# Patient Record
Sex: Female | Born: 1975
Health system: Southern US, Community
[De-identification: ages and names within clinical notes are randomized; demographics above are authoritative.]

## PROBLEM LIST (undated history)

## (undated) DIAGNOSIS — R569 Unspecified convulsions: Secondary | ICD-10-CM

## (undated) DIAGNOSIS — G40909 Epilepsy, unspecified, not intractable, without status epilepticus: Secondary | ICD-10-CM

## (undated) DIAGNOSIS — T7840XA Allergy, unspecified, initial encounter: Secondary | ICD-10-CM

## (undated) DIAGNOSIS — F32A Depression, unspecified: Secondary | ICD-10-CM

## (undated) DIAGNOSIS — F329 Major depressive disorder, single episode, unspecified: Secondary | ICD-10-CM

## (undated) DIAGNOSIS — K219 Gastro-esophageal reflux disease without esophagitis: Secondary | ICD-10-CM

## (undated) DIAGNOSIS — N809 Endometriosis, unspecified: Secondary | ICD-10-CM

## (undated) DIAGNOSIS — F419 Anxiety disorder, unspecified: Secondary | ICD-10-CM

## (undated) DIAGNOSIS — N390 Urinary tract infection, site not specified: Secondary | ICD-10-CM

## (undated) DIAGNOSIS — Z8669 Personal history of other diseases of the nervous system and sense organs: Secondary | ICD-10-CM

## (undated) HISTORY — PX: UMBILICAL HERNIA REPAIR: SHX2598

## (undated) HISTORY — DX: Endometriosis, unspecified: N80.9

## (undated) HISTORY — DX: Anxiety disorder, unspecified: F41.9

## (undated) HISTORY — PX: KNEE SURGERY: SHX244

## (undated) HISTORY — DX: Urinary tract infection, site not specified: N39.0

## (undated) HISTORY — PX: ABDOMINAL HYSTERECTOMY: SHX81

## (undated) HISTORY — DX: Allergy, unspecified, initial encounter: T78.40XA

## (undated) HISTORY — PX: WISDOM TOOTH EXTRACTION: SHX21

## (undated) HISTORY — PX: HERNIA REPAIR: SHX51

---

## 2002-09-05 ENCOUNTER — Emergency Department (HOSPITAL_COMMUNITY): Admission: EM | Admit: 2002-09-05 | Discharge: 2002-09-05 | Payer: Self-pay | Admitting: Emergency Medicine

## 2002-09-05 ENCOUNTER — Encounter: Payer: Self-pay | Admitting: Emergency Medicine

## 2003-03-07 ENCOUNTER — Emergency Department (HOSPITAL_COMMUNITY): Admission: EM | Admit: 2003-03-07 | Discharge: 2003-03-07 | Payer: Self-pay | Admitting: Emergency Medicine

## 2003-12-16 ENCOUNTER — Other Ambulatory Visit: Admission: RE | Admit: 2003-12-16 | Discharge: 2003-12-16 | Payer: Self-pay | Admitting: *Deleted

## 2004-08-08 ENCOUNTER — Emergency Department (HOSPITAL_COMMUNITY): Admission: EM | Admit: 2004-08-08 | Discharge: 2004-08-08 | Payer: Self-pay | Admitting: *Deleted

## 2004-08-18 ENCOUNTER — Encounter: Admission: RE | Admit: 2004-08-18 | Discharge: 2004-11-16 | Payer: Self-pay | Admitting: Family Medicine

## 2005-01-12 ENCOUNTER — Other Ambulatory Visit: Admission: RE | Admit: 2005-01-12 | Discharge: 2005-01-12 | Payer: Self-pay | Admitting: *Deleted

## 2005-03-22 ENCOUNTER — Encounter: Admission: RE | Admit: 2005-03-22 | Discharge: 2005-05-02 | Payer: Self-pay | Admitting: *Deleted

## 2006-01-15 ENCOUNTER — Other Ambulatory Visit: Admission: RE | Admit: 2006-01-15 | Discharge: 2006-01-15 | Payer: Self-pay | Admitting: *Deleted

## 2006-06-24 ENCOUNTER — Ambulatory Visit (HOSPITAL_BASED_OUTPATIENT_CLINIC_OR_DEPARTMENT_OTHER): Admission: RE | Admit: 2006-06-24 | Discharge: 2006-06-24 | Payer: Self-pay | Admitting: Gynecology

## 2006-06-24 HISTORY — PX: YAG LASER APPLICATION: SHX6189

## 2007-03-06 ENCOUNTER — Other Ambulatory Visit: Admission: RE | Admit: 2007-03-06 | Discharge: 2007-03-06 | Payer: Self-pay | Admitting: Family Medicine

## 2008-03-08 ENCOUNTER — Other Ambulatory Visit: Admission: RE | Admit: 2008-03-08 | Discharge: 2008-03-08 | Payer: Self-pay | Admitting: Family Medicine

## 2008-05-25 ENCOUNTER — Encounter: Admission: RE | Admit: 2008-05-25 | Discharge: 2008-05-25 | Payer: Self-pay | Admitting: Gastroenterology

## 2008-11-16 ENCOUNTER — Encounter: Admission: RE | Admit: 2008-11-16 | Discharge: 2008-12-15 | Payer: Self-pay | Admitting: Family Medicine

## 2009-04-14 ENCOUNTER — Other Ambulatory Visit: Admission: RE | Admit: 2009-04-14 | Discharge: 2009-04-14 | Payer: Self-pay | Admitting: Family Medicine

## 2009-11-08 ENCOUNTER — Encounter: Admission: RE | Admit: 2009-11-08 | Discharge: 2009-11-08 | Payer: Self-pay | Admitting: Family Medicine

## 2009-11-17 ENCOUNTER — Ambulatory Visit: Payer: Self-pay | Admitting: Gynecology

## 2009-12-15 ENCOUNTER — Ambulatory Visit (HOSPITAL_COMMUNITY): Admission: RE | Admit: 2009-12-15 | Discharge: 2009-12-15 | Payer: Self-pay | Admitting: Obstetrics and Gynecology

## 2009-12-15 HISTORY — PX: DIAGNOSTIC LAPAROSCOPY: SUR761

## 2010-01-11 ENCOUNTER — Emergency Department (HOSPITAL_COMMUNITY): Admission: EM | Admit: 2010-01-11 | Discharge: 2010-01-12 | Payer: Self-pay | Admitting: Emergency Medicine

## 2010-04-27 ENCOUNTER — Other Ambulatory Visit: Admission: RE | Admit: 2010-04-27 | Discharge: 2010-04-27 | Payer: Self-pay | Admitting: Family Medicine

## 2010-06-26 IMAGING — US US PELVIS COMPLETE MODIFY
1 series · 14 of 25 positions shown · non-contrast
Comparison: CT 11/08/2009

CLINICAL DATA: Abdominal and back pain, evaluate for torsion

TRANSABDOMINAL AND TRANSVAGINAL ULTRASOUND OF PELVIS
DOPPLER ULTRASOUND OF OVARIES
TECHNIQUE: Both transabdominal and transvaginal ultrasound
examinations of the pelvis were performed including evaluation of
the uterus, ovaries, adnexal regions, and pelvic cul-de-sac. Color
and duplex Doppler ultrasound was utilized to evaluate blood flow
to the ovaries.

[Series 1: us pelvis complete modify · 0.28mm/px · 14 of 39 slices shown]
[im 1/39]
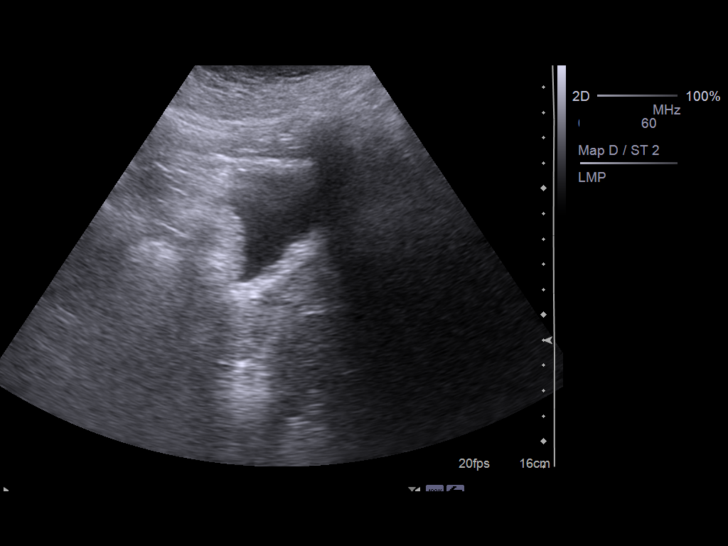
[im 4/39]
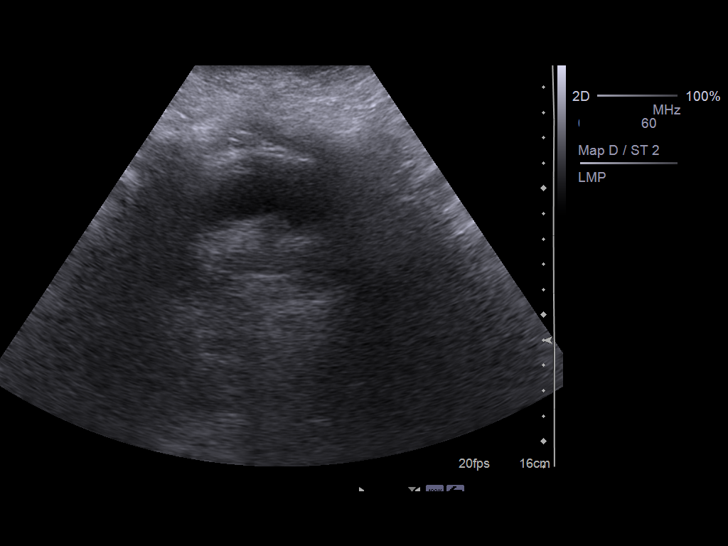
[im 7/39]
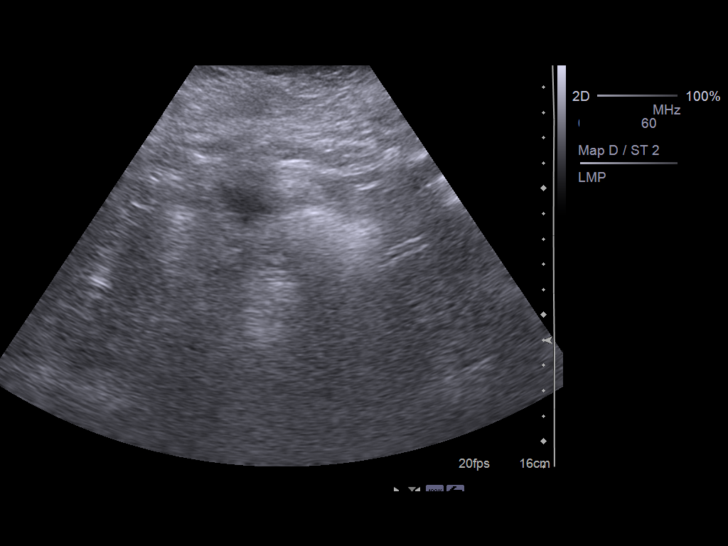
[im 10/39]
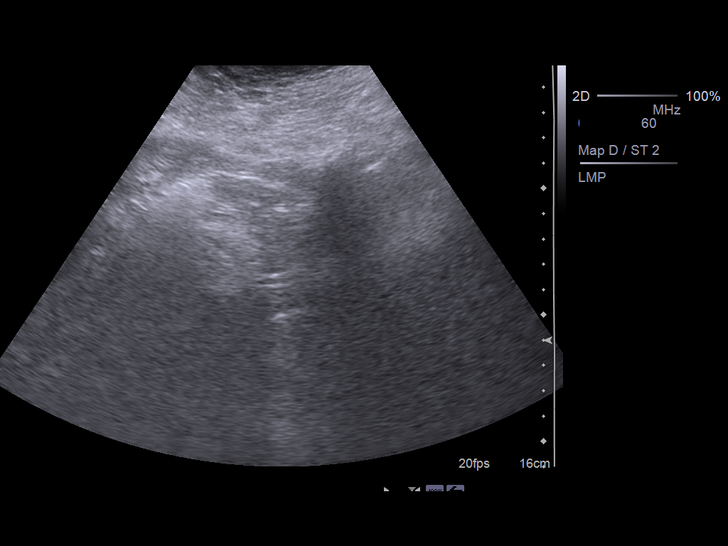
[im 13/39]
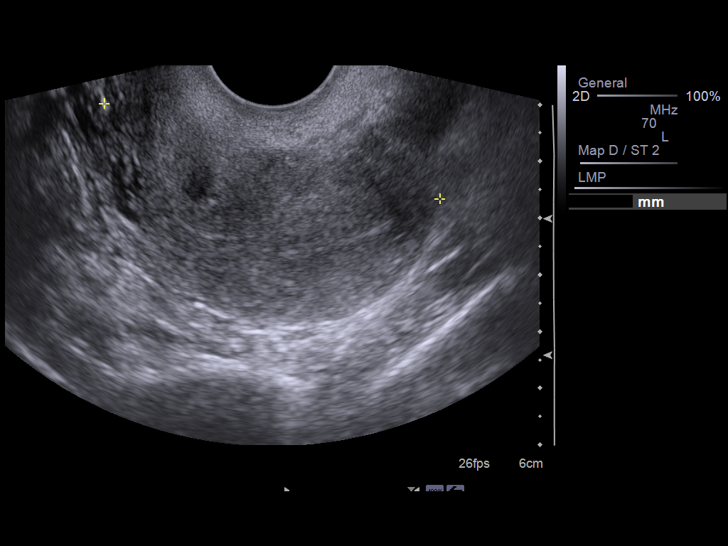
[im 15/39]
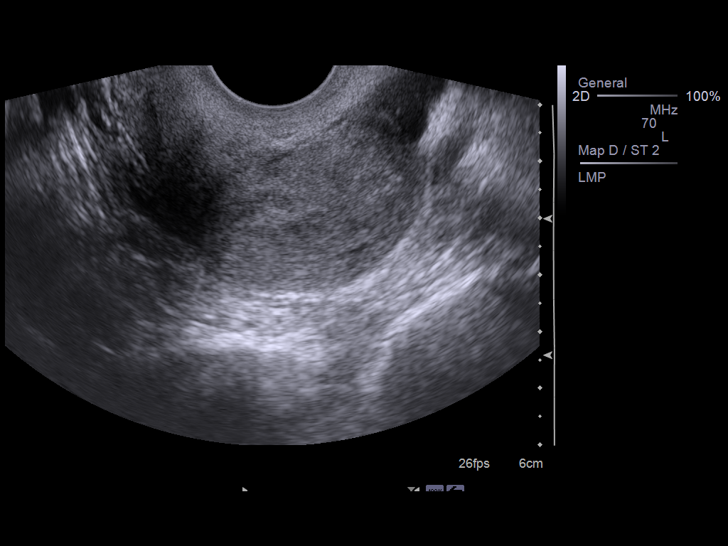
[im 18/39]
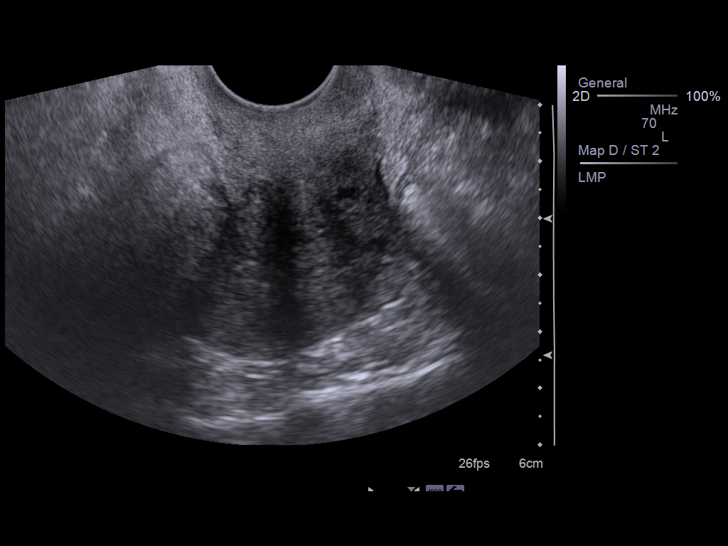
[im 21/39]
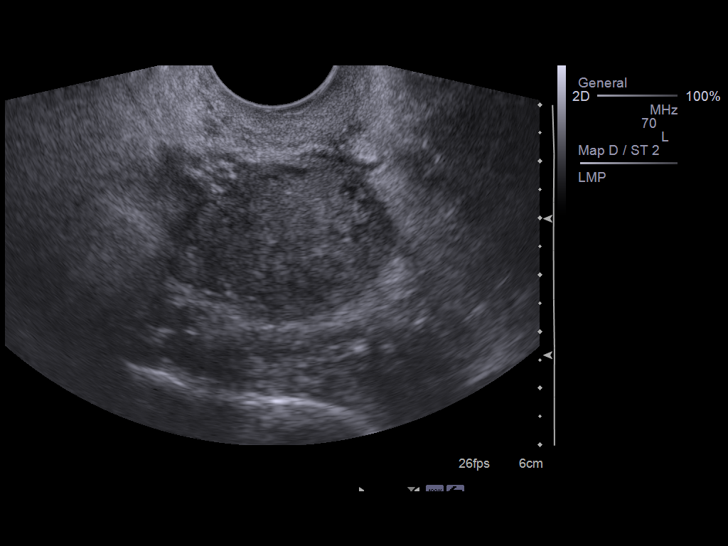
[im 24/39]
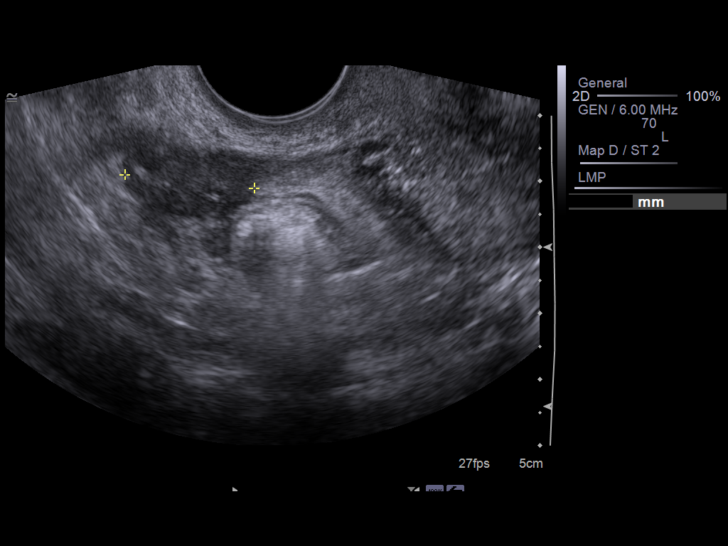
[im 26/39]
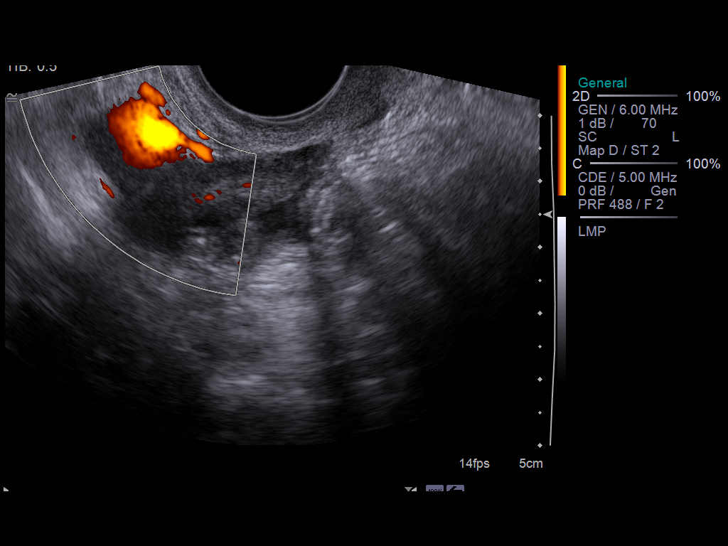
[im 29/39]
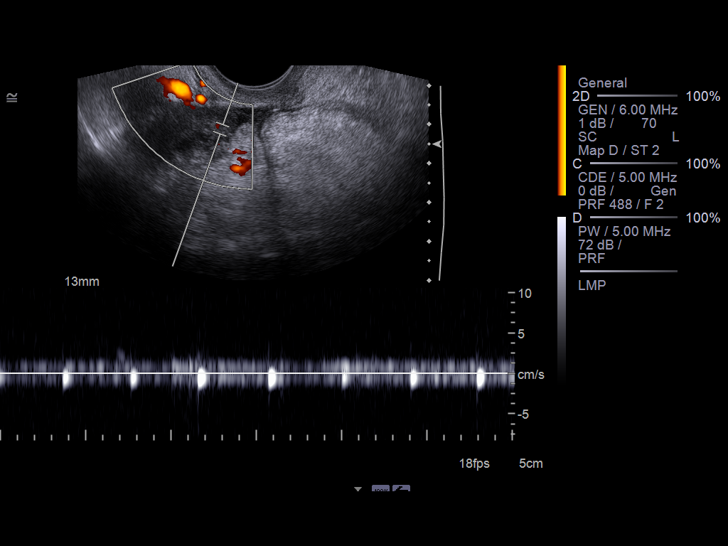
[im 32/39]
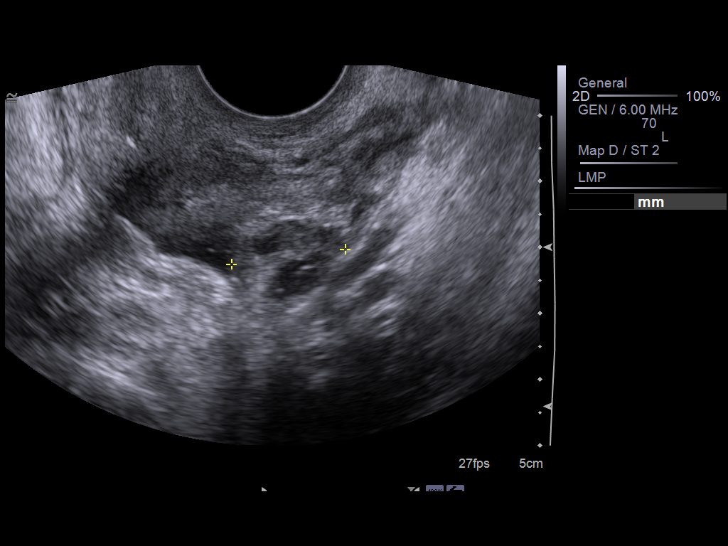
[im 35/39]
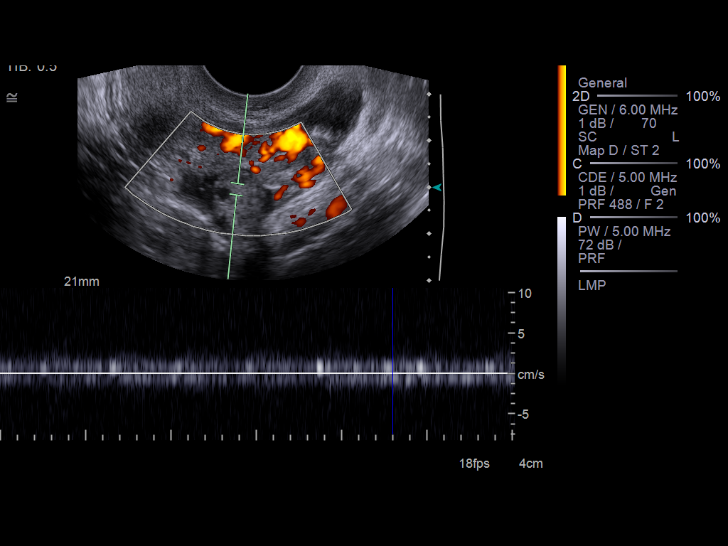
[im 39/39]
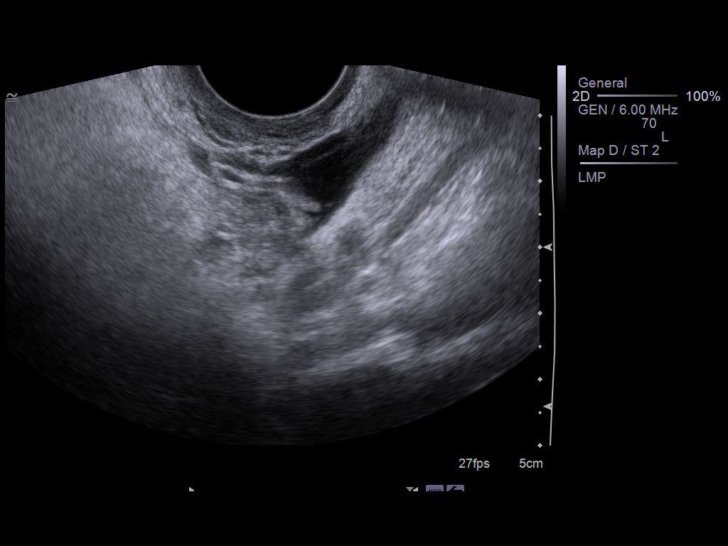

[14 of 25 positions shown; findings below may reference images not displayed]

FINDINGS: Uterus is normal and size and contour measuring 6.8 x 3.3 x 4.3 cm.

Endometrium is uniform and thickness measuring 2 mm

Right Ovary is normal in size measuring 2.7 x 1.5 x 2.0 cm.  Normal
color Doppler flow and arterial and venous wave forms.

Left Ovary is normal in size measuring 2.8 x 1.5 x 1.7 cm.  Normal
color Doppler flow and normal arteriovenous waveforms.

Other Findings:  Mild  amount of pelvic free fluid.
IMPRESSION: 1. No evidence of ovarian torsion.  Normal ovaries.

2.  Mild  amount of free fluid in the pelvis.

## 2010-10-29 HISTORY — PX: ELBOW SURGERY: SHX618

## 2010-11-20 ENCOUNTER — Encounter: Payer: Self-pay | Admitting: Gastroenterology

## 2011-01-18 LAB — HCG, SERUM, QUALITATIVE: Preg, Serum: NEGATIVE

## 2011-01-18 LAB — CBC
HCT: 38.9 % (ref 36.0–46.0)
MCHC: 33.7 g/dL (ref 30.0–36.0)
MCV: 90 fL (ref 78.0–100.0)
Platelets: 308 10*3/uL (ref 150–400)
RBC: 4.32 MIL/uL (ref 3.87–5.11)
RDW: 13.8 % (ref 11.5–15.5)

## 2011-01-18 LAB — ABO/RH: ABO/RH(D): A POS

## 2011-01-21 LAB — DIFFERENTIAL
Eosinophils Absolute: 0.5 10*3/uL (ref 0.0–0.7)
Lymphs Abs: 1.3 10*3/uL (ref 0.7–4.0)
Monocytes Relative: 5 % (ref 3–12)
Neutro Abs: 3.1 10*3/uL (ref 1.7–7.7)
Neutrophils Relative %: 60 % (ref 43–77)

## 2011-01-21 LAB — URINE MICROSCOPIC-ADD ON

## 2011-01-21 LAB — POCT I-STAT, CHEM 8
BUN: 9 mg/dL (ref 6–23)
Chloride: 111 mEq/L (ref 96–112)
Hemoglobin: 12.6 g/dL (ref 12.0–15.0)
Potassium: 3.3 mEq/L — ABNORMAL LOW (ref 3.5–5.1)
Sodium: 138 mEq/L (ref 135–145)
TCO2: 20 mmol/L (ref 0–100)

## 2011-01-21 LAB — HEPATIC FUNCTION PANEL
ALT: 15 U/L (ref 0–35)
AST: 19 U/L (ref 0–37)
Albumin: 3.3 g/dL — ABNORMAL LOW (ref 3.5–5.2)
Alkaline Phosphatase: 71 U/L (ref 39–117)
Indirect Bilirubin: 0.2 mg/dL — ABNORMAL LOW (ref 0.3–0.9)
Total Bilirubin: 0.4 mg/dL (ref 0.3–1.2)
Total Protein: 6.6 g/dL (ref 6.0–8.3)

## 2011-01-21 LAB — CBC
HCT: 38.8 % (ref 36.0–46.0)
MCV: 90.3 fL (ref 78.0–100.0)

## 2011-01-21 LAB — LIPASE, BLOOD: Lipase: 126 U/L — ABNORMAL HIGH (ref 11–59)

## 2011-01-21 LAB — URINALYSIS, ROUTINE W REFLEX MICROSCOPIC
Bilirubin Urine: NEGATIVE
Hgb urine dipstick: NEGATIVE
Protein, ur: NEGATIVE mg/dL
Urobilinogen, UA: 0.2 mg/dL (ref 0.0–1.0)
pH: 5.5 (ref 5.0–8.0)

## 2011-01-21 LAB — URINE CULTURE

## 2011-03-16 NOTE — Op Note (Signed)
NAME:  April Campbell, April Campbell                ACCOUNT NO.:  [id number]   MEDICAL RECORD NO.:  [id number]          PATIENT TYPE:  AMB   LOCATION:  [facility]                         FACILITY:  [facility]   PHYSICIAN:  Ivor Costa. Farrel Gobble, M.D. DATE OF BIRTH:  Sep 29, 1976   DATE OF PROCEDURE:  06/24/2006  DATE OF DISCHARGE:                                 OPERATIVE REPORT   PREOPERATIVE DIAGNOSIS:  Pelvic pain.   POSTOPERATIVE DIAGNOSIS:  Endometriosis.   PROCEDURE:  Laser vaporization of endometriosis with YAG.   SURGEON:  Ivor Costa. Farrel Gobble, M.D.   ANESTHESIA:  General.   ESTIMATED BLOOD LOSS:  Minimal.   COMPLICATIONS:  None.   FINDINGS:  Normal tubes, ovaries, gallbladder, liver and appendix were  appreciated.  The serosa over the uterus anterior cul-de-sac was also  unremarkable.  There was a single site of endometriosis high in the perineum  on the left hand side.  There was also a 6th cluster of endometriosis  posteriorly at the junction of the uterosacral ligament.  There was a marked  deviation in size between the left and right uterosacral ligament with the  right being markedly larger.  There was no further endometriosis, however,  seen.  Both ovaries were mobile with no endometriosis underneath them  either.   PROCEDURE:  Patient was taken to the operating room, general anesthesia was  induced, placed in the dorsal lithotomy position, prepped and draped in the  usual sterile fashion.  A bivalve speculum was placed in the vagina.  The  cervix was visualized, stabilized with a uterine manipulator.  Gloves were  then changed and an infraumbilical incision was made with the scalpel.  The  pelvis was entered with the Opti-Vu under direct visualization, placement in  the pelvis was achieved.  High flow CO2 was placed.  The patient was then  placed in the Trendelenburg position, uterus was elevated and the pelvis was  inspected.  The findings were as above.  Both ureters were visualized and  peristalsis was seen.  The scope was then changed out for the operative  scope through which the 4 mm ball-tip was advanced through the operative  port, the YAG was turned on to 15 watts and then using the ball and holding  the uterus elevated we were able to fulgurate the endometriosis at the  uterosacral ligaments.  The single site on the left peritoneal wall was  touched as well.  There was a very small red bleb on the sidewall on the  right that was similarly treated.  The blunt probe was advanced back through  the operative scope.  We reinspected the pelvis in its entirety, no other  disease was appreciated.  The uterus was deflected anteriorly again to  confirm that there was no other endometriosis seen which was negative.  The  scope was then removed.  The infraumbilical fascia was elevated with  two Allis clamps and closed with #0 Vicryl, the skin was closed with #3-0  plain and then injected with a total of 10 mL of 0.25% Marcaine.  The  patient tolerated the procedure well.  Sponge, lap and needle counts correct  x2 and she was transferred to the PACU in stable condition.      Ivor Costa. Farrel Gobble, M.D.  Electronically Signed     THL/MEDQ  D:  06/24/2006  T:  06/24/2006  Job:  [id number]

## 2011-03-16 NOTE — H&P (Signed)
NAME:  April Campbell, April Campbell                ACCOUNT NO.:  [id number]   MEDICAL RECORD NO.:  [id number]          PATIENT TYPE:   LOCATION:                                 FACILITY:   PHYSICIAN:  Ivor Costa. Farrel Gobble, M.D.      DATE OF BIRTH:   DATE OF ADMISSION:  DATE OF DISCHARGE:                                HISTORY & PHYSICAL   CHIEF COMPLAINT:  Pelvic pain.   HISTORY OF PRESENT ILLNESS:  The patient is a 35 year old P0 who is in  contraception with a vasectomy for approximately 2 years.  The patient  states that she has been with her husband for close to 10 years, but has  developed severe dyspareunia over the last couple of years.  The patient  states that the pain is deep.  It is somewhat associated with dryness, they  do use lubrication, however, does not get any benefit.  She has no  postcoital bleeding.  No throbbing or pain after intercourse. She is  currently using birth control pills for regulation of her cycle.  The  patient states, however, that when her husband first got his vasectomy she  discontinued the birth control pills for about a year and during this time  she began having increasing dyspareunia as well as dysmenorrhea. Despite  restarting the birth control pill, however, her pain has not significantly  improved.  The patient reports a family history of endometriosis in her  mother.  Otherwise the family history is negative and noncontributory.   PAST OB/GYN HISTORY:  Essentially negative.  Normal Pap smears.  There is no  history of sexually transmitted diseases.  Nulliparous by choice.   PAST MEDICAL HISTORY:  Negative.   SURGICAL HISTORY:  The patient had a herniorrhaphy in 1980 as a baby.   MEDICATIONS:  Nuvaring.   ALLERGIES:  Prednisone and Wellbutrin.   SOCIAL HISTORY:  She is married.  Social alcohol.  No tobacco, regular  caffeine, informal exercise.   PHYSICAL EXAMINATION:  GENERAL:  She is well appearing.  HEART:  Regular rate.  LUNGS:  Clear to  auscultation.  ABDOMEN:  Obese, soft, and nontender.  She has normal external female  genitalia.  Her BUS is negative.  Her vagina is pink and moist.  The cervix  is without lesions.  The uterus is mobile and nontender.  There is  attenuation, however, of her uterosacral ligaments right much greater than  her left as well as some tenderness on the right side wall.  There was also  some nodularity noted on the uterosacral ligaments bilaterally.   ASSESSMENT:  Pelvic pain, suspect endometriosis. The patient elected for  laparoscopic evaluation of her pelvis.  The risks and benefits of te  procedure were reviewed with her and accepted.  If the laparoscopy turns out  to be nondiagnostic we may end up referring her to physical therapy which  was agreeable to her.  The risks and benefits were reviewed.  She was given  a prescription for Tylox 1-2 q.6 h. p.r.n. pain for postoperative pain  management.  Ivor Costa. Farrel Gobble, M.D.  Electronically Signed     THL/MEDQ  D:  06/23/2006  T:  06/23/2006  Job:  [id number]

## 2011-11-19 ENCOUNTER — Emergency Department (HOSPITAL_COMMUNITY): Payer: BC Managed Care – PPO

## 2011-11-19 ENCOUNTER — Emergency Department (HOSPITAL_COMMUNITY)
Admission: EM | Admit: 2011-11-19 | Discharge: 2011-11-19 | Disposition: A | Discharge status: Home / Self Care | Payer: BC Managed Care – PPO | Attending: Emergency Medicine | Admitting: Emergency Medicine

## 2011-11-19 ENCOUNTER — Encounter (HOSPITAL_COMMUNITY): Payer: Self-pay | Admitting: Emergency Medicine

## 2011-11-19 DIAGNOSIS — Z79899 Other long term (current) drug therapy: Secondary | ICD-10-CM | POA: Insufficient documentation

## 2011-11-19 DIAGNOSIS — R569 Unspecified convulsions: Secondary | ICD-10-CM

## 2011-11-19 DIAGNOSIS — S01512A Laceration without foreign body of oral cavity, initial encounter: Secondary | ICD-10-CM

## 2011-11-19 DIAGNOSIS — S01502A Unspecified open wound of oral cavity, initial encounter: Secondary | ICD-10-CM | POA: Insufficient documentation

## 2011-11-19 DIAGNOSIS — X58XXXA Exposure to other specified factors, initial encounter: Secondary | ICD-10-CM | POA: Insufficient documentation

## 2011-11-19 DIAGNOSIS — G40909 Epilepsy, unspecified, not intractable, without status epilepticus: Secondary | ICD-10-CM | POA: Insufficient documentation

## 2011-11-19 DIAGNOSIS — S62609A Fracture of unspecified phalanx of unspecified finger, initial encounter for closed fracture: Secondary | ICD-10-CM | POA: Insufficient documentation

## 2011-11-19 HISTORY — DX: Major depressive disorder, single episode, unspecified: F32.9

## 2011-11-19 HISTORY — DX: Depression, unspecified: F32.A

## 2011-11-19 MED ORDER — LEVETIRACETAM 750 MG PO TABS
1500.0000 mg | ORAL_TABLET | Freq: Once | ORAL | Status: AC
  Administered 2011-11-19: 1500 mg via ORAL
  Filled 2011-11-19: qty 2

## 2011-11-19 MED ORDER — LEVETIRACETAM 1000 MG PO TABS: 1000.0000 mg | ORAL_TABLET | Freq: Two times a day (BID) | ORAL | Status: DC

## 2011-11-19 NOTE — ED Provider Notes (Signed)
History     CSN: [id number]  Arrival date & time 11/19/11  4098   First MD Initiated Contact with Patient 11/19/11 2015      Chief Complaint  Patient presents with  . Seizures    ran out of keppra on sat and today grand mal seizure for a few mins , boyfriend was there and she did not hit her head ,alert x4 at this time     (Consider location/radiation/quality/duration/timing/severity/associated sxs/prior treatment) HPI  Patient with history of seizure disorder and on keppra for seizures.  Patient sees neurologist at [facility].  Patient had one grand mal seizure today lasting about one minute at 6 pm.  BF with patient .  Patient bit tongue and jammed finger.  No other complaints except headache which is usual for her after seizures.  Patient out of keppra for 2 days.  RX is normally 1000 mg bid.  Past Medical History  Diagnosis Date  . Seizure   . Depression     History reviewed. No pertinent past surgical history.  No family history on file.  History  Substance Use Topics  . Smoking status: Not on file  . Smokeless tobacco: Not on file  . Alcohol Use:     OB History    Grav Para Term Preterm Abortions TAB SAB Ect Mult Living                  Review of Systems  All other systems reviewed and are negative.    Allergies  Prednisone and Wellbutrin  Home Medications   Current Outpatient Rx  Name Route Sig Dispense Refill  . AMITRIPTYLINE HCL 25 MG PO TABS Oral Take 25 mg by mouth at bedtime.    . ARIPIPRAZOLE 2 MG PO TABS Oral Take 2 mg by mouth daily.    . DULOXETINE HCL 60 MG PO CPEP Oral Take 60 mg by mouth daily.    Marland Kitchen GABAPENTIN 100 MG PO CAPS Oral Take 200 mg by mouth 2 (two) times daily.    Marland Kitchen LEVETIRACETAM 1000 MG PO TABS Oral Take 1,000 mg by mouth 2 (two) times daily.    Marland Kitchen PHENTERMINE HCL 37.5 MG PO TABS Oral Take 37.5 mg by mouth daily before breakfast.    . POTASSIUM GLUCONATE PO Oral Take 1 tablet by mouth daily.    Marland Kitchen VITAMIN C 500 MG PO TABS Oral  Take 500 mg by mouth daily.      BP 133/90  Pulse 81  Temp(Src) 97.9 F (36.6 C) (Oral)  Resp 16  SpO2 100%  Physical Exam  Nursing note and vitals reviewed. Constitutional: She is oriented to [place], place, and time. She appears well-developed and well-nourished.  HENT:  Head: Normocephalic and atraumatic.  Right Ear: External ear normal.  Left Ear: External ear normal.       Abrasion right side of tongue  Eyes: Conjunctivae and EOM are normal. Pupils are equal, round, and reactive to light.  Neck: Normal range of motion. Neck supple.  Cardiovascular: Normal rate, regular rhythm and normal heart sounds.        Rate 72   Pulmonary/Chest: Effort normal and breath sounds normal.  Abdominal: Soft. Bowel sounds are normal.  Musculoskeletal: Normal range of motion.       Tender pip joint 3rd digt right hand  Neurological: She is alert and oriented to [place], place, and time. She has normal reflexes.  Skin: Skin is warm and dry.  Psychiatric: She has a normal mood  and affect.    ED Course  Procedures (including critical care time)  Labs Reviewed - No data to display No results found.   No diagnosis found.    MDM  Dg Finger Middle Right  11/19/2011  *RADIOLOGY REPORT*  Clinical Data: Third digit pain after seizure  RIGHT MIDDLE FINGER 2+V  Comparison: None.  Findings:  There is a small avulsion fracture of the base of the middle phalanx of the middle (third) digit) worrisome for a volar plate injury.  There is likely extension of the fracture into the adjacent PIP joint space.  No significant adjacent soft tissue swelling.  No additional fractures are identified.  Prominent nutrient foramens are seen within the radial aspect of the proximal phalanx of the third and fifth digits. A geographic area of increased sclerosis within the distal aspect of the radius favored to represent a bone island.  Visualized joint spaces are grossly preserved.  IMPRESSION: Small avulsion fracture of  the volar plate of the base of the middle phalanx of the third (middle) digit with likely extension to the PIP joint.  Original Report Authenticated By: Waynard Reeds, M.D.         Hilario Quarry, MD 11/21/11 [phone]

## 2011-11-19 NOTE — ED Notes (Signed)
Pt has been out of seizure meds (Levetiracetam) since Saturday.  Pt had a seizure at 1800.  Pt is post ictal, no acute distress.  Doesn't remember if she hit her head, st's she has a headache.  Airway clear, respiratory rate/pattern WDL.

## 2013-01-27 ENCOUNTER — Encounter (HOSPITAL_COMMUNITY): Payer: Self-pay | Admitting: *Deleted

## 2013-01-27 ENCOUNTER — Other Ambulatory Visit: Payer: Self-pay | Admitting: Gastroenterology

## 2013-01-28 ENCOUNTER — Encounter (HOSPITAL_COMMUNITY): Payer: Self-pay | Admitting: *Deleted

## 2013-02-02 ENCOUNTER — Encounter (HOSPITAL_COMMUNITY): Payer: Self-pay | Admitting: Pharmacy Technician

## 2013-02-10 ENCOUNTER — Encounter (HOSPITAL_COMMUNITY): Payer: Self-pay | Admitting: *Deleted

## 2013-02-10 ENCOUNTER — Ambulatory Visit (HOSPITAL_COMMUNITY): Payer: BC Managed Care – PPO | Admitting: Anesthesiology

## 2013-02-10 ENCOUNTER — Ambulatory Visit (HOSPITAL_COMMUNITY)
Admission: RE | Admit: 2013-02-10 | Discharge: 2013-02-10 | Disposition: A | Discharge status: Home / Self Care | Payer: BC Managed Care – PPO | Source: Ambulatory Visit | Attending: Gastroenterology | Admitting: Gastroenterology

## 2013-02-10 ENCOUNTER — Encounter (HOSPITAL_COMMUNITY): Payer: Self-pay | Admitting: Anesthesiology

## 2013-02-10 ENCOUNTER — Encounter (HOSPITAL_COMMUNITY)
Admission: RE | Disposition: A | Discharge status: Home / Self Care | Payer: Self-pay | Source: Ambulatory Visit | Attending: Gastroenterology

## 2013-02-10 DIAGNOSIS — G40909 Epilepsy, unspecified, not intractable, without status epilepticus: Secondary | ICD-10-CM | POA: Insufficient documentation

## 2013-02-10 DIAGNOSIS — J309 Allergic rhinitis, unspecified: Secondary | ICD-10-CM | POA: Insufficient documentation

## 2013-02-10 DIAGNOSIS — K625 Hemorrhage of anus and rectum: Secondary | ICD-10-CM | POA: Insufficient documentation

## 2013-02-10 DIAGNOSIS — IMO0002 Reserved for concepts with insufficient information to code with codable children: Secondary | ICD-10-CM | POA: Insufficient documentation

## 2013-02-10 DIAGNOSIS — E669 Obesity, unspecified: Secondary | ICD-10-CM | POA: Insufficient documentation

## 2013-02-10 DIAGNOSIS — K589 Irritable bowel syndrome without diarrhea: Secondary | ICD-10-CM | POA: Insufficient documentation

## 2013-02-10 DIAGNOSIS — F341 Dysthymic disorder: Secondary | ICD-10-CM | POA: Insufficient documentation

## 2013-02-10 DIAGNOSIS — G43909 Migraine, unspecified, not intractable, without status migrainosus: Secondary | ICD-10-CM | POA: Insufficient documentation

## 2013-02-10 DIAGNOSIS — Z6834 Body mass index (BMI) 34.0-34.9, adult: Secondary | ICD-10-CM | POA: Insufficient documentation

## 2013-02-10 DIAGNOSIS — Z79899 Other long term (current) drug therapy: Secondary | ICD-10-CM | POA: Insufficient documentation

## 2013-02-10 DIAGNOSIS — K219 Gastro-esophageal reflux disease without esophagitis: Secondary | ICD-10-CM | POA: Insufficient documentation

## 2013-02-10 DIAGNOSIS — Z888 Allergy status to other drugs, medicaments and biological substances status: Secondary | ICD-10-CM | POA: Insufficient documentation

## 2013-02-10 HISTORY — PX: COLONOSCOPY WITH PROPOFOL: SHX5780

## 2013-02-10 HISTORY — DX: Gastro-esophageal reflux disease without esophagitis: K21.9

## 2013-02-10 SURGERY — COLONOSCOPY WITH PROPOFOL
Anesthesia: Monitor Anesthesia Care

## 2013-02-10 MED ORDER — LIDOCAINE HCL (CARDIAC) 20 MG/ML IV SOLN
INTRAVENOUS | Status: DC | PRN
  Administered 2013-02-10: 50 mg via INTRAVENOUS

## 2013-02-10 MED ORDER — MIDAZOLAM HCL 5 MG/5ML IJ SOLN
INTRAMUSCULAR | Status: DC | PRN
  Administered 2013-02-10: 2 mg via INTRAVENOUS

## 2013-02-10 MED ORDER — PROPOFOL 10 MG/ML IV EMUL
INTRAVENOUS | Status: DC | PRN
  Administered 2013-02-10: 75 ug/kg/min via INTRAVENOUS

## 2013-02-10 MED ORDER — SODIUM CHLORIDE 0.9 % IV SOLN: INTRAVENOUS | Status: DC

## 2013-02-10 MED ORDER — LACTATED RINGERS IV SOLN
INTRAVENOUS | Status: DC
  Administered 2013-02-10: 1000 mL via INTRAVENOUS

## 2013-02-10 SURGICAL SUPPLY — 22 items

## 2013-02-10 NOTE — Transfer of Care (Signed)
Immediate Anesthesia Transfer of Care Note  Patient: April Campbell  Procedure(s) Performed: Procedure(s): COLONOSCOPY WITH PROPOFOL (N/A)  Patient Location: PACU  Anesthesia Type:MAC  Level of Consciousness: awake, patient cooperative and responds to stimulation  Airway & Oxygen Therapy: Patient Spontanous Breathing and Patient connected to face mask oxygen  Post-op Assessment: Report given to PACU RN, Post -op Vital signs reviewed and stable and Patient moving all extremities  Post vital signs: Reviewed and stable  Complications: No apparent anesthesia complications

## 2013-02-10 NOTE — Anesthesia Postprocedure Evaluation (Signed)
Anesthesia Post Note  Patient: April Campbell  Procedure(s) Performed: Procedure(s) (LRB): COLONOSCOPY WITH PROPOFOL (N/A)  Anesthesia type: MAC  Patient location: PACU  Post pain: Pain level controlled  Post assessment: Post-op Vital signs reviewed  Last Vitals: BP 127/90  Temp(Src) 36.7 C (Oral)  Resp 29  Ht 5\' 7"  (1.702 m)  Wt 220 lb (99.791 kg)  BMI 34.45 kg/m2  SpO2 98%  LMP 02/08/2013  Post vital signs: Reviewed  Level of consciousness: awake  Complications: No apparent anesthesia complications

## 2013-02-10 NOTE — Op Note (Signed)
Procedure: Diagnostic colonoscopy to evaluate painless hematochezia  Endoscopist: Danise Edge  Premedication: Propofol administered by anesthesia  Procedure: The patient was placed in the left lateral decubitus position. Anal inspection and digital rectal exam were normal. The Pentax pediatric colonoscope was introduced into the rectum and advanced to the cecum. A normal-appearing ileocecal valve was intubated and the distal ileum inspected. Colonic preparation for the exam today was good.  Rectum. Normal. Retroflexed view of the distal rectum normal.  Sigmoid colon and descending colon. Normal.  Splenic flexure. Normal.  Transverse colon. Normal.  Hepatic flexure. Normal.  Ascending colon. Normal.  Cecum and ileocecal valve. Normal.  Terminal ileum. Normal.  Assessment: Normal diagnostic proctocolonoscopy to the cecum with inspection of the terminal ileum.  Recommendations: Schedule screening colonoscopy at age 5.

## 2013-02-10 NOTE — Anesthesia Preprocedure Evaluation (Addendum)
Anesthesia Evaluation  Patient identified by MRN, date of birth, ID band Patient awake    Reviewed: Allergy & Precautions, H&P , NPO status , Patient's Chart, lab work & pertinent test results  Airway Mallampati: I TM Distance: >3 FB Neck ROM: Full    Dental  (+) Dental Advisory Given and Teeth Intact   Pulmonary neg pulmonary ROS,  breath sounds clear to auscultation  Pulmonary exam normal       Cardiovascular negative cardio ROS  Rhythm:Regular Rate:Normal     Neuro/Psych Seizures -, Well Controlled,  PSYCHIATRIC DISORDERS Depression    GI/Hepatic Neg liver ROS, GERD-  Controlled and Medicated,  Endo/Other  negative endocrine ROS  Renal/GU negative Renal ROS     Musculoskeletal negative musculoskeletal ROS (+)   Abdominal (+) + obese,   Peds  Hematology negative hematology ROS (+)   Anesthesia Other Findings   Reproductive/Obstetrics negative OB ROS                          Anesthesia Physical Anesthesia Plan  ASA: II  Anesthesia Plan: MAC   Post-op Pain Management:    Induction: Intravenous  Airway Management Planned:   Additional Equipment:   Intra-op Plan:   Post-operative Plan:   Informed Consent: I have reviewed the patients History and Physical, chart, labs and discussed the procedure including the risks, benefits and alternatives for the proposed anesthesia with the patient or authorized representative who has indicated his/her understanding and acceptance.   Dental advisory given  Plan Discussed with: CRNA  Anesthesia Plan Comments:         Anesthesia Quick Evaluation

## 2013-02-10 NOTE — H&P (Signed)
  Problem: Hematochezia with normal CBC and sedimentation rate  History: The patient is a 37 year old female born August 10, 1976. For approximately 5 days the patient has experienced small-volume painless hematochezia unassociated with severe constipation, anal pain, or abdominal pain. The fresh blood has been mixed in the stool. The patient denies diarrhea or the passage of excessive mucus with her stool.  The patient is scheduled to undergo a diagnostic colonoscopy.  Medication allergies: Prednisone causes flushing, Wellbutrin causes seizure, Lamictal causes rash, Lexapro causes decreased libido.  Chronic medications: Prep. Cymbalta. Multivitamin. Amitriptyline. Senokot. Zyrtec. Relpax. Abilify. Neurontin. MiraLAX.  Past medical and surgical history: Endometriosis. Irritable bowel syndrome. Allergic rhinitis. Depression with anxiety. Epilepsy. Dyspareunia. Migraine headache syndrome. Laparoscopic therapy for endometriosis. Umbilical hernia repair at age 69. Ulnar nerve debridement surgery.  Habits: The patient has never smoked cigarettes. She consumes alcohol in moderation.  Exam: The patient is alert and lying comfortably on the endoscopy stretcher. Lungs are clear to auscultation. Cardiac exam reveals a regular rhythm. Abdomen is soft, flat nontender to palpation in all quadrants.  Plan: Proceed with diagnostic colonoscopy to evaluate painless hematochezia.

## 2013-02-11 ENCOUNTER — Encounter (HOSPITAL_COMMUNITY): Payer: Self-pay | Admitting: Gastroenterology

## 2013-06-25 ENCOUNTER — Ambulatory Visit: Payer: Self-pay | Admitting: Certified Nurse Midwife

## 2013-06-30 ENCOUNTER — Encounter: Payer: Self-pay | Admitting: Certified Nurse Midwife

## 2013-07-01 ENCOUNTER — Encounter: Payer: Self-pay | Admitting: Certified Nurse Midwife

## 2013-07-01 ENCOUNTER — Ambulatory Visit (INDEPENDENT_AMBULATORY_CARE_PROVIDER_SITE_OTHER): Payer: BC Managed Care – PPO | Admitting: Certified Nurse Midwife

## 2013-07-01 VITALS — BP 106/70 | HR 68 | Resp 16 | Ht 65.5 in | Wt 202.0 lb

## 2013-07-01 DIAGNOSIS — N946 Dysmenorrhea, unspecified: Secondary | ICD-10-CM

## 2013-07-01 DIAGNOSIS — Z01419 Encounter for gynecological examination (general) (routine) without abnormal findings: Secondary | ICD-10-CM

## 2013-07-01 DIAGNOSIS — Z0000 Encounter for general adult medical examination without abnormal findings: Secondary | ICD-10-CM

## 2013-07-01 DIAGNOSIS — N920 Excessive and frequent menstruation with regular cycle: Secondary | ICD-10-CM

## 2013-07-01 LAB — HEMOGLOBIN, FINGERSTICK: Hemoglobin, fingerstick: 13.6 g/dL (ref 12.0–16.0)

## 2013-07-01 LAB — POCT URINALYSIS DIPSTICK
Bilirubin, UA: NEGATIVE
Blood, UA: NEGATIVE
Glucose, UA: NEGATIVE
Leukocytes, UA: NEGATIVE
Nitrite, UA: NEGATIVE

## 2013-07-01 MED ORDER — LEVONORGEST-ETH ESTRAD 91-DAY 0.15-0.03 MG PO TABS: 1.0000 | ORAL_TABLET | Freq: Every day | ORAL | Status: DC

## 2013-07-01 NOTE — Progress Notes (Signed)
37 y.o. G0P0000 Married Caucasian Fe here for annual exam.  Periods heavier again. Desires cycle control with Seasonale, worked well in past. No STD screening needed.Working on weight loss. No health issues today.  Patient's last menstrual period was 06/13/2013.          Sexually active: yes  The current method of family planning is vasectomy.    Exercising: no  exercise Smoker:  no  Health Maintenance: Pap:  05-14-11 neg MMG:  none Colonoscopy:  2014 BMD:   none TDaP:  Unsure per patient Labs: Poct urine-neg, Hgb-13.6 Self breast exam: done monthly   reports that she has never smoked. She has never used smokeless tobacco. She reports that she drinks about 1.0 ounces of alcohol per week. She reports that she does not use illicit drugs.  Past Medical History  Diagnosis Date  . GERD (gastroesophageal reflux disease)      over counter medication  . Depression   . Seizure     last 11/19/2011  . Constipation     with rectal bleeding  . Migraines     no aura  . Anxiety   . Endometriosis     Past Surgical History  Procedure Laterality Date  . Elbow surgery  2012  . Hernia repair      baby  . Laparoscopic endometriosis fulguration  2007, 2011  . Colonoscopy with propofol N/A 02/10/2013    Procedure: COLONOSCOPY WITH PROPOFOL;  Surgeon: Charolett Bumpers, MD;  Location: [facility];  Service: Endoscopy;  Laterality: N/A;    Current Outpatient Prescriptions  Medication Sig Dispense Refill  . amitriptyline (ELAVIL) 50 MG tablet Take 100 mg by mouth at bedtime.      . ARIPiprazole (ABILIFY) 2 MG tablet Take 2 mg by mouth daily.      . DULoxetine (CYMBALTA) 60 MG capsule Take 60 mg by mouth daily.      Marland Kitchen gabapentin (NEURONTIN) 600 MG tablet Take 600 mg by mouth daily.      Marland Kitchen ibuprofen (ADVIL,MOTRIN) 200 MG tablet Take 200 mg by mouth every 6 (six) hours as needed for pain.      Marland Kitchen lamoTRIgine (LAMICTAL) 100 MG tablet Take 100 mg by mouth daily.      Marland Kitchen levETIRAcetam (KEPPRA) 1000 MG  tablet Take 1,000 mg by mouth 2 (two) times daily.      Marland Kitchen loratadine (CLARITIN) 10 MG tablet Take 10 mg by mouth daily.      . Multiple Vitamins-Minerals (MULTIVITAMIN PO) Take by mouth daily.      Marland Kitchen POTASSIUM GLUCONATE PO Take 1 tablet by mouth daily.      . ranitidine (ZANTAC) 150 MG tablet Take 150 mg by mouth 2 (two) times daily.      . vitamin C (ASCORBIC ACID) 500 MG tablet Take 500 mg by mouth daily.       No current facility-administered medications for this visit.    Family History  Problem Relation Age of Onset  . Hypertension Mother   . Diabetes Mother   . Hypertension Maternal Grandmother   . Diabetes Maternal Grandmother     ROS:  Pertinent items are noted in HPI.  Otherwise, a comprehensive ROS was negative.  Exam:   Ht 5' 5.5" (1.664 m)  Wt 202 lb (91.627 kg)  BMI 33.09 kg/m2  LMP 06/13/2013 Height: 5' 5.5" (166.4 cm)  Ht Readings from Last 3 Encounters:  07/01/13 5' 5.5" (1.664 m)  02/10/13 5\' 7"  (1.702 m)  02/10/13 5\' 7"  (  1.702 m)    General appearance: alert, cooperative and appears stated age Head: Normocephalic, without obvious abnormality, atraumatic Neck: no adenopathy, supple, symmetrical, trachea midline and thyroid normal to inspection and palpation Lungs: clear to auscultation bilaterally Breasts: normal appearance, no masses or tenderness, No nipple retraction or dimpling, No nipple discharge or bleeding, No axillary or supraclavicular adenopathy Heart: regular rate and rhythm Abdomen: soft, non-tender; no masses,  no organomegaly Extremities: extremities normal, atraumatic, no cyanosis or edema Skin: Skin color, texture, turgor normal. No rashes or lesions Lymph nodes: Cervical, supraclavicular, and axillary nodes normal. No abnormal inguinal nodes palpated Neurologic: Grossly normal   Pelvic: External genitalia:  no lesions              Urethra:  normal appearing urethra with no masses, tenderness or lesions              Bartholin's and  Skene's: normal                 Vagina: normal appearing vagina with normal color and discharge, no lesions              Cervix: normal non tender              Pap taken: yes Bimanual Exam:  Uterus:  normal size, contour, position, consistency, mobility, non-tender and anteflexed              Adnexa: normal adnexa and no mass, fullness, tenderness               Rectovaginal: Confirms               Anus:  normal sphincter tone, no lesions  A:  Well Woman with normal exam  History of dysmenorrhea and menorrhagia, desires cycle control with OCP    P:   Reviewed health and wellness pertinent to exam  Rx Seasonale see order  Pap smear as per guidelines   pap smear taken today with HPVHR  counseled on breast self exam, STD prevention, use and side effects of OCP's, adequate intake of calcium and vitamin D, diet and exercise  return annually or prn  An After Visit Summary was printed and given to the patient.

## 2013-07-01 NOTE — Patient Instructions (Addendum)
General topics  Next pap or exam is  due in 1 year Take a Women's multivitamin Take 1200 mg. of calcium daily - prefer dietary If any concerns in interim to call back  Breast Self-Awareness Practicing breast self-awareness may pick up problems early, prevent significant medical complications, and possibly save your life. By practicing breast self-awareness, you can become familiar with how your breasts look and feel and if your breasts are changing. This allows you to notice changes early. It can also offer you some reassurance that your breast health is good. One way to learn what is normal for your breasts and whether your breasts are changing is to do a breast self-exam. If you find a lump or something that was not present in the past, it is best to contact your caregiver right away. Other findings that should be evaluated by your caregiver include nipple discharge, especially if it is bloody; skin changes or reddening; areas where the skin seems to be pulled in (retracted); or new lumps and bumps. Breast pain is seldom associated with cancer (malignancy), but should also be evaluated by a caregiver. BREAST SELF-EXAM The best time to examine your breasts is 5 7 days after your menstrual period is over.  ExitCare Patient Information 2013 [employer].   Exercise to Stay Healthy Exercise helps you become and stay healthy. EXERCISE IDEAS AND TIPS Choose exercises that:  You enjoy.  Fit into your day. You do not need to exercise really hard to be healthy. You can do exercises at a slow or medium level and stay healthy. You can:  Stretch before and after working out.  Try yoga, Pilates, or tai chi.  Lift weights.  Walk fast, swim, jog, run, climb stairs, bicycle, dance, or rollerskate.  Take aerobic classes. Exercises that burn about 150 calories:  Running 1  miles in 15 minutes.  Playing volleyball for 45 to 60 minutes.  Washing and waxing a car for 45 to 60  minutes.  Playing touch football for 45 minutes.  Walking 1  miles in 35 minutes.  Pushing a stroller 1  miles in 30 minutes.  Playing basketball for 30 minutes.  Raking leaves for 30 minutes.  Bicycling 5 miles in 30 minutes.  Walking 2 miles in 30 minutes.  Dancing for 30 minutes.  Shoveling snow for 15 minutes.  Swimming laps for 20 minutes.  Walking up stairs for 15 minutes.  Bicycling 4 miles in 15 minutes.  Gardening for 30 to 45 minutes.  Jumping rope for 15 minutes.  Washing windows or floors for 45 to 60 minutes. Document Released: 11/17/2010 Document Revised: 01/07/2012 Document Reviewed: 11/17/2010 ExitCare Patient Information 2013 [employer].   Other topics ( that may be useful information):    Sexually Transmitted Disease Sexually transmitted disease (STD) refers to any infection that is passed from [place] to [place] during sexual activity. This may happen by way of saliva, semen, blood, vaginal mucus, or urine. Common STDs include:  Gonorrhea.  Chlamydia.  Syphilis.  HIV/AIDS.  Genital herpes.  Hepatitis B and C.  Trichomonas.  Human papillomavirus (HPV).  Pubic lice. CAUSES  An STD may be spread by bacteria, virus, or parasite. A Farace can get an STD by:  Sexual intercourse with an infected [place].  Sharing sex toys with an infected [place].  Sharing needles with an infected [place].  Having intimate contact with the genitals, mouth, or rectal areas of an infected [place]. SYMPTOMS  Some people may not have any symptoms, but   they can still pass the infection to others. Different STDs have different symptoms. Symptoms include:  Painful or bloody urination.  Pain in the pelvis, abdomen, vagina, anus, throat, or eyes.  Skin rash, itching, irritation, growths, or sores (lesions). These usually occur in the genital or anal area.  Abnormal vaginal discharge.  Penile discharge in men.  Soft, flesh-colored skin growths in the  genital or anal area.  Fever.  Pain or bleeding during sexual intercourse.  Swollen glands in the groin area.  Yellow skin and eyes (jaundice). This is seen with hepatitis. DIAGNOSIS  To make a diagnosis, your caregiver may:  Take a medical history.  Perform a physical exam.  Take a specimen (culture) to be examined.  Examine a sample of discharge under a microscope.  Perform blood test TREATMENT   Chlamydia, gonorrhea, trichomonas, and syphilis can be cured with antibiotic medicine.  Genital herpes, hepatitis, and HIV can be treated, but not cured, with prescribed medicines. The medicines will lessen the symptoms.  Genital warts from HPV can be treated with medicine or by freezing, burning (electrocautery), or surgery. Warts may come back.  HPV is a virus and cannot be cured with medicine or surgery.However, abnormal areas may be followed very closely by your caregiver and may be removed from the cervix, vagina, or vulva through office procedures or surgery. If your diagnosis is confirmed, your recent sexual partners need treatment. This is true even if they are symptom-free or have a negative culture or evaluation. They should not have sex until their caregiver says it is okay. HOME CARE INSTRUCTIONS  All sexual partners should be informed, tested, and treated for all STDs.  Take your antibiotics as directed. Finish them even if you start to feel better.  Only take over-the-counter or prescription medicines for pain, discomfort, or fever as directed by your caregiver.  Rest.  Eat a balanced diet and drink enough fluids to keep your urine clear or pale yellow.  Do not have sex until treatment is completed and you have followed up with your caregiver. STDs should be checked after treatment.  Keep all follow-up appointments, Pap tests, and blood tests as directed by your caregiver.  Only use latex condoms and water-soluble lubricants during sexual activity. Do not use  petroleum jelly or oils.  Avoid alcohol and illegal drugs.  Get vaccinated for HPV and hepatitis. If you have not received these vaccines in the past, talk to your caregiver about whether one or both might be right for you.  Avoid risky sex practices that can break the skin. The only way to avoid getting an STD is to avoid all sexual activity.Latex condoms and dental dams (for oral sex) will help lessen the risk of getting an STD, but will not completely eliminate the risk. SEEK MEDICAL CARE IF:   You have a fever.  You have any new or worsening symptoms. Document Released: 01/05/2003 Document Revised: 01/07/2012 Document Reviewed: 01/12/2011 ExitCare Patient Information 2013 [employer].    Domestic Abuse You are being battered or abused if someone close to you hits, pushes, or physically hurts you in any way. You also are being abused if you are forced into activities. You are being sexually abused if you are forced to have sexual contact of any kind. You are being emotionally abused if you are made to feel worthless or if you are constantly threatened. It is important to remember that help is available. No one has the right to abuse you. PREVENTION OF FURTHER   ABUSE  Learn the warning signs of danger. This varies with situations but may include: the use of alcohol, threats, isolation from friends and family, or forced sexual contact. Leave if you feel that violence is going to occur.  If you are attacked or beaten, report it to the police so the abuse is documented. You do not have to press charges. The police can protect you while you or the attackers are leaving. Get the officer's name and badge number and a copy of the report.  Find someone you can trust and tell them what is happening to you: your caregiver, a nurse, clergy member, close friend or family member. Feeling ashamed is natural, but remember that you have done nothing wrong. No one deserves abuse. Document Released:  10/12/2000 Document Revised: 01/07/2012 Document Reviewed: 12/21/2010 ExitCare Patient Information 2013 [employer].    How Much is Too Much Alcohol? Drinking too much alcohol can cause injury, accidents, and health problems. These types of problems can include:   Car crashes.  Falls.  Family fighting (domestic violence).  Drowning.  Fights.  Injuries.  Burns.  Damage to certain organs.  Having a baby with birth defects. ONE DRINK CAN BE TOO MUCH WHEN YOU ARE:  Working.  Pregnant or breastfeeding.  Taking medicines. Ask your doctor.  Driving or planning to drive. If you or someone you know has a drinking problem, get help from a doctor.  Document Released: 08/11/2009 Document Revised: 01/07/2012 Document Reviewed: 08/11/2009 ExitCare Patient Information 2013 [employer].   Smoking Hazards Smoking cigarettes is extremely bad for your health. Tobacco smoke has over 200 known poisons in it. There are over 60 chemicals in tobacco smoke that cause cancer. Some of the chemicals found in cigarette smoke include:   Cyanide.  Benzene.  Formaldehyde.  Methanol (wood alcohol).  Acetylene (fuel used in welding torches).  Ammonia. Cigarette smoke also contains the poisonous gases nitrogen oxide and carbon monoxide.  Cigarette smokers have an increased risk of many serious medical problems and Smoking causes approximately:  90% of all lung cancer deaths in men.  80% of all lung cancer deaths in women.  90% of deaths from chronic obstructive lung disease. Compared with nonsmokers, smoking increases the risk of:  Coronary heart disease by 2 to 4 times.  Stroke by 2 to 4 times.  Men developing lung cancer by 23 times.  Women developing lung cancer by 13 times.  Dying from chronic obstructive lung diseases by 12 times.  . Smoking is the most preventable cause of death and disease in our society.  WHY IS SMOKING ADDICTIVE?  Nicotine is the chemical  agent in tobacco that is capable of causing addiction or dependence.  When you smoke and inhale, nicotine is absorbed rapidly into the bloodstream through your lungs. Nicotine absorbed through the lungs is capable of creating a powerful addiction. Both inhaled and non-inhaled nicotine may be addictive.  Addiction studies of cigarettes and spit tobacco show that addiction to nicotine occurs mainly during the teen years, when young people begin using tobacco products. WHAT ARE THE BENEFITS OF QUITTING?  There are many health benefits to quitting smoking.   Likelihood of developing cancer and heart disease decreases. Health improvements are seen almost immediately.  Blood pressure, pulse rate, and breathing patterns start returning to normal soon after quitting. QUITTING SMOKING   American Lung Association - 1-800-LUNGUSA  American Cancer Society - 1-800-ACS-2345 Document Released: 11/22/2004 Document Revised: 01/07/2012 Document Reviewed: 07/27/2009 ExitCare Patient Information 2013 ExitCare[employer]   LLC.   Stress Management Stress is a state of physical or mental tension that often results from changes in your life or normal routine. Some common causes of stress are:  Death of a loved one.  Injuries or severe illnesses.  Getting fired or changing jobs.  Moving into a new home. Other causes may be:  Sexual problems.  Business or financial losses.  Taking on a large debt.  Regular conflict with someone at home or at work.  Constant tiredness from lack of sleep. It is not just bad things that are stressful. It may be stressful to:  Win the lottery.  Get married.  Buy a new car. The amount of stress that can be easily tolerated varies from [place] to [place]. Changes generally cause stress, regardless of the types of change. Too much stress can affect your health. It may lead to physical or emotional problems. Too little stress (boredom) may also become stressful. SUGGESTIONS TO  REDUCE STRESS:  Talk things over with your family and friends. It often is helpful to share your concerns and worries. If you feel your problem is serious, you may want to get help from a professional counselor.  Consider your problems one at a time instead of lumping them all together. Trying to take care of everything at once may seem impossible. List all the things you need to do and then start with the most important one. Set a goal to accomplish 2 or 3 things each day. If you expect to do too many in a single day you will naturally fail, causing you to feel even more stressed.  Do not use alcohol or drugs to relieve stress. Although you may feel better for a short time, they do not remove the problems that caused the stress. They can also be habit forming.  Exercise regularly - at least 3 times per week. Physical exercise can help to relieve that "uptight" feeling and will relax you.  The shortest distance between despair and hope is often a good night's sleep.  Go to bed and get up on time allowing yourself time for appointments without being rushed.  Take a short "time-out" period from any stressful situation that occurs during the day. Close your eyes and take some deep breaths. Starting with the muscles in your face, tense them, hold it for a few seconds, then relax. Repeat this with the muscles in your neck, shoulders, hand, stomach, back and legs.  Take good care of yourself. Eat a balanced diet and get plenty of rest.  Schedule time for having fun. Take a break from your daily routine to relax. HOME CARE INSTRUCTIONS   Call if you feel overwhelmed by your problems and feel you can no longer manage them on your own.  Return immediately if you feel like hurting yourself or someone else. Document Released: 04/10/2001 Document Revised: 01/07/2012 Document Reviewed: 12/01/2007 ExitCare Patient Information 2013 [employer].   

## 2013-07-06 LAB — IPS PAP TEST WITH HPV

## 2013-07-08 NOTE — Progress Notes (Signed)
Note reviewed, agree with plan.  Estefany Goebel, MD  

## 2013-09-03 ENCOUNTER — Other Ambulatory Visit: Payer: Self-pay

## 2014-05-04 ENCOUNTER — Encounter: Payer: BC Managed Care – PPO | Attending: Family Medicine | Admitting: Dietician

## 2014-05-04 ENCOUNTER — Encounter: Payer: Self-pay | Admitting: Dietician

## 2014-05-04 VITALS — Ht 66.5 in | Wt 225.0 lb

## 2014-05-04 DIAGNOSIS — Z713 Dietary counseling and surveillance: Secondary | ICD-10-CM | POA: Diagnosis not present

## 2014-05-04 DIAGNOSIS — E669 Obesity, unspecified: Secondary | ICD-10-CM | POA: Diagnosis present

## 2014-05-04 NOTE — Patient Instructions (Addendum)
-  Motivation: getting stronger and more fit, having more energy, being healthy  -Work on eating slowly -Eat at the table with no distractions  -Work on portion control -Try eating off of smaller plates and bowls -Measure out your food ahead of time -At restaurants, pack up half of meal -Split a side  -Enjoy treats that are worth it to you  -Try having less half and half or 2% milk in coffee -Have non-fat milk in lattes   -Fill up on nonstarchy vegetables  -Increase physical activity  -Walking   -Use Nike Fuel band  -Include a protein food with every meal  -Good carbs = high fiber, low sugar -Limit fried foods on the weekends -Try whole wheat noodles and brown rice  -Refer to techniques to recognize hunger and fullness cues

## 2014-05-04 NOTE — Progress Notes (Signed)
  Medical Nutrition Therapy:  Appt start time: 0845 end time:  0945.   Assessment:  Primary concerns today: April Campbell is here today with her husband. She reports that she has struggled with nutrition since college and never really had a balanced diet. Has tried several diets and suspects she has "screwed up her metabolism." Started working out with a Systems analystpersonal trainer 2x a week in April. April Campbell also recently became a vegetarian. She describes herself as a stress eater and admits to eating quickly. Stands up while eating breakfast and they eat dinner in the living room with the TV on. They do the grocery shopping together and her husband cooks. They eat out about 3x a week: usually sandwiches with fries or potato chips. April Campbell works from 8:30-5pm Monday through Friday. Has seen a therapist regarding her tendency to use food for comfort.  Preferred Learning Style:   No preference indicated   Learning Readiness:   Ready   MEDICATIONS: see list   DIETARY INTAKE:  Usual eating pattern includes 3 meals and 0 snacks per day.  Avoided foods include meat, nuts, peaches, kale, seafood, corn.    24-hr recall:  B ( AM): 2 eggs and yogurt; 1/2 banana bread bagel and yogurt; cereal and banana  Snk ( AM):   L ( PM): salad or sandwich; spinach salad with dried fruit, vegetables, goat cheese, pomegranate dressing, crackers and 2 oreos OR apple and cheese Snk ( PM):  D ( PM): pasta with butter or marinara; beans and rice burrito; homemade pizza Snk (PM):   Beverages: sparkling water, coffee with Splenda and half and half, [facility] Francis[facility] Dew on Saturdays, diet coke  Usual physical activity: walking occasionally; personal trainer 2x a week  Estimated energy needs: 1800 calories   Progress Towards Goal(s):  In progress.   Nutritional Diagnosis:  [place]-3.3 Overweight/obesity As related to exessive energy intake.  As evidenced by BMI 36.    Intervention:  Nutrition counseling provided. -Motivation: getting  stronger and more fit, having more energy, being healthy  -Work on eating slowly -Eat at the table with no distractions  -Work on portion control -Try eating off of smaller plates and bowls -Measure out your food ahead of time -At restaurants, pack up half of meal -Split a side  -Enjoy treats that are worth it to you  -Try having less half and half or 2% milk in coffee -Have non-fat milk in lattes   -Fill up on nonstarchy vegetables  -Increase physical activity  -Walking   -Use Nike Fuel band  -Include a protein food with every meal  -Good carbs = high fiber, low sugar -Limit fried foods on the weekends -Try whole wheat noodles and brown rice  -Refer to techniques to recognize hunger and fullness cues  Teaching Method Utilized:  Visual Auditory Hands on  Handouts given during visit include:  MyPlate  Vegetarian proteins  15g CHO + protein snacks  Barriers to learning/adherence to lifestyle change: none  Demonstrated degree of understanding via:  Teach Back   Monitoring/Evaluation:  Dietary intake, exercise, and body weight in 2 month(s).

## 2014-07-02 ENCOUNTER — Encounter: Payer: Self-pay | Admitting: Certified Nurse Midwife

## 2014-07-02 ENCOUNTER — Ambulatory Visit (INDEPENDENT_AMBULATORY_CARE_PROVIDER_SITE_OTHER): Payer: BC Managed Care – PPO | Admitting: Certified Nurse Midwife

## 2014-07-02 VITALS — BP 104/62 | HR 68 | Resp 16 | Ht 65.25 in | Wt 224.0 lb

## 2014-07-02 DIAGNOSIS — Z01419 Encounter for gynecological examination (general) (routine) without abnormal findings: Secondary | ICD-10-CM

## 2014-07-02 DIAGNOSIS — Z0000 Encounter for general adult medical examination without abnormal findings: Secondary | ICD-10-CM

## 2014-07-02 DIAGNOSIS — Z124 Encounter for screening for malignant neoplasm of cervix: Secondary | ICD-10-CM

## 2014-07-02 LAB — POCT URINALYSIS DIPSTICK
Bilirubin, UA: NEGATIVE
Blood, UA: NEGATIVE
Glucose, UA: NEGATIVE
KETONES UA: NEGATIVE
Leukocytes, UA: NEGATIVE
Nitrite, UA: NEGATIVE
PH UA: 5
PROTEIN UA: NEGATIVE
UROBILINOGEN UA: NEGATIVE

## 2014-07-02 NOTE — Patient Instructions (Signed)

## 2014-07-02 NOTE — Progress Notes (Signed)
38 y.o. G0P0000 Married Caucasian Fe here for annual exam.  Periods normal, no issues. Patient stopped Seasonale use about 6 months, because she felt that she did not need for cycle control now. Sees PCP for labs and medication management.. Migraines have decreased in intensity. Patient working on [employer] now with exercise and diet. No health issues today.  Patient's last menstrual period was 06/22/2014.          Sexually active: Yes.    The current method of family planning is vasectomy.    Exercising: Yes.    strength training/cardio Smoker:  no  Health Maintenance: Pap:  07-01-13 neg HPV HR neg MMG:  none Colonoscopy:  4-15- per pt normal BMD:   none TDaP:  03-18-14 Labs: Poct urine-neg Self breast exam: done occ   reports that she has never smoked. She has never used smokeless tobacco. She reports that she drinks about one ounce of alcohol per week. She reports that she does not use illicit drugs.  Past Medical History  Diagnosis Date  . GERD (gastroesophageal reflux disease)      over counter medication  . Depression   . Seizure     last 11/19/2011  . Constipation     with rectal bleeding  . Migraines     no aura  . Anxiety   . Endometriosis     Past Surgical History  Procedure Laterality Date  . Elbow surgery  2012  . Hernia repair      baby  . Laparoscopic endometriosis fulguration  2007, 2011  . Colonoscopy with propofol N/A 02/10/2013    Procedure: COLONOSCOPY WITH PROPOFOL;  Surgeon: Charolett Bumpers, MD;  Location: [facility];  Service: Endoscopy;  Laterality: N/A;    Current Outpatient Prescriptions  Medication Sig Dispense Refill  . amitriptyline (ELAVIL) 50 MG tablet Take 150 mg by mouth at bedtime.       . Diclofenac Potassium (CAMBIA PO) Take by mouth as needed.      Marland Kitchen ibuprofen (ADVIL,MOTRIN) 200 MG tablet Take 200 mg by mouth every 6 (six) hours as needed for pain.      Marland Kitchen lamoTRIgine (LAMICTAL) 100 MG tablet Take 100 mg by mouth daily.      Marland Kitchen  levETIRAcetam (KEPPRA) 1000 MG tablet Take 1,000 mg by mouth 2 (two) times daily.      Marland Kitchen loratadine (CLARITIN) 10 MG tablet Take 10 mg by mouth daily.      . Multiple Vitamins-Minerals (MULTIVITAMIN PO) Take by mouth daily.      . ranitidine (ZANTAC) 150 MG tablet Take 150 mg by mouth as needed.        No current facility-administered medications for this visit.    Family History  Problem Relation Age of Onset  . Hypertension Mother   . Diabetes Mother   . Hypertension Maternal Grandmother   . Diabetes Maternal Grandmother     ROS:  Pertinent items are noted in HPI.  Otherwise, a comprehensive ROS was negative.  Exam:   BP 104/62  Pulse 68  Resp 16  Ht 5' 5.25" (1.657 m)  Wt 224 lb (101.606 kg)  BMI 37.01 kg/m2  LMP 06/22/2014 Height: 5' 5.25" (165.7 cm)  Ht Readings from Last 3 Encounters:  07/02/14 5' 5.25" (1.657 m)  05/04/14 5' 6.5" (1.689 m)  07/01/13 5' 5.5" (1.664 m)    General appearance: alert, cooperative and appears stated age Head: Normocephalic, without obvious abnormality, atraumatic Neck: no adenopathy, supple, symmetrical, trachea midline and  thyroid normal to inspection and palpation Lungs: clear to auscultation bilaterally Breasts: normal appearance, no masses or tenderness, No nipple retraction or dimpling, No nipple discharge or bleeding, No axillary or supraclavicular adenopathy Heart: regular rate and rhythm Abdomen: soft, non-tender; no masses,  no organomegaly Extremities: extremities normal, atraumatic, no cyanosis or edema Skin: Skin color, texture, turgor normal. No rashes or lesions Lymph nodes: Cervical, supraclavicular, and axillary nodes normal. No abnormal inguinal nodes palpated Neurologic: Grossly normal   Pelvic: External genitalia:  no lesions              Urethra:  normal appearing urethra with no masses, tenderness or lesions              Bartholin's and Skene's: normal                 Vagina: normal appearing vagina with normal  color and discharge, no lesions              Cervix: normal, non tender, no lesions              Pap taken: Yes.   patient request Bimanual Exam:  Uterus:  normal size, contour, position, consistency, mobility, non-tender and anteverted              Adnexa: normal adnexa and no mass, fullness, tenderness               Rectovaginal: Confirms               Anus:  normal sphincter tone, no lesions  A:  Well Woman with normal exam  Contraception spouse vasectomy  Anxiety stable medication with PCP management    P:   Reviewed health and wellness pertinent to exam  Continue follow up as  indicated  Pap smear taken today with reflex   counseled on breast self exam, adequate intake of calcium and vitamin D, diet and exercise  return annually or prn  An After Visit Summary was printed and given to the patient.

## 2014-07-03 NOTE — Progress Notes (Signed)
Reviewed personally.  M. Suzanne Kinslei Labine, MD.  

## 2014-07-07 LAB — IPS PAP TEST WITH REFLEX TO HPV

## 2014-07-08 ENCOUNTER — Ambulatory Visit: Payer: BC Managed Care – PPO | Admitting: Dietician

## 2015-01-03 ENCOUNTER — Ambulatory Visit (INDEPENDENT_AMBULATORY_CARE_PROVIDER_SITE_OTHER): Payer: BLUE CROSS/BLUE SHIELD | Admitting: Certified Nurse Midwife

## 2015-01-03 ENCOUNTER — Telehealth: Payer: Self-pay | Admitting: Certified Nurse Midwife

## 2015-01-03 ENCOUNTER — Encounter: Payer: Self-pay | Admitting: Certified Nurse Midwife

## 2015-01-03 VITALS — BP 120/80 | HR 70 | Resp 16 | Ht 65.25 in | Wt 210.0 lb

## 2015-01-03 DIAGNOSIS — S3140XA Unspecified open wound of vagina and vulva, initial encounter: Secondary | ICD-10-CM

## 2015-01-03 DIAGNOSIS — S3141XA Laceration without foreign body of vagina and vulva, initial encounter: Secondary | ICD-10-CM

## 2015-01-03 NOTE — Telephone Encounter (Signed)
Spoke with patient. Patient states that she had intercourse on Friday 3/4. "It hurt during and now I am still hurting. I feel irritated and it stings when I use the bathroom. I have a constant sharp pain like something tore." Patient having increased urinary frequency. Denies fevers, chills, or lower back pain. Advised will need to be seen for evaluation with Verner Choleborah S. Leonard CNM. Patient is agreeable. Appointment scheduled for today at 2:30pm. Agreeable to date and time.  Routing to provider for final review. Patient agreeable to disposition. Will close encounter

## 2015-01-03 NOTE — Progress Notes (Signed)
39 y.o. married white female g0p0 here with complaint of vaginal symptoms of pain after intercourse which occurred two days ago. Patient describes it as tearing feeling and notes in more on entrance to vagina. Denies bleeding or new personal products or any devices. Patient did have prolonged oral stimulation with some sucking on tissue and finger stimulation. She did not note pain until sexual activity ended. No lubricant used. No STD concerns or testing. Denies vaginal itching or increase discharge. No other health issues.  O:Healthy female WDWN Affect: normal, orientation x 3  Exam: Abdomen: non tender Lymph node: no enlargement or tenderness Pelvic exam: External genital:  BUS: negative Vagina: small superficial laceration noted on right inner vaginal wall at introitus, on left inner vaginal wall at introitus, appears to be traumatized area with slight sloughing of tissue noted. Both areas tender, no bleeding or pus from area. Inspected vaginal canal no other concerns noted. Healthy vaginal tissue with scant discharge noted  Cervix: normal, non tender Uterus: normal, non tender Adnexa:normal, non tender, no masses or fullness noted   A: Vaginal laceration and traumatized tissue noted from sexual activity. Normal pelvic exam   P:Discussed findings with patient and etiology. Discussed Aveeno  sitz bath for comfort. Apply coconut oil to area for healing and protection of rubbing together. Discussed expectations of healing and to call if pain increases rather than decreases. Discussed coconut oil for vigorous stimulation. Discussed avoiding any stimulation to area or sexual activity until completely healed. Make sure spouse nails are short to prevent other injury or avoid all together. Questions addressed.   Rv prn

## 2015-01-03 NOTE — Telephone Encounter (Signed)
Patient is having vaginal pain. Last seen 94/15.

## 2015-01-04 NOTE — Progress Notes (Signed)
Reviewed personally.  M. Suzanne Anielle Headrick, MD.  

## 2015-06-09 ENCOUNTER — Other Ambulatory Visit: Payer: Self-pay | Admitting: Orthopedic Surgery

## 2015-06-10 ENCOUNTER — Encounter (HOSPITAL_BASED_OUTPATIENT_CLINIC_OR_DEPARTMENT_OTHER): Payer: Self-pay | Admitting: *Deleted

## 2015-06-14 ENCOUNTER — Ambulatory Visit (HOSPITAL_BASED_OUTPATIENT_CLINIC_OR_DEPARTMENT_OTHER): Payer: BLUE CROSS/BLUE SHIELD | Admitting: Anesthesiology

## 2015-06-14 ENCOUNTER — Encounter (HOSPITAL_BASED_OUTPATIENT_CLINIC_OR_DEPARTMENT_OTHER): Payer: Self-pay | Admitting: *Deleted

## 2015-06-14 ENCOUNTER — Ambulatory Visit (HOSPITAL_BASED_OUTPATIENT_CLINIC_OR_DEPARTMENT_OTHER)
Admission: RE | Admit: 2015-06-14 | Discharge: 2015-06-14 | Disposition: A | Discharge status: Home / Self Care | Payer: BLUE CROSS/BLUE SHIELD | Source: Ambulatory Visit | Attending: Orthopedic Surgery | Admitting: Orthopedic Surgery

## 2015-06-14 ENCOUNTER — Encounter (HOSPITAL_BASED_OUTPATIENT_CLINIC_OR_DEPARTMENT_OTHER)
Admission: RE | Disposition: A | Discharge status: Home / Self Care | Payer: Self-pay | Source: Ambulatory Visit | Attending: Orthopedic Surgery

## 2015-06-14 DIAGNOSIS — K219 Gastro-esophageal reflux disease without esophagitis: Secondary | ICD-10-CM | POA: Insufficient documentation

## 2015-06-14 DIAGNOSIS — G5601 Carpal tunnel syndrome, right upper limb: Secondary | ICD-10-CM | POA: Insufficient documentation

## 2015-06-14 HISTORY — PX: CARPAL TUNNEL RELEASE: SHX101

## 2015-06-14 HISTORY — DX: Personal history of other diseases of the nervous system and sense organs: Z86.69

## 2015-06-14 HISTORY — DX: Epilepsy, unspecified, not intractable, without status epilepticus: G40.909

## 2015-06-14 LAB — POCT HEMOGLOBIN-HEMACUE: Hemoglobin: 13 g/dL (ref 12.0–15.0)

## 2015-06-14 SURGERY — RELEASE, CARPAL TUNNEL, ENDOSCOPIC
Anesthesia: Monitor Anesthesia Care | Site: Wrist | Laterality: Right

## 2015-06-14 MED ORDER — LACTATED RINGERS IV SOLN
INTRAVENOUS | Status: DC
  Administered 2015-06-14: 15:00:00 via INTRAVENOUS

## 2015-06-14 MED ORDER — FENTANYL CITRATE (PF) 100 MCG/2ML IJ SOLN
50.0000 ug | INTRAMUSCULAR | Status: DC | PRN
  Administered 2015-06-14: 50 ug via INTRAVENOUS

## 2015-06-14 MED ORDER — ONDANSETRON HCL 4 MG/2ML IJ SOLN
INTRAMUSCULAR | Status: DC | PRN
  Administered 2015-06-14: 4 mg via INTRAVENOUS

## 2015-06-14 MED ORDER — MIDAZOLAM HCL 2 MG/2ML IJ SOLN
1.0000 mg | INTRAMUSCULAR | Status: DC | PRN
  Administered 2015-06-14: 2 mg via INTRAVENOUS

## 2015-06-14 MED ORDER — SCOPOLAMINE 1 MG/3DAYS TD PT72: 1.0000 | MEDICATED_PATCH | Freq: Once | TRANSDERMAL | Status: DC | PRN

## 2015-06-14 MED ORDER — PROPOFOL INFUSION 10 MG/ML OPTIME
INTRAVENOUS | Status: DC | PRN
  Administered 2015-06-14: 100 ug/kg/min via INTRAVENOUS

## 2015-06-14 MED ORDER — GLYCOPYRROLATE 0.2 MG/ML IJ SOLN: 0.2000 mg | Freq: Once | INTRAMUSCULAR | Status: DC | PRN

## 2015-06-14 MED ORDER — LACTATED RINGERS IV SOLN: INTRAVENOUS | Status: DC

## 2015-06-14 MED ORDER — CEFAZOLIN SODIUM-DEXTROSE 2-3 GM-% IV SOLR
2.0000 g | INTRAVENOUS | Status: AC
  Administered 2015-06-14: 2 g via INTRAVENOUS

## 2015-06-14 MED ORDER — BUPIVACAINE-EPINEPHRINE (PF) 0.5% -1:200000 IJ SOLN
INTRAMUSCULAR | Status: DC | PRN
  Administered 2015-06-14: 5 mL via PERINEURAL

## 2015-06-14 MED ORDER — LIDOCAINE HCL 2 % IJ SOLN
INTRAMUSCULAR | Status: AC
  Filled 2015-06-14: qty 20

## 2015-06-14 MED ORDER — LIDOCAINE HCL (CARDIAC) 20 MG/ML IV SOLN
INTRAVENOUS | Status: DC | PRN
  Administered 2015-06-14: 30 mg via INTRAVENOUS

## 2015-06-14 MED ORDER — FENTANYL CITRATE (PF) 100 MCG/2ML IJ SOLN
INTRAMUSCULAR | Status: AC
  Filled 2015-06-14: qty 2

## 2015-06-14 MED ORDER — LIDOCAINE HCL 2 % IJ SOLN
INTRAMUSCULAR | Status: DC | PRN
  Administered 2015-06-14: 5 mL

## 2015-06-14 MED ORDER — MIDAZOLAM HCL 2 MG/2ML IJ SOLN
INTRAMUSCULAR | Status: AC
  Filled 2015-06-14: qty 2

## 2015-06-14 MED ORDER — BUPIVACAINE-EPINEPHRINE (PF) 0.5% -1:200000 IJ SOLN
INTRAMUSCULAR | Status: AC
  Filled 2015-06-14: qty 30

## 2015-06-14 SURGICAL SUPPLY — 42 items
APPLICATOR COTTON TIP 6IN STRL (MISCELLANEOUS) ×2 IMPLANT
BLADE HOOK ENDO STRL (BLADE) ×2 IMPLANT
BLADE SURG 15 STRL LF DISP TIS (BLADE) ×1 IMPLANT
BLADE SURG 15 STRL SS (BLADE) ×2
BLADE TRIANGLE EPF/EGR ENDO (BLADE) ×2 IMPLANT
BNDG CMPR 9X4 STRL LF SNTH (GAUZE/BANDAGES/DRESSINGS) ×1
BNDG COHESIVE 4X5 TAN STRL (GAUZE/BANDAGES/DRESSINGS) ×2 IMPLANT
BNDG ESMARK 4X9 LF (GAUZE/BANDAGES/DRESSINGS) ×2 IMPLANT
BNDG GAUZE ELAST 4 BULKY (GAUZE/BANDAGES/DRESSINGS) ×2 IMPLANT
CHLORAPREP W/TINT 26ML (MISCELLANEOUS) ×2 IMPLANT
CORDS BIPOLAR (ELECTRODE) IMPLANT
COVER BACK TABLE 60X90IN (DRAPES) ×2 IMPLANT
COVER MAYO STAND STRL (DRAPES) ×2 IMPLANT
CUFF TOURNIQUET SINGLE 18IN (TOURNIQUET CUFF) ×1 IMPLANT
DRAIN TLS ROUND 10FR (DRAIN) ×2 IMPLANT
DRAPE EXTREMITY T 121X128X90 (DRAPE) ×2 IMPLANT
DRAPE SURG 17X23 STRL (DRAPES) ×2 IMPLANT
DRSG EMULSION OIL 3X3 NADH (GAUZE/BANDAGES/DRESSINGS) ×2 IMPLANT
GLOVE BIO SURGEON STRL SZ7.5 (GLOVE) ×2 IMPLANT
GLOVE BIOGEL PI IND STRL 7.0 (GLOVE) ×1 IMPLANT
GLOVE BIOGEL PI IND STRL 8 (GLOVE) ×1 IMPLANT
GLOVE BIOGEL PI INDICATOR 7.0 (GLOVE) ×2
GLOVE BIOGEL PI INDICATOR 8 (GLOVE) ×1
GLOVE ECLIPSE 6.5 STRL STRAW (GLOVE) ×3 IMPLANT
GOWN STRL REUS W/ TWL LRG LVL3 (GOWN DISPOSABLE) ×2 IMPLANT
GOWN STRL REUS W/TWL LRG LVL3 (GOWN DISPOSABLE) ×4
GOWN STRL REUS W/TWL XL LVL3 (GOWN DISPOSABLE) ×2 IMPLANT
NDL HYPO 25X1 1.5 SAFETY (NEEDLE) IMPLANT
NEEDLE HYPO 25X1 1.5 SAFETY (NEEDLE) IMPLANT
NS IRRIG 1000ML POUR BTL (IV SOLUTION) ×2 IMPLANT
PACK BASIN DAY SURGERY FS (CUSTOM PROCEDURE TRAY) ×2 IMPLANT
PADDING CAST ABS 4INX4YD NS (CAST SUPPLIES)
PADDING CAST ABS COTTON 4X4 ST (CAST SUPPLIES) IMPLANT
SPONGE GAUZE 4X4 12PLY STER LF (GAUZE/BANDAGES/DRESSINGS) ×2 IMPLANT
STOCKINETTE 6  STRL (DRAPES) ×1
STOCKINETTE 6 STRL (DRAPES) ×1 IMPLANT
SUT VICRYL RAPIDE 4-0 (SUTURE) ×2 IMPLANT
SYR BULB 3OZ (MISCELLANEOUS) ×2 IMPLANT
SYRINGE 10CC LL (SYRINGE) IMPLANT
TOWEL OR 17X24 6PK STRL BLUE (TOWEL DISPOSABLE) ×4 IMPLANT
TOWEL OR NON WOVEN STRL DISP B (DISPOSABLE) ×2 IMPLANT
UNDERPAD 30X30 (UNDERPADS AND DIAPERS) ×2 IMPLANT

## 2015-06-14 NOTE — Transfer of Care (Signed)
Immediate Anesthesia Transfer of Care Note  Patient: April Campbell  Procedure(s) Performed: Procedure(s): RIGHT CARPAL TUNNEL RELEASE ENDOSCOPIC (Right)  Patient Location: PACU  Anesthesia Type:MAC  Level of Consciousness: awake, alert , oriented and patient cooperative  Airway & Oxygen Therapy: Patient Spontanous Breathing and Patient connected to face mask oxygen  Post-op Assessment: Report given to RN and Post -op Vital signs reviewed and stable  Post vital signs: Reviewed and stable  Last Vitals:  Filed Vitals:   06/14/15 1405  BP: 128/84  Pulse: 61  Temp: 36.4 C  Resp: 16    Complications: No apparent anesthesia complications

## 2015-06-14 NOTE — Anesthesia Preprocedure Evaluation (Signed)
Anesthesia Evaluation  Patient identified by MRN, date of birth, ID band Patient awake    Reviewed: Allergy & Precautions, H&P , NPO status , Patient's Chart, lab work & pertinent test results  Airway Mallampati: I  TM Distance: >3 FB Neck ROM: Full    Dental  (+) Dental Advisory Given, Teeth Intact   Pulmonary neg pulmonary ROS,  breath sounds clear to auscultation  Pulmonary exam normal       Cardiovascular negative cardio ROS Normal cardiovascular examRhythm:Regular Rate:Normal     Neuro/Psych Seizures -, Well Controlled,  PSYCHIATRIC DISORDERS Depression    GI/Hepatic Neg liver ROS, GERD-  Controlled and Medicated,  Endo/Other  negative endocrine ROS  Renal/GU negative Renal ROS     Musculoskeletal negative musculoskeletal ROS (+)   Abdominal (+) + obese,   Peds  Hematology negative hematology ROS (+)   Anesthesia Other Findings   Reproductive/Obstetrics negative OB ROS                             Anesthesia Physical  Anesthesia Plan  ASA: II  Anesthesia Plan: MAC   Post-op Pain Management:    Induction: Intravenous  Airway Management Planned:   Additional Equipment:   Intra-op Plan:   Post-operative Plan:   Informed Consent: I have reviewed the patients History and Physical, chart, labs and discussed the procedure including the risks, benefits and alternatives for the proposed anesthesia with the patient or authorized representative who has indicated his/her understanding and acceptance.   Dental advisory given  Plan Discussed with: CRNA  Anesthesia Plan Comments:         Anesthesia Quick Evaluation

## 2015-06-14 NOTE — Anesthesia Procedure Notes (Signed)
Procedure Name: MAC Date/Time: 06/14/2015 3:30 PM Performed by: Coda Filler D Pre-anesthesia Checklist: Patient identified, Emergency Drugs available, Suction available, Patient being monitored and Timeout performed Patient Re-evaluated:Patient Re-evaluated prior to inductionOxygen Delivery Method: Simple face mask

## 2015-06-14 NOTE — Interval H&P Note (Signed)
History and Physical Interval Note:  06/14/2015 2:35 PM  April Campbell  has presented today for surgery, with the diagnosis of right carpal tunnel syndrome  The various methods of treatment have been discussed with the patient and family. After consideration of risks, benefits and other options for treatment, the patient has consented to  Procedure(s): RIGHT CARPAL TUNNEL RELEASE ENDOSCOPIC (Right) as a surgical intervention .  The patient's history has been reviewed, patient examined, no change in status, stable for surgery.  I have reviewed the patient's chart and labs.  Questions were answered to the patient's satisfaction.     [name]

## 2015-06-14 NOTE — Discharge Instructions (Signed)
Discharge Instructions ° ° °You have a light dressing on your hand.  °You may begin gentle motion of your fingers and hand immediately, but you should not do any heavy lifting or gripping.  Elevate your hand to reduce pain & swelling of the digits.  Ice over the operative site may be helpful to reduce pain & swelling.  DO NOT USE HEAT. °Pain medicine has been prescribed for you.  °Use your medicine as needed over the first 48 hours, and then you can begin to taper your use. You may use Tylenol in place of your prescribed pain medication, but not IN ADDITION to it. °Leave the dressing in place until the third day after your surgery and then remove it, leaving it open to air.  °After the bandage has been removed you may shower, but do not soak the incision.  °You may drive a car when you are off of prescription pain medications and can safely control your vehicle with both hands. °We will address whether therapy will be required or not when you return to the office. °You may have already made your follow-up appointment when we completed your preop visit.  If not, please call our office today or the next business day to make your return appointment for 10-15 days after surgery. ° ° °Please call [phone] during normal business hours or [phone] after hours for any problems. Including the following: ° °- excessive redness of the incisions °- drainage for more than 4 days °- fever of more than 101.5 F ° °*Please note that pain medications will not be refilled after hours or on weekends. ° ° °TLS Drain Instructions °You have a drain tube in place to help limit the amount of blood that collects under your skin in the early post-operative period.  The amount it drains will vary from [place] and is dependent upon many factors.  You will have 1-2 extra drain tubes sent with you from the facility.  You should change the tube according to these instructions below when the tube is about half full. ° °BE SURE TO  SLIDE THE CLAMP TO THE CLOSED POSITION BEFORE CHANGING THE TUBE.   ° °RE-OPEN THE CLAMP ONCE THE GLASS EVACUATION TUBE HAS BEEN CHANGED. ° °24 hours after surgery the amount of drainage will likely be negligible and it will be time to remove the tube and discard it.  Simply remove the tape that secures the flexible plastic drainage tube to the bandage and briskly pull the tube straight out.  You will likely feel a little discomfort during this process, but the tube should slide out as it is not sewn or otherwise secured directly to your body.  It is merely held in place by the bandage itself.  °                           ° °If you are not clear about when your first post-op appointment is scheduled, please call the office on the next business day to inquire about it.  Please also call the office if you have any other questions ([phone]).   ° ° ° °Post Anesthesia Home Care Instructions ° °Activity: °Get plenty of rest for the remainder of the day. A responsible adult should stay with you for 24 hours following the procedure.  °For the next 24 hours, DO NOT: °-Drive a car °-Operate machinery °-Drink alcoholic beverages °-Take any medication unless instructed by your physician °-Make any legal   decisions or sign important papers. ° °Meals: °Start with liquid foods such as gelatin or soup. Progress to regular foods as tolerated. Avoid greasy, spicy, heavy foods. If nausea and/or vomiting occur, drink only clear liquids until the nausea and/or vomiting subsides. Call your physician if vomiting continues. ° °Special Instructions/Symptoms: °Your throat may feel dry or sore from the anesthesia or the breathing tube placed in your throat during surgery. If this causes discomfort, gargle with warm salt water. The discomfort should disappear within 24 hours. ° °If you had a scopolamine patch placed behind your ear for the management of post- operative nausea and/or vomiting: ° °1. The medication in the patch is effective  for 72 hours, after which it should be removed.  Wrap patch in a tissue and discard in the trash. Wash hands thoroughly with soap and water. °2. You may remove the patch earlier than 72 hours if you experience unpleasant side effects which may include dry mouth, dizziness or visual disturbances. °3. Avoid touching the patch. Wash your hands with soap and water after contact with the patch. ° ° °Call your surgeon if you experience:  ° °1.  Fever over 101.0. °2.  Inability to urinate. °3.  Nausea and/or vomiting. °4.  Extreme swelling or bruising at the surgical site. °5.  Continued bleeding from the incision. °6.  Increased pain, redness or drainage from the incision. °7.  Problems related to your pain medication. °8. Any change in color, movement and/or sensation °9. Any problems and/or concerns  ° ° °TLS Drain Instructions °You have a drain tube in place to help limit the amount of blood that collects under your skin in the early post-operative period.  The amount it drains will vary from [place] and is dependent upon many factors.  You will have 1-2 extra drain tubes sent with you from the facility.  You should change the tube according to these instructions below when the tube is about half full. ° °BE SURE TO SLIDE THE CLAMP TO THE CLOSED POSITION BEFORE CHANGING THE TUBE.   ° °RE-OPEN THE CLAMP ONCE THE GLASS EVACUATION TUBE HAS BEEN CHANGED. ° °24 hours after surgery the amount of drainage will likely be negligible and it will be time to remove the tube and discard it.  Simply remove the tape that secures the flexible plastic drainage tube to the bandage and briskly pull the tube straight out.  You will likely feel a little discomfort during this process, but the tube should slide out as it is not sewn or otherwise secured directly to your body.  It is merely held in place by the bandage itself.  °                           ° °If you are not clear about when your first post-op appointment is  scheduled, please call the office on the next business day to inquire about it.  Please also call the office if you have any other questions ([phone]).   °  °

## 2015-06-14 NOTE — Anesthesia Postprocedure Evaluation (Signed)
Anesthesia Post Note  Patient: April Campbell  Procedure(s) Performed: Procedure(s) (LRB): RIGHT CARPAL TUNNEL RELEASE ENDOSCOPIC (Right)  Anesthesia type: MAC  Patient location: PACU  Post pain: Pain level controlled  Post assessment: Post-op Vital signs reviewed  Last Vitals: BP 121/67 mmHg  Pulse 73  Temp(Src) 36.9 C (Oral)  Resp 16  Ht  (1.651 m)  Wt 190 lb (86.183 kg)  BMI 31.62 kg/m2  SpO2 100%  LMP 06/11/2015  Post vital signs: Reviewed  Level of consciousness: awake  Complications: No apparent anesthesia complications

## 2015-06-14 NOTE — Op Note (Signed)
06/14/2015  2:36 PM  PATIENT:  April Campbell  39 y.o. female  PRE-OPERATIVE DIAGNOSIS:  right carpal tunnel syndrome  POST-OPERATIVE DIAGNOSIS:  Same  PROCEDURE:  Procedure(s): RIGHT CARPAL TUNNEL RELEASE ENDOSCOPIC  SURGEON:  Surgeon(s): Mack Hook, MD  PHYSICIAN ASSISTANT: Danielle Rankin, OPA-C  ANESTHESIA:  Local / MAC  SPECIMENS:  None  DRAINS:   TLS x 1  EBL:  less than 50 mL  PREOPERATIVE INDICATIONS:  April Campbell is a  40 y.o. female with a diagnosis of right carpal tunnel syndrome who failed conservative measures and elected for surgical management.    The risks benefits and alternatives were discussed with the patient preoperatively including but not limited to the risks of infection, bleeding, nerve injury, cardiopulmonary complications, the need for revision surgery, among others, and the patient verbalized understanding and consented to proceed.  OPERATIVE IMPLANTS: None  OPERATIVE FINDINGS: Tight carpal tunnel, acceptably decompressed following release  OPERATIVE PROCEDURE:  After receiving prophylactic antibiotics, the patient was escorted to the operative theatre and placed in a supine position.   A surgical "time-out" was performed during which the planned procedure, proposed operative site, and the correct patient identity were compared to the operative consent and agreement confirmed by the circulating nurse according to current facility policy.  Following application of a tourniquet to the operative extremity, the planned incisions were marked and anesthetized with a mixture of lidocaine & bupivicaine containing epinephrine.  The exposed skin was prepped with Chloraprep and draped in the usual sterile fashion.  The limb was exsanguinated with an Esmarch bandage and the tourniquet inflated to approximately higher than systolic BP.   The incisions were made sharply. Subcutaneous tissues were dissected with blunt and spreading dissection. At the  proximal incision, the deep forearm fascia was split in line with the skin incision the distal edge grasped with a hemostat. At the mid palmar incision, the palmar fascia was split in line with the skin incision, revealing the underlying superficial palmar arch which was visualized with loupe assisted magnification. The synovial reflector was then introduced into the proximal incision and passed through the carpal canal, uses to reflect synovium from the deep surface of the transverse carpal ligament. It was removed and replaced with the slotted cannula and blunt obturator, passing from proximal to distal and exiting the distal wound superficial to the superficial palmar arch. The obturator was removed and the camera inserted. Visualization of the ligament was acceptable. The triangle shape blade was then inserted distally, advanced to the midportion of the ligament, and there used to create a perforation in the ligament. This instrument was removed and the hooked nstrument was inserted. It was placed into the perforation in the ligament and withdrawn distally, completing transection of the distal half of the ligament. The camera was then removed and placed into the distal end of the cannula. The hooked instrument was placed into the proximal end and advanced facility to be placed into the apex of the V. which had been formed to the distal hemi-transection of the ligament. It was withdrawn proximally, completing transection of the ligament. The adequacy of the release was judged with the scope and the instruments used as a probe. All of the endoscopic instruments are removed and the adequacy of release was again judged from the proximal incision perspective with direct loupe assisted visualization. In addition, the proximal forearm fascia was split for 2 inches proximal to the proximal incision under direct visualization using a sliding scissor technique. The  tourniquet was released, the wound copiously irrigated. A  TLS drain was placed to lie alongside the nerve exiting its own skin hole distally made with some sharp trocar. The skin was then closed with 4-0 Vicryl Rapide interrupted sutures. A light dressing was applied and the balance of 10 mL of the initial anesthetic mixture was placed down the drain, and the drain was clamped to be unclamped in the recovery room.  DISPOSITION:  The patient will be discharged home today, returning in 10-15 days for re-assessment.

## 2015-06-14 NOTE — H&P (Signed)
Kaytlynn Kochan Schroeck is an 39 y.o. female.   CC / Reason for Visit: Right hand problem HPI:.  This patient is a 39 year old RHD female who presents for evaluation of numbness and pain in the right hand.  It has been presents now for greater than 6 months.  She underwent NCS/EMG with Dr. Regino Schultze on 05-18-15, revealing severe right carpal tunnel syndrome.  She reports symptoms with certain activities, and certainly with prolonged use of the telephone or at nighttime.  Past Medical History  Diagnosis Date  . Epilepsy     last seizure - years ago  . History of migraine headaches   . GERD (gastroesophageal reflux disease)   . Carpal tunnel syndrome of right wrist 05/2015    Past Surgical History  Procedure Laterality Date  . Elbow surgery Left 2012  . Umbilical hernia repair      as an infant  . Colonoscopy with propofol N/A 02/10/2013    Procedure: COLONOSCOPY WITH PROPOFOL;  Surgeon: Charolett Bumpers, MD;  Location: [facility];  Service: Endoscopy;  Laterality: N/A;  . Diagnostic laparoscopy  12/15/2009    uterosacral nerve ablation  . Yag laser application  06/24/2006    vaporization of endometriosis    Family History  Problem Relation Age of Onset  . Hypertension Mother   . Diabetes Mother   . Hypertension Maternal Grandmother   . Diabetes Maternal Grandmother    Social History:  reports that she has never smoked. She has never used smokeless tobacco. She reports that she drinks about 1.0 oz of alcohol per week. She reports that she does not use illicit drugs.  Allergies:  Allergies  Allergen Reactions  . Wellbutrin [Bupropion Hcl] Other (See Comments)    SEIZURE    No prescriptions prior to admission    No results found for this or any previous visit (from the past 48 hour(s)). No results found.  Review of Systems  All other systems reviewed and are negative.   Height  (1.651 m), weight 83.915 kg (185 lb), last menstrual period 05/15/2015. Physical Exam  Constitutional:   WD, WN, NAD HEENT:  NCAT, EOMI Neuro/Psych:  Alert & oriented to [place], place, and time; appropriate mood & affect Lymphatic: No generalized UE edema or lymphadenopathy Extremities / MSK:  Both UE are normal with respect to appearance, ranges of motion, joint stability, muscle strength/tone, sensation, & perfusion except as otherwise noted:  Right hand is grossly normal without significant thenar atrophy.  She has a Tinel sign over the median nerve at the wrist crease, appropriate ring finger splitting, positive reverse Phalen's.  Labs / Xrays:  No radiographic studies obtained today.  Assessment: Right carpal tunnel syndrome  Plan:  I discussed this entity with her and the therapeutic options.  I recommended release via endoscopic 2 incision approach.  The details of the operative procedure were discussed with the patient.  Questions were invited and answered.  In addition to the goal of the procedure, the risks of the procedure to include but not limited to bleeding; infection; damage to the nerves or blood vessels that could result in bleeding, numbness, weakness, chronic pain, and the need for additional procedures; stiffness; the need for revision surgery; and anesthetic risks, were reviewed.  No specific outcome was guaranteed or implied.  Informed consent was obtained.  Artha Chiasson A. 06/14/2015, 8:02 AM

## 2015-06-15 ENCOUNTER — Encounter (HOSPITAL_BASED_OUTPATIENT_CLINIC_OR_DEPARTMENT_OTHER): Payer: Self-pay | Admitting: Orthopedic Surgery

## 2015-07-07 ENCOUNTER — Encounter: Payer: Self-pay | Admitting: Certified Nurse Midwife

## 2015-07-07 ENCOUNTER — Ambulatory Visit (INDEPENDENT_AMBULATORY_CARE_PROVIDER_SITE_OTHER): Payer: BLUE CROSS/BLUE SHIELD | Admitting: Certified Nurse Midwife

## 2015-07-07 VITALS — BP 116/78 | HR 68 | Resp 16 | Ht 65.5 in | Wt 186.0 lb

## 2015-07-07 DIAGNOSIS — N898 Other specified noninflammatory disorders of vagina: Secondary | ICD-10-CM

## 2015-07-07 DIAGNOSIS — Z0000 Encounter for general adult medical examination without abnormal findings: Secondary | ICD-10-CM | POA: Diagnosis not present

## 2015-07-07 DIAGNOSIS — Z01419 Encounter for gynecological examination (general) (routine) without abnormal findings: Secondary | ICD-10-CM

## 2015-07-07 LAB — POCT URINALYSIS DIPSTICK
BILIRUBIN UA: NEGATIVE
GLUCOSE UA: NEGATIVE
Ketones, UA: NEGATIVE
Leukocytes, UA: NEGATIVE
NITRITE UA: NEGATIVE
Protein, UA: NEGATIVE
RBC UA: NEGATIVE
Urobilinogen, UA: NEGATIVE
pH, UA: 5

## 2015-07-07 NOTE — Progress Notes (Signed)
39 y.o. G0P0000 Married  Caucasian Fe here for annual exam. Periods normal, no issues. Cycle has changed to 28-29 day cycle from 35 day cycle.Patient has noticed slight pink discharge with working out or running only. Denies vaginal itching or burning or odor, but has noted discharge slight more watery. No new personal products. Sees neurology for Epilepsy management and PCP aex/labs. No other health concerns today.  Patient's last menstrual period was 06/11/2015.          Sexually active: Yes.    The current method of family planning is vasectomy.    Exercising: Yes.    cardio & weights Smoker:  no  Health Maintenance: Pap: 07-02-14 neg MMG: none Colonoscopy:  2014 BMD:   none TDaP:  2015 Labs: Hgb-12.4, poct urine-neg Self breast exam: done occ   reports that she has never smoked. She has never used smokeless tobacco. She reports that she drinks about 1.2 oz of alcohol per week. She reports that she does not use illicit drugs.  Past Medical History  Diagnosis Date  . Epilepsy     last seizure - years ago  . History of migraine headaches   . GERD (gastroesophageal reflux disease)   . Carpal tunnel syndrome of right wrist 05/2015    Past Surgical History  Procedure Laterality Date  . Elbow surgery Left 2012  . Umbilical hernia repair      as an infant  . Colonoscopy with propofol N/A 02/10/2013    Procedure: COLONOSCOPY WITH PROPOFOL;  Surgeon: Charolett Bumpers, MD;  Location: [facility];  Service: Endoscopy;  Laterality: N/A;  . Diagnostic laparoscopy  12/15/2009    uterosacral nerve ablation  . Yag laser application  06/24/2006    vaporization of endometriosis  . Carpal tunnel release Right 06/14/2015    Procedure: RIGHT CARPAL TUNNEL RELEASE ENDOSCOPIC;  Surgeon: Mack Hook, MD;  Location: [facility];  Service: Orthopedics;  Laterality: Right;    Current Outpatient Prescriptions  Medication Sig Dispense Refill  . amitriptyline (ELAVIL) 50 MG tablet Take  50 mg by mouth at bedtime.     Marland Kitchen CAMBIA 50 MG PACK as needed.  1  . cholecalciferol (VITAMIN D) 1000 UNITS tablet Take 1,000 Units by mouth daily.    . clonazePAM (KLONOPIN) 0.5 MG tablet as needed.    Marland Kitchen FIBER PO Take by mouth daily.    Marland Kitchen ibuprofen (ADVIL,MOTRIN) 200 MG tablet Take 200 mg by mouth every 6 (six) hours as needed for pain.    Marland Kitchen lamoTRIgine (LAMICTAL) 100 MG tablet Take 100 mg by mouth 2 (two) times daily.     Marland Kitchen levETIRAcetam (KEPPRA) 1000 MG tablet TAKE ONE-HALF TABLET BY MOUTH EVERY MORNING & ONE TABLET BY MOUTH AT BEDTIME  0  . loratadine (CLARITIN) 10 MG tablet Take 10 mg by mouth as needed.     . Multiple Vitamins-Minerals (MULTIVITAMIN PO) Take by mouth daily.    . ranitidine (ZANTAC) 150 MG tablet Take 150 mg by mouth as needed.      No current facility-administered medications for this visit.    Family History  Problem Relation Age of Onset  . Hypertension Mother   . Diabetes Mother   . Hypertension Maternal Grandmother   . Diabetes Maternal Grandmother     ROS:  Pertinent items are noted in HPI.  Otherwise, a comprehensive ROS was negative.  Exam:   BP 116/78 mmHg  Pulse 68  Resp 16  Ht 5' 5.5" (1.664 m)  [employer]  186 lb (84.369 kg)  BMI 30.47 kg/m2  LMP 06/11/2015 Height: 5' 5.5" (166.4 cm) Ht Readings from Last 3 Encounters:  07/07/15 5' 5.5" (1.664 m)  06/14/15  (1.651 m)  01/03/15 5' 5.25" (1.657 m)    General appearance: alert, cooperative and appears stated age Head: Normocephalic, without obvious abnormality, atraumatic Neck: no adenopathy, supple, symmetrical, trachea midline and thyroid normal to inspection and palpation Lungs: clear to auscultation bilaterally Breasts: normal appearance, no masses or tenderness, No nipple retraction or dimpling, No nipple discharge or bleeding, No axillary or supraclavicular adenopathy Heart: regular rate and rhythm Abdomen: soft, non-tender; no masses,  no organomegaly Extremities: extremities normal,  atraumatic, no cyanosis or edema Skin: Skin color, texture, turgor normal. No rashes or lesions Lymph nodes: Cervical, supraclavicular, and axillary nodes normal. No abnormal inguinal nodes palpated Neurologic: Grossly normal   Pelvic: External genitalia:  no lesions              Urethra:  normal appearing urethra with no masses, tenderness or lesions              Bartholin's and Skene's: normal                 Vagina: normal appearing vagina with normal color and clear watery discharge,no odor, no lesions, affirm taken              Cervix: normal, non tender,no lesions              Pap taken: No. Bimanual Exam:  Uterus:  normal size, contour, position, consistency, mobility, non-tender              Adnexa: normal adnexa and no mass, fullness, tenderness               Rectovaginal: Confirms               Anus:  normal sphincter tone, no lesions  Chaperone present: yes   A:  Well Woman with normal exam  R/O vaginal infection  Weight loss( 44 pounds) over past year.  Epilepsy with neurology management  P:   Reviewed health and wellness pertinent to exam  Will treat per affirm results. Discussed perspiration with work out can sometimes vaginal discharge. Discussed importance if having spotting through out the month is not normal and need to advise.  Congratulated on weight loss and continue working on.  Follow up as indicated.  Pap smear as above not taken   counseled on breast self exam, adequate intake of calcium and vitamin D, diet and exercise  return annually or prn  An After Visit Summary was printed and given to the patient.

## 2015-07-07 NOTE — Patient Instructions (Signed)

## 2015-07-08 LAB — WET PREP BY MOLECULAR PROBE
CANDIDA SPECIES: NEGATIVE
Gardnerella vaginalis: NEGATIVE
TRICHOMONAS VAG: NEGATIVE

## 2015-07-08 NOTE — Progress Notes (Signed)
Reviewed personally.  M. Suzanne Bishop Vanderwerf, MD.  

## 2015-07-11 LAB — HEMOGLOBIN, FINGERSTICK: Hemoglobin, fingerstick: 12.4 g/dL (ref 12.0–16.0)

## 2015-12-06 DIAGNOSIS — G40309 Generalized idiopathic epilepsy and epileptic syndromes, not intractable, without status epilepticus: Secondary | ICD-10-CM | POA: Insufficient documentation

## 2015-12-06 DIAGNOSIS — G43009 Migraine without aura, not intractable, without status migrainosus: Secondary | ICD-10-CM | POA: Insufficient documentation

## 2015-12-13 ENCOUNTER — Ambulatory Visit: Payer: BLUE CROSS/BLUE SHIELD | Attending: Neurology | Admitting: Physical Therapy

## 2015-12-13 DIAGNOSIS — R293 Abnormal posture: Secondary | ICD-10-CM | POA: Insufficient documentation

## 2015-12-13 DIAGNOSIS — M542 Cervicalgia: Secondary | ICD-10-CM | POA: Diagnosis not present

## 2015-12-13 NOTE — Patient Instructions (Signed)
Levator Stretch   Grasp seat or sit on hand on side to be stretched. Turn head toward other side and look down. Use hand on head to gently stretch neck in that position. Hold __30__ seconds. Repeat on other side. Repeat _2-3___ times. Do __2__ sessions per day.  http://gt2.exer.us/30   Copyright  VHI. All rights reserved.  Side-Bending   One hand on opposite side of head, pull head to side as far as is comfortable. Stop if there is pain. Hold __30__ seconds. Repeat with other hand to other side. Repeat __2-3__ times. Do _2___ sessions per day.   Copyright  VHI. All rights reserved.  Scapular Retraction (Standing)   With arms at sides, pinch shoulder blades together. Repeat _10___ times per set. Do ___1_ sets per session. Do __OFTEN :)__ sessions per day.  http://orth.exer.us/944
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
## 2015-12-20 ENCOUNTER — Ambulatory Visit: Payer: BLUE CROSS/BLUE SHIELD | Admitting: Physical Therapy

## 2015-12-20 DIAGNOSIS — R293 Abnormal posture: Secondary | ICD-10-CM

## 2015-12-20 DIAGNOSIS — M542 Cervicalgia: Secondary | ICD-10-CM | POA: Diagnosis not present

## 2015-12-20 NOTE — Patient Instructions (Signed)
Trigger Point Dry Needling  . What is Trigger Point Dry Needling (DN)? o DN is a physical therapy technique used to treat muscle pain and dysfunction. Specifically, DN helps deactivate muscle trigger points (muscle knots).  o A thin filiform needle is used to penetrate the skin and stimulate the underlying trigger point. The goal is for a local twitch response (LTR) to occur and for the trigger point to relax. No medication of any kind is injected during the procedure.   . What Does Trigger Point Dry Needling Feel Like?  o The procedure feels different for each individual patient. Some patients report that they do not actually feel the needle enter the skin and overall the process is not painful. Very mild bleeding may occur. However, many patients feel a deep cramping in the muscle in which the needle was inserted. This is the local twitch response.   Marland Kitchen How Will I feel after the treatment? o Soreness is normal, and the onset of soreness may not occur for a few hours. Typically this soreness does not last longer than two days.  o Bruising is uncommon, however; ice can be used to decrease any possible bruising.  o In rare cases feeling tired or nauseous after the treatment is normal. In addition, your symptoms may get worse before they get better, this period will typically not last longer than 24 hours.   . What Can I do After My Treatment? o Increase your hydration by drinking more water for the next 24 hours. o You may place ice or heat on the areas treated that have become sore, however, do not use heat on inflamed or bruised areas. Heat often brings more relief post needling. o You can continue your regular activities, but vigorous activity is not recommended initially after the treatment for 24 hours. o DN is best combined with other physical therapy such as strengthening, stretching, and other therapies.   Garen Lah, PT 12/20/2015 9:07 AM Phone: [phone] Fax: [phone]

## 2015-12-20 NOTE — Therapy (Addendum)
[facility] [address] [place], [place], [address] Phone: [phone]   Fax:  [phone]  Physical Therapy Treatment  Patient Details  Name: April Campbell MRN: [id number] Date of Birth: [birth year] No Data Recorded  Encounter Date: 12/20/2015      PT End of Session - 12/20/15 0909    Visit Number (p) 2   Number of Visits (p) 12   Date for PT Re-Evaluation (p) 01/24/16   Authorization Type (p) [employer]   Authorization Time Period (p) 01-24-16   PT Start Time (p) 0900   PT Stop Time (p) 0959   PT Time Calculation (min) (p) 59 min   Activity Tolerance (p) Patient tolerated treatment well   Behavior During Therapy (p) WFL for tasks assessed/performed      Past Medical History  Diagnosis Date  . Epilepsy     last seizure - years ago  . History of migraine headaches   . GERD (gastroesophageal reflux disease)   . Carpal tunnel syndrome of right wrist 05/2015    Past Surgical History  Procedure Laterality Date  . Elbow surgery Left 2012  . Umbilical hernia repair      as an infant  . Colonoscopy with propofol N/A 02/10/2013    Procedure: COLONOSCOPY WITH PROPOFOL;  Surgeon: Charolett Bumpers, MD;  Location: [facility];  Service: Endoscopy;  Laterality: N/A;  . Diagnostic laparoscopy  12/15/2009    uterosacral nerve ablation  . Yag laser application  06/24/2006    vaporization of endometriosis  . Carpal tunnel release Right 06/14/2015    Procedure: RIGHT CARPAL TUNNEL RELEASE ENDOSCOPIC;  Surgeon: Mack Hook, MD;  Location: [facility];  Service: Orthopedics;  Laterality: Right;    There were no vitals filed for this visit.  Visit Diagnosis:  Musculoskeletal neck pain  Abnormal posture  Trigger point with neck pain      Subjective Assessment - 12/20/15 0904    Subjective Pt states she has been doing her neck stretches and shoulder.  she states she is sore after she does it but she feels good while  she does it.  I had a small headache last night but not radiating over my eye.   Pertinent History Seizures   Limitations Reading;Lifting;Other (comment)   Diagnostic tests Head EEG, Brain MRI tomorrow (seizure activity) , none in C spine    Patient Stated Goals To be pain free    Currently in Pain? Yes   Pain Score 8    Pain Location Neck   Pain Orientation Left;Right   Pain Descriptors / Indicators Aching;Sore   Pain Onset More than a month ago   Pain Frequency Several days a week                         [facility] Adult PT Treatment/Exercise - 12/20/15 0907    Moist Heat Therapy   Number Minutes Moist Heat 15 Minutes   Moist Heat Location Cervical   Manual Therapy   Manual Therapy Joint mobilization;Soft tissue mobilization;Myofascial release   Joint Mobilization Grade 2/3 for lateral PA to bil cervical and PA Grade 2/3 to thoracic T-2 to T-8   Soft tissue mobilization bil cervical paraspinals, sub occipital release , scalenes bil and upper trap and levator bil   Myofascial Release to bil Upper trap mx   Neck Exercises: Stretches   Upper Trapezius Stretch 2 reps;30 seconds  bil   Levator Stretch 2 reps;30  seconds          Trigger Point Dry Needling - 12/20/15 0908    Consent Given? Yes   Education Handout Provided Yes   Muscles Treated Upper Body Upper trapezius;Suboccipitals muscle group;Levator scapulae  bil for all needling with twitch Sternocleidomastoid twitch   Upper Trapezius Response Twitch reponse elicited;Palpable increased muscle length  bil scalenes and bil sternocleido mastoid.   SubOccipitals Response Twitch response elicited;Palpable increased muscle length   Levator Scapulae Response Twitch response elicited;Palpable increased muscle length      Pt closely monitored throughout session, Pt with palpable lengthening and decreased of muscle tightness post treatment          PT Education - 12/20/15 0909    Education provided Yes    Education Details Pt educated on trigger point dry needling and precautians and aftercare, reviewed neck stretches   Spegal(s) Educated Patient   Methods Explanation;Handout;Verbal cues;Demonstration   Comprehension Verbalized understanding;Returned demonstration          PT Short Term Goals - 12/13/15 1140    PT SHORT TERM GOAL #1   Title Pt will be able to demo corrected posture and report better awareness throughout the day   Time 2   Period Weeks   Status New   PT SHORT TERM GOAL #2   Title Pt with report pain in neck less intense, frequent, improved 25% or more   Time 2   Period Weeks   Status New           PT Long Term Goals - 12/13/15 1241    PT LONG TERM GOAL #1   Title Pt will be able to demo normal AROM in Cervical spine, pain free.    Time 6   Period Weeks   Status New   PT LONG TERM GOAL #2   Title Pt will be able to demo and understand concepts of posture, body mechanics and lifting (as it relates to gym exercises)   Time 6   Period Weeks   Status New   PT LONG TERM GOAL #3   Title Pt will be able to complete workouts without increase in pain    Time 6   Period Weeks   Status New   PT LONG TERM GOAL #4   Title FOTO score will decrease to less than 35% limited    Time 6   Period Weeks   Status New   PT LONG TERM GOAL #5   Title Pt will be able to report no more than 2/10 with daily activites, reading and work activities.    Time 6   Period Weeks   Status New               Plan - 12/20/15 1610    Clinical Impression Statement Pt consents to trigger point dry needling for trigger point pain on bil cervical, and sub occipital musculature.  Pt was able to tolerate and benefit from treatment session with decreased trigger point upon palpation post treament.  Will  coniinue to monitor for pain relief and continue with strengthening for  postural dysfunction.  Pt states she has changed her work station to benefit proper posture during work day.    Pt will benefit from skilled therapeutic intervention in order to improve on the following deficits Decreased range of motion;Increased fascial restricitons;Impaired UE functional use;Pain;Impaired flexibility;Improper body mechanics;Decreased mobility;Decreased strength;Postural dysfunction   Rehab Potential Excellent   PT Frequency 2x / week   PT Duration 6  weeks   PT Treatment/Interventions ADLs/Self Care Home Management;Ultrasound;Neuromuscular re-education;Passive range of motion;Dry needling;Cryotherapy;Iontophoresis /ml Dexamethasone;Moist Heat;Traction;Therapeutic exercise;Manual techniques;Functional mobility training   PT Next Visit Plan posture, scapular and cervical stab, chck tension stretches, assess dry needling  manual, NO IFC due to epilepsy   PT Home Exercise Plan neck tension stretches   Consulted and Agree with Plan of Care Patient        Problem List There are no active problems to display for this patient.  Garen Lah, PT 12/20/2015 9:49 AM Phone: [phone] Fax: [phone]  [facility] [address] [place], [place], [address] Phone: [phone]   Fax:  [phone]  Name: April Campbell MRN: [id number] Date of Birth: 1976/10/14

## 2015-12-22 ENCOUNTER — Encounter: Payer: BLUE CROSS/BLUE SHIELD | Admitting: Physical Therapy

## 2015-12-26 ENCOUNTER — Ambulatory Visit: Payer: BLUE CROSS/BLUE SHIELD | Admitting: Physical Therapy

## 2015-12-26 DIAGNOSIS — M542 Cervicalgia: Secondary | ICD-10-CM

## 2015-12-26 DIAGNOSIS — R293 Abnormal posture: Secondary | ICD-10-CM

## 2015-12-26 NOTE — Patient Instructions (Addendum)
Posture Tips DO: - stand tall and erect - keep chin tucked in - keep head and shoulders in alignment - check posture regularly in mirror or large window - pull head back against headrest in car seat;  Change your position often.  Sit with lumbar support. DON'T: - slouch or slump while watching TV or reading - sit, stand or lie in one position  for too long;  Sitting is especially hard on the spine so if you sit at a desk/use the computer, then stand up often!   Copyright  VHI. All rights reserved.  Posture - Standing   Good posture is important. Avoid slouching and forward head thrust. Maintain curve in low back and align ears over shoul- ders, hips over ankles.  Pull your belly button in toward your back bone. Stand with even weight in your toes and heels.  Lift rib cage up and chin down.  Not TEFL teacher. As shown in clinic Right Teressa????   Copyright  VHI. All rights reserved.  Posture - Sitting   Sit upright, head facing forward. Try using a roll to support lower back. Keep shoulders relaxed, and avoid rounded back. Keep hips level with knees. Avoid crossing legs for long periods.  Sit on sit bones and not tailbone.  Do not perch on edge of seat to type on computer. You should have shoulders relaxed.  Be careful about hyperextending your wrist.   Copyright  VHI. All rights reserved.  Sleeping on Back  Place pillow under knees. A pillow with cervical support and a roll around waist are also helpful. Copyright  VHI. All rights reserved.  Sleeping on Side Place pillow between knees. Use cervical support under neck and a roll around waist as needed. Copyright  VHI. All rights reserved.   Sleeping on Stomach   If this is the only desirable sleeping position, place pillow under lower legs, and under stomach or chest as needed.  Posture - Sitting   Sit upright, head facing forward. Try using a roll to support lower back. Keep shoulders relaxed, and avoid rounded back.  Keep hips level with knees. Avoid crossing legs for long periods. Stand to Sit / Sit to Stand   To sit: Bend knees to lower self onto front edge of chair, then scoot back on seat. To stand: Reverse sequence by placing one foot forward, and scoot to front of seat. Use rocking motion to stand up.   Work Height and Reach  Ideal work height is no more than 2 to 4 inches below elbow level when standing, and at elbow level when sitting. Reaching should be limited to arm's length, with elbows slightly bent.  Bending  Bend at hips and knees, not back. Keep feet shoulder-width apart.    Posture - Standing   Good posture is important. Avoid slouching and forward head thrust. Maintain curve in low back and align ears over shoul- ders, hips over ankles.  Alternating Positions   Alternate tasks and change positions frequently to reduce fatigue and muscle tension. Take rest breaks. Computer Work   Position work to Art gallery manager. Use proper work and seat height. Keep shoulders back and down, wrists straight, and elbows at right angles. Use chair that provides full back support. Add footrest and lumbar roll as needed.  Getting Into / Out of Car  Lower self onto seat, scoot back, then bring in one leg at a time. Reverse sequence to get out.  Dressing  Lie on back to pull  socks or slacks over feet, or sit and bend leg while keeping back straight.    Housework - Sink  Place one foot on ledge of cabinet under sink when standing at sink for prolonged periods.   Pushing / Pulling  Pushing is preferable to pulling. Keep back in proper alignment, and use leg muscles to do the work.  Deep Squat   Squat and lift with both arms held against upper trunk. Tighten stomach muscles without holding breath. Use smooth movements to avoid jerking.  Avoid Twisting   Avoid twisting or bending back. Pivot around using foot movements, and bend at knees if needed when reaching for articles.  Carrying  Luggage   Distribute weight evenly on both sides. Use a cart whenever possible. Do not twist trunk. Move body as a unit.   Lifting Principles .Maintain proper posture and head alignment. .Slide object as close as possible before lifting. .Move obstacles out of the way. .Test before lifting; ask for help if too heavy. .Tighten stomach muscles without holding breath. .Use smooth movements; do not jerk. .Use legs to do the work, and pivot with feet. .Distribute the work load symmetrically and close to the center of trunk. .Push instead of pull whenever possible.   Ask For Help   Ask for help and delegate to others when possible. Coordinate your movements when lifting together, and maintain the low back curve.  Log Roll   Lying on back, bend left knee and place left arm across chest. Roll all in one movement to the right. Reverse to roll to the left. Always move as one unit. Housework - Sweeping  Use long-handled equipment to avoid stooping.   Housework - Wiping  Position yourself as close as possible to reach work surface. Avoid straining your back.  Laundry - Unloading Wash   To unload small items at bottom of washer, lift leg opposite to arm being used to reach.  Gardening - Raking  Move close to area to be raked. Use arm movements to do the work. Keep back straight and avoid twisting.     Cart  When reaching into cart with one arm, lift opposite leg to keep back straight.   Getting Into / Out of Bed  Lower self to lie down on one side by raising legs and lowering head at the same time. Use arms to assist moving without twisting. Bend both knees to roll onto back if desired. To sit up, start from lying on side, and use same move-ments in reverse. Housework - Vacuuming  Hold the vacuum with arm held at side. Step back and forth to move it, keeping head up. Avoid twisting.   Laundry - Armed forces training and education officer so that bending and twisting can be  avoided.   Laundry - Unloading Dryer  Squat down to reach into clothes dryer or use a reacher.  Gardening - Weeding / Psychiatric nurse or Kneel. Knee pads may be helpful.                    April Campbell, PT 12/26/2015 11:53 AM Phone: [phone] Fax: [phone]

## 2015-12-26 NOTE — Therapy (Signed)
[facility] [address] [place], [place], [address] Phone: [phone]   Fax:  [phone]  Physical Therapy Treatment  Patient Details  Name: April Campbell MRN: [id number] Date of Birth: [birth year] No Data Recorded  Encounter Date: 12/26/2015      PT End of Session - 12/26/15 1138    Visit Number 3   Number of Visits 12   Date for PT Re-Evaluation 01/24/16   Authorization Type BC[employer] progress note sent 12-20-15   Authorization Time Period 01-24-16   PT Start Time 1140   PT Stop Time 1240   PT Time Calculation (min) 60 min   Activity Tolerance Patient tolerated treatment well   Behavior During Therapy [facility] for tasks assessed/performed      Past Medical History  Diagnosis Date  . Epilepsy     last seizure - years ago  . History of migraine headaches   . GERD (gastroesophageal reflux disease)   . Carpal tunnel syndrome of right wrist 05/2015    Past Surgical History  Procedure Laterality Date  . Elbow surgery Left 2012  . Umbilical hernia repair      as an infant  . Colonoscopy with propofol N/A 02/10/2013    Procedure: COLONOSCOPY WITH PROPOFOL;  Surgeon: Charolett Bumpers, MD;  Location: [facility];  Service: Endoscopy;  Laterality: N/A;  . Diagnostic laparoscopy  12/15/2009    uterosacral nerve ablation  . Yag laser application  06/24/2006    vaporization of endometriosis  . Carpal tunnel release Right 06/14/2015    Procedure: RIGHT CARPAL TUNNEL RELEASE ENDOSCOPIC;  Surgeon: Mack Hook, MD;  Location: [facility];  Service: Orthopedics;  Laterality: Right;    There were no vitals filed for this visit.  Visit Diagnosis:  Musculoskeletal neck pain  Abnormal posture  Trigger point with neck pain      Subjective Assessment - 12/26/15 1145    Subjective I am doing so much better  and not having constant pain. Now at 6/10.     Pertinent History Seizures   Limitations Reading;Lifting;Other  (comment)   Diagnostic tests Head EEG, Brain MRI tomorrow (seizure activity) , none in C spine    Patient Stated Goals To be pain free    Pain Score 6    Pain Location Neck   Pain Orientation Right;Left   Pain Descriptors / Indicators Aching;Sore   Pain Onset More than a month ago   Pain Frequency Intermittent            [facility] PT Assessment - 12/26/15 1222    AROM   Cervical Flexion 54  tightness   Cervical Extension 80   Cervical - Right Side Bend 50  tight    Cervical - Left Side Bend tight   Cervical - Right Rotation 65  tight   Cervical - Left Rotation 66                     [facility] Adult PT Treatment/Exercise - 12/26/15 1202    Self-Care   Self-Care Posture   Posture sitting and standing and ADL's lifting and work station Arboriculturist Therapy   Number Minutes Moist Heat 15 Minutes   Moist Heat Location Cervical   Manual Therapy   Manual Therapy Joint mobilization;Soft tissue mobilization;Myofascial release   Joint Mobilization Grade 2/3 for lateral PA to bil cervical and PA Grade 2/3 to thoracic T-2 to T-8   Soft tissue mobilization bil cervical  paraspinals, sub occipital release ,  bil and upper trap and levator bil   Myofascial Release to bil Upper trap mx/ levator          Trigger Point Dry Needling - 12/26/15 1201    Consent Given? Yes   Education Handout Provided No  previously given   Muscles Treated Upper Body Upper trapezius;Suboccipitals muscle group;Oblique capitus;Levator scapulae   Upper Trapezius Response Twitch reponse elicited;Palpable increased muscle length   Oblique Capitus Response Palpable increased muscle length   SubOccipitals Response Twitch response elicited;Palpable increased muscle length   Levator Scapulae Response Twitch response elicited;Palpable increased muscle length     right cervical paraspinals with more taut bands with palpable lengthening post treatment         PT Education - 12/26/15 1154     Education provided Yes   Education Details Pt educated on work station,  posture, sitting and standing and ADL's with handout   Mcleary(s) Educated Patient   Methods Explanation;Demonstration   Comprehension Verbalized understanding;Returned demonstration          PT Short Term Goals - 12/26/15 1200    PT SHORT TERM GOAL #1   Title Pt will be able to demo corrected posture and report better awareness throughout the day   Baseline Pt given education on posture and ADL's and work station on 1-61-09   Time 2   Period Weeks   Status On-going   PT SHORT TERM GOAL #2   Title Pt with report pain in neck less intense, frequent, improved 25% or more   Baseline 6/10 on 12-26-15   Time 2   Period Weeks   Status On-going           PT Long Term Goals - 12/26/15 1201    PT LONG TERM GOAL #1   Title Pt will be able to demo normal AROM in Cervical spine, pain free.    Time 6   Period Weeks   Status On-going   PT LONG TERM GOAL #2   Title Pt will be able to demo and understand concepts of posture, body mechanics and lifting (as it relates to gym exercises)   Time 6   Period Weeks   Status On-going   PT LONG TERM GOAL #3   Title Pt will be able to complete workouts without increase in pain    Time 6   Period Weeks   Status On-going   PT LONG TERM GOAL #4   Title FOTO score will decrease to less than 35% limited    Time 6   Period Weeks   Status On-going   PT LONG TERM GOAL #5   Title Pt will be able to report no more than 2/10 with daily activites, reading and work activities.    Time 6   Period Weeks   Status On-going               Plan - 12/26/15 1225    Clinical Impression Statement Pt with increased AROM of cervical with end range tightness.  See Assessment.  Headaches have decreased  from several times a week to 2 in last two weeks.  Pt benefits from dry needling and is compliant with stretches during the day.  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     Pt will benefit from skilled therapeutic intervention in order to improve on the following deficits  Decreased range of motion;Increased fascial restricitons;Impaired UE functional use;Pain;Impaired flexibility;Improper body mechanics;Decreased mobility;Decreased strength;Postural dysfunction   Rehab Potential Excellent   PT Frequency 2x / week   PT Duration 6 weeks   PT Treatment/Interventions ADLs/Self Care Home Management;Ultrasound;Neuromuscular re-education;Passive range of motion;Dry needling;Cryotherapy;Iontophoresis /ml Dexamethasone;Moist Heat;Traction;Therapeutic exercise;Manual techniques;Functional mobility training   PT Next Visit Plan NO IFC due to epilepsy,   Pt may need to progrees to scapular stabilizers if pain is subsiding begiining with supine and progressing .  continue TDN. 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   Garen Lah, PT 12/26/2015 12:34 PM Phone: [phone] Fax: [phone]  [facility] [address] [place], [place], [address] Phone: [phone]   Fax:  [phone]  Name: April Campbell MRN: [id number] Date of Birth: 1975/12/01

## 2015-12-27 ENCOUNTER — Encounter: Payer: BLUE CROSS/BLUE SHIELD | Admitting: Physical Therapy

## 2015-12-29 ENCOUNTER — Encounter: Payer: BLUE CROSS/BLUE SHIELD | Admitting: Physical Therapy

## 2016-01-09 ENCOUNTER — Ambulatory Visit: Payer: BLUE CROSS/BLUE SHIELD | Attending: Neurology | Admitting: Physical Therapy

## 2016-01-09 DIAGNOSIS — M542 Cervicalgia: Secondary | ICD-10-CM | POA: Diagnosis present

## 2016-01-09 DIAGNOSIS — R293 Abnormal posture: Secondary | ICD-10-CM | POA: Insufficient documentation

## 2016-01-09 NOTE — Therapy (Signed)
[facility] [address] [place], [place], [address] Phone: [phone]   Fax:  [phone]  Physical Therapy Treatment  Patient Details  Name: April Campbell MRN: [id number] Date of Birth: 01-Sep-1976 No Data Recorded  Encounter Date: 01/09/2016      PT End of Session - 01/09/16 1800    Visit Number 4   Number of Visits 12   Date for PT Re-Evaluation 01/24/16   Authorization Type BCBS progress note sent 12-20-15   PT Start Time 1545   PT Stop Time 1638   PT Time Calculation (min) 53 min   Activity Tolerance Patient tolerated treatment well   Behavior [facility] for tasks assessed/performed      Past Medical History  Diagnosis Date  . Epilepsy     last seizure - years ago  . History of migraine headaches   . GERD (gastroesophageal reflux disease)   . Carpal tunnel syndrome of right wrist 05/2015    Past Surgical History  Procedure Laterality Date  . Elbow surgery Left 2012  . Umbilical hernia repair      as an infant  . Colonoscopy with propofol N/A 02/10/2013    Procedure: COLONOSCOPY WITH PROPOFOL;  Surgeon: Charolett BumpersMartin K Johnson, MD;  Location: [facility];  Service: Endoscopy;  Laterality: N/A;  . Diagnostic laparoscopy  12/15/2009    uterosacral nerve ablation  . Yag laser application  06/24/2006    vaporization of endometriosis  . Carpal tunnel release Right 06/14/2015    Procedure: RIGHT CARPAL TUNNEL RELEASE ENDOSCOPIC;  Surgeon: Mack Hookavid Thompson, MD;  Location: [facility];  Service: Orthopedics;  Laterality: Right;    There were no vitals filed for this visit.  Visit Diagnosis:  Musculoskeletal neck pain  Abnormal posture  Trigger point with neck pain      Subjective Assessment - 01/09/16 1545    Subjective "things are improving, I am having small flair ups but it isn't bad"    Currently in Pain? Yes   Pain Score 5    Pain Location Neck   Pain Orientation Right;Left  R>L   Pain  Descriptors / Indicators Aching   Pain Type Chronic pain   Pain Frequency Intermittent   Aggravating Factors  prolonged sitting, not moving   Pain Relieving Factors moving around, heat, staying active, doing exercise.                          [facility] Adult PT Treatment/Exercise - 01/09/16 1554    Neck Exercises: Supine   Other Supine Exercise chin tucks x 10 with 5 sec hold   Moist Heat Therapy   Number Minutes Moist Heat 10 Minutes   Moist Heat Location Cervical  with pt in prone   Manual Therapy   Manual Therapy Taping   Joint Mobilization Grade 3 for cervical P>A C3-C7, and thoracic P>A T1-T7   Myofascial Release Rolling and stretching of the R upper trap   McConnell R upper trap inhibition taping trial   Neck Exercises: Stretches   Upper Trapezius Stretch 2 reps;30 seconds   Levator Stretch 2 reps;30 seconds          Trigger Point Dry Needling - 01/09/16 1553    Consent Given? Yes   Education Handout Provided No  given previously   Muscles Treated Upper Body Upper trapezius;Oblique capitus;Suboccipitals muscle group;Levator scapulae              PT  Education - 01/09/16 1804    Education provided Yes   Education Details Upper trap inhibition taping   Hoppel(s) Educated Patient   Methods Explanation   Comprehension Verbalized understanding          PT Short Term Goals - 01/09/16 1803    PT SHORT TERM GOAL #1   Title Pt will be able to demo corrected posture and report better awareness throughout the day   Time 2   Period Weeks   Status On-going   PT SHORT TERM GOAL #2   Title Pt with report pain in neck less intense, frequent, improved 25% or more   Time 2   Period Weeks   Status On-going           PT Long Term Goals - 01/09/16 1803    PT LONG TERM GOAL #1   Title Pt will be able to demo normal AROM in Cervical spine, pain free.    Time 6   Period Weeks   Status On-going   PT LONG TERM GOAL #2   Title Pt will be able to demo  and understand concepts of posture, body mechanics and lifting (as it relates to gym exercises)   Time 6   Period Weeks   Status On-going   PT LONG TERM GOAL #3   Title Pt will be able to complete workouts without increase in pain    Time 6   Period Weeks   Status On-going   PT LONG TERM GOAL #4   Title FOTO score will decrease to less than 35% limited    Time 6   Period Weeks   Status On-going   PT LONG TERM GOAL #5   Title Pt will be able to report no more than 2/10 with daily activites, reading and work activities.    Time 6   Period Weeks   Status On-going               Plan - 01/09/16 1801    Clinical Impression Statement Javia provided consent for dry needling of the R upper trap/ cervical multifidus and sub-occiptals wiith multiple palpable twitches and relief of tension following IASTM and myofascial techniquses. Attempted trial of upper trap inhibition taping to further promote relaxation. utliized MHP following todays session to relieve soreness from TPDN.    PT Next Visit Plan NO E-STIM due to epilepsy,   progrees to scapular stabilizersl, if pain is subsiding begiining with supine and progressing .  continue TDN PRN for pain relief and muscle tension relief   Consulted and Agree with Plan of Care Patient        Problem List There are no active problems to display for this patient.  Lulu Riding PT, DPT, LAT, ATC  01/09/2016  6:05 PM     [facility] [address] [place], [place], [address] Phone: [phone]   Fax:  [phone]  Name: April Campbell MRN: [id number] Date of Birth: [birth year]

## 2016-01-12 ENCOUNTER — Ambulatory Visit: Payer: BLUE CROSS/BLUE SHIELD | Admitting: Physical Therapy

## 2016-01-16 ENCOUNTER — Ambulatory Visit: Payer: BLUE CROSS/BLUE SHIELD | Admitting: Physical Therapy

## 2016-01-16 DIAGNOSIS — M542 Cervicalgia: Secondary | ICD-10-CM

## 2016-01-16 DIAGNOSIS — R293 Abnormal posture: Secondary | ICD-10-CM

## 2016-01-16 NOTE — Therapy (Signed)
[facility] [address] [place], [place], [address] Phone: [phone]   Fax:  [phone]  Physical Therapy Treatment  Patient Details  Name: April Campbell MRN: [id number] Date of Birth: 12-05-1975 No Data Recorded  Encounter Date: 01/16/2016      PT End of Session - 01/16/16 1727    Visit Number 5   Number of Visits 12   Date for PT Re-Evaluation 01/24/16   Authorization Type [employer] progress note sent 12-20-15   PT Start Time 1545   PT Stop Time 1636   PT Time Calculation (min) 51 min   Activity Tolerance Patient tolerated treatment well   Behavior [facility] for tasks assessed/performed      Past Medical History  Diagnosis Date  . Epilepsy     last seizure - years ago  . History of migraine headaches   . GERD (gastroesophageal reflux disease)   . Carpal tunnel syndrome of right wrist 05/2015    Past Surgical History  Procedure Laterality Date  . Elbow surgery Left 2012  . Umbilical hernia repair      as an infant  . Colonoscopy with propofol N/A 02/10/2013    Procedure: COLONOSCOPY WITH PROPOFOL;  Surgeon: Charolett BumpersMartin K Johnson, MD;  Location: [facility];  Service: Endoscopy;  Laterality: N/A;  . Diagnostic laparoscopy  12/15/2009    uterosacral nerve ablation  . Yag laser application  06/24/2006    vaporization of endometriosis  . Carpal tunnel release Right 06/14/2015    Procedure: RIGHT CARPAL TUNNEL RELEASE ENDOSCOPIC;  Surgeon: Mack Hookavid Thompson, MD;  Location: [facility];  Service: Orthopedics;  Laterality: Right;    There were no vitals filed for this visit.  Visit Diagnosis:  Musculoskeletal neck pain  Abnormal posture  Trigger point with neck pain      Subjective Assessment - 01/16/16 1544    Subjective "I am feeling better in the R side by the L side is feeling tight today"   Currently in Pain? Yes   Pain Score 6    Pain Location Neck   Pain Orientation Left   Pain  Descriptors / Indicators Aching   Pain Type Chronic pain   Pain Onset More than a month ago   Pain Frequency Intermittent   Aggravating Factors  prolonged sitting,    Pain Relieving Factors TPDN, heat,                          [facility] Adult PT Treatment/Exercise - 01/16/16 0001    Neck Exercises: Supine   Other Supine Exercise chin tucks x 10 with 5 sec hold   Other Supine Exercise scapular retraction x 10   Moist Heat Therapy   Number Minutes Moist Heat 10 Minutes   Moist Heat Location Cervical  pt in supine   Manual Therapy   Joint Mobilization Grade 3 for cervical P>A C3-C7, and thoracic P>A T1-T7   Soft tissue mobilization bil cervical paraspinals, sub occipital release ,  bil and upper trap and levator bil   Myofascial Release Rolling and stretching of the R upper trap   McConnell upper trap inhibition taping    Neck Exercises: Stretches   Upper Trapezius Stretch 2 reps;30 seconds   Levator Stretch 2 reps;30 seconds          Trigger Point Dry Needling - 01/16/16 1552    Consent Given? Yes   Education Handout Provided No   Muscles Treated  Upper Body Upper trapezius;Levator scapulae  L   Upper Trapezius Response Twitch reponse elicited;Palpable increased muscle length   Levator Scapulae Response Twitch response elicited;Palpable increased muscle length                PT Short Term Goals - 01/16/16 1730    PT SHORT TERM GOAL #1   Title Pt will be able to demo corrected posture and report better awareness throughout the day   Time 2   Period Weeks   Status Achieved   PT SHORT TERM GOAL #2   Title Pt with report pain in neck less intense, frequent, improved 25% or more   Time 2   Period Weeks   Status On-going           PT Long Term Goals - 01/09/16 1803    PT LONG TERM GOAL #1   Title Pt will be able to demo normal AROM in Cervical spine, pain free.    Time 6   Period Weeks   Status On-going   PT LONG TERM GOAL #2   Title Pt will  be able to demo and understand concepts of posture, body mechanics and lifting (as it relates to gym exercises)   Time 6   Period Weeks   Status On-going   PT LONG TERM GOAL #3   Title Pt will be able to complete workouts without increase in pain    Time 6   Period Weeks   Status On-going   PT LONG TERM GOAL #4   Title FOTO score will decrease to less than 35% limited    Time 6   Period Weeks   Status On-going   PT LONG TERM GOAL #5   Title Pt will be able to report no more than 2/10 with daily activites, reading and work activities.    Time 6   Period Weeks   Status On-going               Plan - 01/16/16 1728    Clinical Impression Statement April Campbell reported having increased soreness in the L upper trap region with the R doing better since the last session. TPDN was performed were mulitple twitches were palpable and she reported relief following IASTM and IASTM and mobs. She reported increased relief with upper trap inhibition taping bil. plan to progress with exercises next visit.    PT Next Visit Plan NO E-STIM due to epilepsy,   progrees to scapular stabilizersl, if pain is subsiding begiining with supine and progressing .  continue TDN PRN for pain relief and muscle tension relief, progress with exercises.  measure ROM   Consulted and Agree with Plan of Care Patient        Problem List There are no active problems to display for this patient.  Lulu Riding PT, DPT, LAT, ATC  01/16/2016  5:31 PM      [facility] [address] [place], [place], [address] Phone: [phone]   Fax:  [phone]  Name: April Campbell MRN: [id number] Date of Birth: 03/25/1976

## 2016-01-18 ENCOUNTER — Ambulatory Visit: Payer: BLUE CROSS/BLUE SHIELD | Admitting: Physical Therapy

## 2016-01-18 DIAGNOSIS — M542 Cervicalgia: Secondary | ICD-10-CM | POA: Diagnosis not present

## 2016-01-18 DIAGNOSIS — R293 Abnormal posture: Secondary | ICD-10-CM

## 2016-01-18 NOTE — Therapy (Signed)
[facility] [address] [place], [place], [address] Phone: [phone]   Fax:  [phone]  Physical Therapy Treatment  Patient Details  Name: Brigida Scotti Binegar MRN: [id number] Date of Birth: Jun 25, 1976 No Data Recorded  Encounter Date: 01/18/2016      PT End of Session - 01/18/16 1548    Visit Number 6   Number of Visits 12   Date for PT Re-Evaluation 01/24/16   PT Start Time 1545   PT Stop Time 1635   PT Time Calculation (min) 50 min   Activity Tolerance Patient tolerated treatment well   Behavior During Therapy [facility] for tasks assessed/performed      Past Medical History  Diagnosis Date  . Epilepsy     last seizure - years ago  . History of migraine headaches   . GERD (gastroesophageal reflux disease)   . Carpal tunnel syndrome of right wrist 05/2015    Past Surgical History  Procedure Laterality Date  . Elbow surgery Left 2012  . Umbilical hernia repair      as an infant  . Colonoscopy with propofol N/A 02/10/2013    Procedure: COLONOSCOPY WITH PROPOFOL;  Surgeon: Charolett Bumpers, MD;  Location: [facility];  Service: Endoscopy;  Laterality: N/A;  . Diagnostic laparoscopy  12/15/2009    uterosacral nerve ablation  . Yag laser application  06/24/2006    vaporization of endometriosis  . Carpal tunnel release Right 06/14/2015    Procedure: RIGHT CARPAL TUNNEL RELEASE ENDOSCOPIC;  Surgeon: Mack Hook, MD;  Location: [facility];  Service: Orthopedics;  Laterality: Right;    There were no vitals filed for this visit.  Visit Diagnosis:  Musculoskeletal neck pain  Abnormal posture  Trigger point with neck pain      Subjective Assessment - 01/18/16 1548    Subjective "I was in some much soreness last night, and this morning but I am doing much better this morning"  she reports doing exercises, stretching and taking advil and it helped.    Currently in Pain? Yes   Pain Score 5    Pain Location  Neck   Pain Orientation Left   Pain Descriptors / Indicators Sore;Tightness   Pain Type Chronic pain   Pain Onset More than a month ago   Pain Frequency Intermittent   Aggravating Factors  unknown                         [facility] Adult PT Treatment/Exercise - 01/18/16 1618    Neck Exercises: Machines for Strengthening   UBE (Upper Arm Bike) L 1 x 5 min   Neck Exercises: Seated   Other Seated Exercise thoracic extension over back of chair, using hands to push from arm rest 5 x 5 sec hold   Neck Exercises: Supine   Other Supine Exercise chin tucks x 10 with 5 sec hold   Other Supine Exercise scapular retraction x 10   Moist Heat Therapy   Number Minutes Moist Heat 10 Minutes   Moist Heat Location Cervical  L upper trap in supine   Manual Therapy   Joint Mobilization Grade 3 for cervical P>A C3-C7, and thoracic P>A T1-T7   Myofascial Release Rolling and stretching of the R upper trap   McConnell upper trap inhibition taping   L only today          Trigger Point Dry Needling - 01/18/16 1619    Muscles Treated Upper Body  Upper trapezius;Oblique capitus  on L Left   Upper Trapezius Response Twitch reponse elicited;Palpable increased muscle length   Oblique Capitus Response Twitch response elicited;Palpable increased muscle length   SubOccipitals Response Twitch response elicited;Palpable increased muscle length              PT Education - 01/18/16 1621    Education provided Yes   Education Details updated HEP   [place]man(s) Educated Patient   Methods Explanation   Comprehension Verbalized understanding          PT Short Term Goals - 01/16/16 1730    PT SHORT TERM GOAL #1   Title Pt will be able to demo corrected posture and report better awareness throughout the day   Time 2   Period Weeks   Status Achieved   PT SHORT TERM GOAL #2   Title Pt with report pain in neck less intense, frequent, improved 25% or more   Time 2   Period Weeks   Status  On-going           PT Long Term Goals - 01/09/16 1803    PT LONG TERM GOAL #1   Title Pt will be able to demo normal AROM in Cervical spine, pain free.    Time 6   Period Weeks   Status On-going   PT LONG TERM GOAL #2   Title Pt will be able to demo and understand concepts of posture, body mechanics and lifting (as it relates to gym exercises)   Time 6   Period Weeks   Status On-going   PT LONG TERM GOAL #3   Title Pt will be able to complete workouts without increase in pain    Time 6   Period Weeks   Status On-going   PT LONG TERM GOAL #4   Title FOTO score will decrease to less than 35% limited    Time 6   Period Weeks   Status On-going   PT LONG TERM GOAL #5   Title Pt will be able to report no more than 2/10 with daily activites, reading and work activities.    Time 6   Period Weeks   Status On-going               Plan - 01/18/16 1751    Clinical Impression Statement Milea reports that she is doing better in the R shoulder todya but still has some pain the L shoulder. Following TPDN of the L shoulder and IASTM she reported relief. Performed upper trap inhibition taping which she continues to report relief with. she was able to peroform exercises with no report of pain except for sorness from the needling. utilized MHP post session to decreased soreness.    PT Next Visit Plan NO E-STIM due to epilepsy,   progrees to scapular stabilizersl, if pain is subsiding begiining with supine and progressing .  continue TDN PRN for pain relief and muscle tension relief, progress with exercises.  measure ROM   PT Home Exercise Plan I's,T's, Y's, thoracic extension over chair   Consulted and Agree with Plan of Care Patient        Problem List There are no active problems to display for this patient.  Lulu RidingKristoffer Leamon PT, DPT, LAT, ATC  01/18/2016  5:56 PM      [facility] [address] [place], [place],  [address] Phone: [phone]   Fax:  [phone]  Name: Charm BargesSusan L Delfavero MRN: [id number] Date of  Birth: 1975-12-06

## 2016-01-18 NOTE — Patient Instructions (Signed)
   2 x 10 each direction with 1-2# perform on both sides.

## 2016-01-21 DIAGNOSIS — G40209 Localization-related (focal) (partial) symptomatic epilepsy and epileptic syndromes with complex partial seizures, not intractable, without status epilepticus: Secondary | ICD-10-CM | POA: Insufficient documentation

## 2016-01-23 ENCOUNTER — Ambulatory Visit: Payer: BLUE CROSS/BLUE SHIELD | Admitting: Physical Therapy

## 2016-01-23 DIAGNOSIS — M542 Cervicalgia: Secondary | ICD-10-CM | POA: Diagnosis not present

## 2016-01-23 DIAGNOSIS — R293 Abnormal posture: Secondary | ICD-10-CM

## 2016-01-23 NOTE — Therapy (Signed)
[place], [place], [address] Phone: [phone]   Fax:  [phone]  Physical Therapy Treatment / Re-certification  Patient Details  Name: April Campbell MRN: [id number] Date of Birth: [birth year] No Data Recorded  Encounter Date: 01/23/2016      PT End of Session - 01/23/16 1602    Visit Number 7   Number of Visits 12   Date for PT Re-Evaluation 02/27/16   PT Start Time 4562   PT Stop Time 1641   PT Time Calculation (min) 56 min   Activity Tolerance Patient tolerated treatment well   Behavior During Therapy [facility] for tasks assessed/performed      Past Medical History  Diagnosis Date  . Epilepsy     last seizure - years ago  . History of migraine headaches   . GERD (gastroesophageal reflux disease)   . Carpal tunnel syndrome of right wrist 05/2015    Past Surgical History  Procedure Laterality Date  . Elbow surgery Left 2012  . Umbilical hernia repair      as an infant  . Colonoscopy with propofol N/A 02/10/2013    Procedure: COLONOSCOPY WITH PROPOFOL;  Surgeon: Garlan Fair, MD;  Location: [facility];  Service: Endoscopy;  Laterality: N/A;  . Diagnostic laparoscopy  12/15/2009    uterosacral nerve ablation  . Yag laser application  5/63/8937    vaporization of endometriosis  . Carpal tunnel release Right 06/14/2015    Procedure: RIGHT CARPAL TUNNEL RELEASE ENDOSCOPIC;  Surgeon: Milly Jakob, MD;  Location: [place];  Service: Orthopedics;  Laterality: Right;    There were no vitals filed for this visit.  Visit Diagnosis:  Musculoskeletal neck pain - Plan: PT plan of care cert/re-cert  Abnormal posture - Plan: PT plan of care cert/re-cert  Trigger point with neck pain - Plan: PT plan of care cert/re-cert      Subjective Assessment - 01/23/16 1550    Subjective "I am doing much better today, I can really tell that my pain is more stress"   Currently in Pain? Yes    Pain Score 2    Pain Location Neck   Pain Orientation Left   Pain Descriptors / Indicators Tightness   Pain Type Chronic pain   Pain Onset More than a month ago   Pain Frequency Intermittent   Aggravating Factors  stress,    Pain Relieving Factors DN, heat, exercise            [facility] PT Assessment - 01/23/16 1553    Observation/Other Assessments   Focus on Therapeutic Outcomes (FOTO)  37% limited   AROM   Cervical Flexion 60  tightness at end range   Cervical Extension 80   Cervical - Right Side Bend 48   Cervical - Left Side Bend 48  crunchy leaning to the left   Cervical - Right Rotation 58   Cervical - Left Rotation 62   Strength   Right Shoulder Flexion 4+/5   Right Shoulder ABduction 5/5   Left Shoulder Flexion 4/5   Left Shoulder ABduction 4/5   Right Hand Grip (lbs) 55  34,28,76   Left Hand Grip (lbs) 51  60,48,46                     [facility] Adult PT Treatment/Exercise - 01/23/16 0001    Self-Care   Self-Care Other Self-Care Comments   Other Self-Care Comments  how to relieve trigger points  manually using tools such as tennis balls or theracane and if she liked the theracane where she can purchase one.    Neck Exercises: Machines for Strengthening   UBE (Upper Arm Bike) L 1 x 6 min   Neck Exercises: Seated   Other Seated Exercise thoracic extension over back of chair, using hands to push from arm rest 5 x 5 sec hold   Neck Exercises: Supine   Other Supine Exercise chin tucks x 10 with 5 sec hold   Other Supine Exercise scapular retraction x 10 with 5 sec hold   Moist Heat Therapy   Number Minutes Moist Heat 10 Minutes   Moist Heat Location Shoulder  bil upper traps   Manual Therapy   Manual therapy comments manual trigger point release of the Rand L upper trap x 3 areas    Neck Exercises: Stretches   Upper Trapezius Stretch 2 reps;30 seconds   Levator Stretch 2 reps;30 seconds                PT Education - 01/23/16 1641     Education provided Yes   Education Details HEP review, manual trigger point education   Liberatore(s) Educated Patient   Methods Explanation   Comprehension Verbalized understanding          PT Short Term Goals - 01/23/16 1606    PT SHORT TERM GOAL #1   Title Pt will be able to demo corrected posture and report better awareness throughout the day   Time 2   Period Weeks   Status Achieved   PT SHORT TERM GOAL #2   Title Pt with report pain in neck less intense, frequent, improved 25% or more   Time 2   Period Weeks   Status Achieved           PT Long Term Goals - 01/23/16 1606    PT LONG TERM GOAL #1   Title Pt will be able to demo normal AROM in Cervical spine, pain free.    Time 6   Period Weeks   Status Partially Met   PT LONG TERM GOAL #2   Title Pt will be able to demo and understand concepts of posture, body mechanics and lifting (as it relates to gym exercises)   Time 6   Period Weeks   Status Achieved   PT LONG TERM GOAL #3   Title Pt will be able to complete workouts without increase in pain    Time 6   Period Weeks   Status Partially Met   PT LONG TERM GOAL #4   Title FOTO score will decrease to less than 35% limited    Time 6   Period Weeks   Status On-going   PT LONG TERM GOAL #5   Title Pt will be able to report no more than 2/10 with daily activites, reading and work activities.    Time 6   Period Weeks   Status Partially Met               Plan - 01/23/16 1611    Clinical Impression Statement Loralei reported decrease pain inthe shoulders and states that the R shoulder is doing better and that the L shoulder to feel tight. she continues to maintain her cervical mobiltiy, with mild reduction with rotation and side bending but remains WFL. she has met  ST G #2, and LTG #2, and has partially met LTG #1,3,5. Educated about manual trigger point release to she can  continue to work on it at home. Plan to drop to 1 a x week for the next 5 weeks to  improve on decrease pain, shoulder strength, decreased spasm/ trigger points and work on remaining goals and independent exercise   Pt will benefit from skilled therapeutic intervention in order to improve on the following deficits Decreased range of motion;Increased fascial restricitons;Impaired UE functional use;Pain;Impaired flexibility;Improper body mechanics;Decreased mobility;Decreased strength;Postural dysfunction   Rehab Potential Excellent   PT Frequency 1x / week   PT Duration --  5 weeks   PT Treatment/Interventions ADLs/Self Care Home Management;Ultrasound;Neuromuscular re-education;Passive range of motion;Dry needling;Cryotherapy;Iontophoresis 19m/ml Dexamethasone;Moist Heat;Traction;Therapeutic exercise;Manual techniques;Functional mobility training   PT Next Visit Plan NO E-STIM due to epilepsy,   progrees to scapular stabilizersl, if pain is subsiding begiining with supine and progressing .  continue TDN PRN for pain relief and muscle tension relief, progress with exercises.  measure ROM   Consulted and Agree with Plan of Care Patient        Problem List There are no active problems to display for this patient.  KStarr LakePT, DPT, LAT, ATC  01/23/2016  4:56 PM      [facility] [address]Phone: [phone]  Fax:  [phone] Name: April Campbell MRN: [id number] of Birth: 208/20/1977

## 2016-01-25 ENCOUNTER — Ambulatory Visit: Payer: BLUE CROSS/BLUE SHIELD | Admitting: Physical Therapy

## 2016-01-25 DIAGNOSIS — M542 Cervicalgia: Secondary | ICD-10-CM

## 2016-01-25 DIAGNOSIS — R293 Abnormal posture: Secondary | ICD-10-CM

## 2016-01-25 NOTE — Therapy (Signed)
[place], [place], [address] Phone: [phone]   Fax:  [phone]  Physical Therapy Treatment  Patient Details  Name: April Campbell MRN: [id number] Date of Birth: 1976-01-03 No Data Recorded  Encounter Date: 01/25/2016      PT End of Session - 01/25/16 1728    Visit Number 8   Number of Visits 12   Date for PT Re-Evaluation 02/27/16   Authorization Type [employer] progress note sent 12-20-15   PT Start Time 1545   PT Stop Time 1638   PT Time Calculation (min) 53 min   Activity Tolerance Patient tolerated treatment well   Behavior During Therapy [facility] for tasks assessed/performed      Past Medical History  Diagnosis Date  . Epilepsy     last seizure - years ago  . History of migraine headaches   . GERD (gastroesophageal reflux disease)   . Carpal tunnel syndrome of right wrist 05/2015    Past Surgical History  Procedure Laterality Date  . Elbow surgery Left 2012  . Umbilical hernia repair      as an infant  . Colonoscopy with propofol N/A 02/10/2013    Procedure: COLONOSCOPY WITH PROPOFOL;  Surgeon: Garlan Fair, MD;  [facility];  Service: Endoscopy;  Laterality: N/A;  . Diagnostic laparoscopy  12/15/2009    uterosacral nerve ablation  . Yag laser application  05/17/9469    vaporization of endometriosis  . Carpal tunnel release Right 06/14/2015    Procedure: RIGHT CARPAL TUNNEL RELEASE ENDOSCOPIC;  Surgeon: Milly Jakob, MD;  Location: [place];  Service: Orthopedics;  Laterality: Right;    There were no vitals filed for this visit.  Visit Diagnosis:  Musculoskeletal neck pain  Abnormal posture  Trigger point with neck pain      Subjective Assessment - 01/25/16 1552    Subjective " I have headache today but I think it is allergy related"    Currently in Pain? Yes   Pain Score 7    Pain Location Neck   Pain Orientation Left   Pain Descriptors /  Indicators Aching;Tightness   Pain Type Chronic pain   Pain Onset More than a month ago   Pain Frequency Intermittent                         [facility] Adult PT Treatment/Exercise - 01/25/16 1555    Self-Care   Other Self-Care Comments  educated how to do sub-occipital release at home using tennis balls to decrease tension in the neck reduce headaches   Neck Exercises: Machines for Strengthening   UBE (Upper Arm Bike) L 1 x 6 min  changing direction every 3 min   Moist Heat Therapy   Number Minutes Moist Heat 10 Minutes   Moist Heat Location Shoulder   Manual Therapy   Manual therapy comments sub-occipital release    Joint Mobilization Grade 3 for cervical P>A C3-C7, and thoracic P>A T1-T7   Neck Exercises: Stretches   Upper Trapezius Stretch 2 reps;30 seconds   Levator Stretch 2 reps;30 seconds          Trigger Point Dry Needling - 01/25/16 1725    Consent Given? Yes   Education Handout Provided No  given previously   Muscles Treated Upper Body Upper trapezius   Upper Trapezius Response Twitch reponse elicited;Palpable increased muscle length  PT Education - 01/25/16 1727    Education provided Yes   Education Details how to relieve tension with chin tucks and sub-occipital release techniques   Mirelez(s) Educated Patient   Methods Explanation   Comprehension Verbalized understanding          PT Short Term Goals - 01/23/16 1606    PT SHORT TERM GOAL #1   Title Pt will be able to demo corrected posture and report better awareness throughout the day   Time 2   Period Weeks   Status Achieved   PT SHORT TERM GOAL #2   Title Pt with report pain in neck less intense, frequent, improved 25% or more   Time 2   Period Weeks   Status Achieved           PT Long Term Goals - 01/23/16 1606    PT LONG TERM GOAL #1   Title Pt will be able to demo normal AROM in Cervical spine, pain free.    Time 6   Period Weeks   Status Partially Met    PT LONG TERM GOAL #2   Title Pt will be able to demo and understand concepts of posture, body mechanics and lifting (as it relates to gym exercises)   Time 6   Period Weeks   Status Achieved   PT LONG TERM GOAL #3   Title Pt will be able to complete workouts without increase in pain    Time 6   Period Weeks   Status Partially Met   PT LONG TERM GOAL #4   Title FOTO score will decrease to less than 35% limited    Time 6   Period Weeks   Status On-going   PT LONG TERM GOAL #5   Title Pt will be able to report no more than 2/10 with daily activites, reading and work activities.    Time 6   Period Weeks   Status Partially Met               Plan - 01/25/16 1728    Clinical Impression Statement Jillisa reported increased pain in the L shoulder today with a headache. performed sub-occipital release which she reported relief of her headache, in combination with chin tucks. following DN of the L upper trap and IASTM she reported no headache with pain rated a 4/10. utilied MHP post session to decrease soreness from the dry needling.    PT Next Visit Plan NO E-STIM due to epilepsy,   progrees to scapular stabilizersl, if pain is subsiding begiining with supine and progressing .  continue TDN PRN for pain relief and muscle tension relief, progress with exercises.  measure ROM   Consulted and Agree with Plan of Care Patient        Problem List There are no active problems to display for this patient.  Starr Lake PT, DPT, LAT, ATC  01/25/2016  5:35 PM      [facility] [address] [place], [place], [address] Phone: [phone]   Fax:  [phone]  Name: April Campbell MRN: [id number] Date of Birth: [birth year]

## 2016-02-08 ENCOUNTER — Ambulatory Visit: Payer: BLUE CROSS/BLUE SHIELD | Admitting: Physical Therapy

## 2016-02-13 ENCOUNTER — Ambulatory Visit: Payer: BLUE CROSS/BLUE SHIELD | Attending: Neurology | Admitting: Physical Therapy

## 2016-02-13 DIAGNOSIS — M542 Cervicalgia: Secondary | ICD-10-CM | POA: Insufficient documentation

## 2016-02-13 DIAGNOSIS — R252 Cramp and spasm: Secondary | ICD-10-CM | POA: Diagnosis present

## 2016-02-13 DIAGNOSIS — R293 Abnormal posture: Secondary | ICD-10-CM | POA: Insufficient documentation

## 2016-02-13 NOTE — Patient Instructions (Signed)
  2 x 10 each direction with 1 lb. Weight

## 2016-02-13 NOTE — Therapy (Signed)
[place], [place], [address] Phone: [phone]   Fax:  [phone]  Physical Therapy Treatment  Patient Details  Name: April Campbell MRN: [id number] Date of Birth: October 08, 1976 No Data Recorded  Encounter Date: 02/13/2016      PT End of Session - 02/13/16 1548    Visit Number 9   Number of Visits 12   Date for PT Re-Evaluation 02/27/16   Authorization Type [employer] progress note sent 12-20-15   PT Start Time 1500   PT Stop Time 1546   PT Time Calculation (min) 46 min   Activity Tolerance Patient tolerated treatment well   Behavior During Therapy [facility] for tasks assessed/performed      Past Medical History  Diagnosis Date  . Epilepsy     last seizure - years ago  . History of migraine headaches   . GERD (gastroesophageal reflux disease)   . Carpal tunnel syndrome of right wrist 05/2015    Past Surgical History  Procedure Laterality Date  . Elbow surgery Left 2012  . Umbilical hernia repair      as an infant  . Colonoscopy with propofol N/A 02/10/2013    Procedure: COLONOSCOPY WITH PROPOFOL;  Surgeon: Garlan Fair, MD;  Location: [facility];  Service: Endoscopy;  Laterality: N/A;  . Diagnostic laparoscopy  12/15/2009    uterosacral nerve ablation  . Yag laser application  1/85/6314    vaporization of endometriosis  . Carpal tunnel release Right 06/14/2015    Procedure: RIGHT CARPAL TUNNEL RELEASE ENDOSCOPIC;  Surgeon: Milly Jakob, MD;  Location: [place];  Service: Orthopedics;  Laterality: Right;    There were no vitals filed for this visit.      Subjective Assessment - 02/13/16 1506    Subjective "I missed last time because I was sick, i've been doing pretty well" pt reports she was staying consistent with her HEP   Currently in Pain? Yes   Pain Score 2    Pain Location Neck   Pain Orientation Left   Pain Descriptors / Indicators Tightness   Pain Type Chronic pain    Aggravating Factors  stress   Pain Relieving Factors DN, Heat exercise                         [facility] Adult PT Treatment/Exercise - 02/13/16 1511    Neck Exercises: Machines for Strengthening   UBE (Upper Arm Bike) L 1 x 6 min  changing direction at 3 min   Neck Exercises: Standing   Lift / Chop 15 reps;Theraband  2 x 15 each way   Theraband Level (Lift/Chop) Level 2 (Red)   Other Standing Exercises horizontal abduction against bolster 2 x 15  with green theraband   Shoulder Exercises: Prone   Other Prone Exercises I's. T's, Y's 1#, 2 x 10  performed bil   Neck Exercises: Stretches   Upper Trapezius Stretch 2 reps;30 seconds   Levator Stretch 2 reps;30 seconds                PT Education - 02/13/16 1548    Education provided Yes   Education Details updated HEP   Dubose(s) Educated Patient   Methods Explanation   Comprehension Verbalized understanding          PT Short Term Goals - 01/23/16 1606    PT SHORT TERM GOAL #1   Title Pt will be able to demo corrected  posture and report better awareness throughout the day   Time 2   Period Weeks   Status Achieved   PT SHORT TERM GOAL #2   Title Pt with report pain in neck less intense, frequent, improved 25% or more   Time 2   Period Weeks   Status Achieved           PT Long Term Goals - 02/13/16 1551    PT LONG TERM GOAL #1   Title Pt will be able to demo normal AROM in Cervical spine, pain free.    Time 6   Period Weeks   Status Partially Met   PT LONG TERM GOAL #2   Title Pt will be able to demo and understand concepts of posture, body mechanics and lifting (as it relates to gym exercises)   Time 6   Period Weeks   Status Achieved   PT LONG TERM GOAL #3   Title Pt will be able to complete workouts without increase in pain    Time 6   Period Weeks   Status Partially Met   PT LONG TERM GOAL #4   Title FOTO score will decrease to less than 35% limited    Time 6   Period Weeks    Status On-going   PT LONG TERM GOAL #5   Title Pt will be able to report no more than 2/10 with daily activites, reading and work activities.    Time 6   Period Weeks   Status Achieved               Plan - 02/13/16 1548    Clinical Impression Statement April Campbell reports she is doing well the last 2 weeks with 0-2/10 pain. She has been progressing with her personal gym routine and been consistent with her HEP. Focused todays session on scapular stabilzer exercises which pt reported no pain during or following todays session. She declined MHP today and reported pain dropped to 0/10.    PT Frequency 1x / week   PT Duration --  5 weeks   PT Treatment/Interventions ADLs/Self Care Home Management;Ultrasound;Neuromuscular re-education;Passive range of motion;Dry needling;Cryotherapy;Iontophoresis 62m/ml Dexamethasone;Moist Heat;Traction;Therapeutic exercise;Manual techniques;Functional mobility training   PT Next Visit Plan NO E-STIM due to epilepsy, scapular stabilzer exercises, Manual PRN, stretching/ thoracic mobility, [id number] updated.    PT Home Exercise Plan I's, T's, Y's, horizontal abduction, D2 pattern supine   Consulted and Agree with Plan of Care Patient      Patient will benefit from skilled therapeutic intervention in order to improve the following deficits and impairments:  Decreased range of motion, Increased fascial restricitons, Impaired UE functional use, Pain, Impaired flexibility, Improper body mechanics, Decreased mobility, Decreased strength, Postural dysfunction  Visit Diagnosis: Cervicalgia - Plan: PT plan of care cert/re-cert  Abnormal posture - Plan: PT plan of care cert/re-cert  Cramp and spasm - Plan: PT plan of care cert/re-cert     Problem List There are no active problems to display for this patient.  KStarr LakePT, DPT, LAT, ATC  02/13/2016  4:00 PM      [facility] [address]Phone: [phone]  Fax:  [phone] Name: SLindie RobersonPerson MRN: [id number] of Birth: [birth year]

## 2016-02-17 ENCOUNTER — Ambulatory Visit (INDEPENDENT_AMBULATORY_CARE_PROVIDER_SITE_OTHER): Payer: BLUE CROSS/BLUE SHIELD | Admitting: Certified Nurse Midwife

## 2016-02-17 ENCOUNTER — Encounter: Payer: Self-pay | Admitting: Certified Nurse Midwife

## 2016-02-17 VITALS — BP 100/64 | HR 70 | Resp 16 | Ht 65.5 in | Wt 193.0 lb

## 2016-02-17 DIAGNOSIS — N926 Irregular menstruation, unspecified: Secondary | ICD-10-CM

## 2016-02-17 DIAGNOSIS — N852 Hypertrophy of uterus: Secondary | ICD-10-CM | POA: Diagnosis not present

## 2016-02-17 DIAGNOSIS — N938 Other specified abnormal uterine and vaginal bleeding: Secondary | ICD-10-CM | POA: Diagnosis not present

## 2016-02-17 LAB — POCT URINE PREGNANCY: Preg Test, Ur: NEGATIVE

## 2016-02-17 LAB — PROLACTIN: Prolactin: 10.6 ng/mL

## 2016-02-17 LAB — TSH: TSH: 1.24 m[IU]/L

## 2016-02-17 NOTE — Patient Instructions (Signed)

## 2016-02-17 NOTE — Progress Notes (Signed)
Subjective:     Patient ID: April Campbell, female   DOB: [birth year], 40 y.o. g0p0   MRN: [id number]  HPI  40 yo white married  female g0 p0 here with complaint of increasing length of cycles with 32 to 40 day cycle with 7 day duration with this cycle change for the past 6 months. Patient has not changed medication, but has lost 38 pounds in weight intentional. Last period began on 02-02-16, ended on after 7 days and had spotting on 4/15 and again on from 4-18 to 21. Some cramping with bleeding. Cycles have been heavier due to longer interval between cycles. Contraception spouse vasectomy. No other health concerns today. POCT UPT negative  POCT Hgb.11.9  Review of Systems  Constitutional: Negative.   Genitourinary: Negative for vaginal bleeding, vaginal discharge, vaginal pain and pelvic pain.  Neurological: Negative for dizziness and weakness.       Objective:   Physical Exam  Constitutional: She is oriented to [place], place, and time. She appears well-developed and well-nourished.  Neck: No tracheal deviation present. No thyromegaly present.  Abdominal: Soft. She exhibits no mass. There is no tenderness.  Genitourinary: Vagina normal. There is no tenderness or lesion on the right labia. There is no tenderness or lesion on the left labia. Uterus is enlarged. 12 week size with questionable right adnexal mass Right adnexum displays fullness. Right adnexum displays no tenderness. Left adnexum displays no tenderness and no fullness. No vaginal discharge found.  Lymphadenopathy:       Right: No inguinal adenopathy present.       Left: No inguinal adenopathy present.  Neurological: She is alert and oriented to [place], place, and time.  Psychiatric: She has a normal mood and affect. Her behavior is normal. Judgment and thought content normal.  Vitals reviewed.      Assessment:     DUB with longer cycles regular cycles Normal pelvic exam Enlarged uterus with questionable right adnexal mass   Intentional 38 pound weight loss    Plan:     Discussed cycles can change related to weight change, thyroid or pituitary changes. Recommend labs to assess, agreeable. Lab: TSH, Prolactin Discussed finding with patient of enlarged uterus ? Adnexal mass on right and need for evaluation with PUS.Marland Kitchen. Patient agreeable. Patient will be called with insurance information and scheduled for PUS. Questions addressed for etiology of possible fibroid, adenomyosis.  Warning signs given for heavy bleeding and need to advise.  Rv as above, prn

## 2016-02-20 LAB — HEMOGLOBIN, FINGERSTICK: Hemoglobin, fingerstick: 11.9 g/dL — ABNORMAL LOW (ref 12.0–16.0)

## 2016-02-21 ENCOUNTER — Encounter: Payer: BLUE CROSS/BLUE SHIELD | Admitting: Physical Therapy

## 2016-02-21 ENCOUNTER — Telehealth: Payer: Self-pay | Admitting: Obstetrics & Gynecology

## 2016-02-21 NOTE — Telephone Encounter (Signed)
Spoke with pt regarding benefit for ultrasound. Patient understood and agreeable. Patient ready to schedule. Patient scheduled 03/01/16 with Dr Miller. Pt aware of arrival date and time. Pt aware of 72 hours cancellation policy with $100 fee. No further questions. Ok to close °

## 2016-02-23 NOTE — Progress Notes (Signed)
Encounter reviewed [name], MD   

## 2016-02-27 ENCOUNTER — Ambulatory Visit: Payer: BLUE CROSS/BLUE SHIELD | Attending: Neurology | Admitting: Physical Therapy

## 2016-02-27 DIAGNOSIS — R252 Cramp and spasm: Secondary | ICD-10-CM | POA: Diagnosis present

## 2016-02-27 DIAGNOSIS — R293 Abnormal posture: Secondary | ICD-10-CM

## 2016-02-27 DIAGNOSIS — M542 Cervicalgia: Secondary | ICD-10-CM

## 2016-02-27 NOTE — Therapy (Signed)
[place], [place], [address] Phone: [phone]   Fax:  [phone]  Physical Therapy Treatment / Discharge Note  Patient Details  Name: Kayce Chismar Ferre MRN: [id number] Date of Birth: [birth year] No Data Recorded  Encounter Date: 02/27/2016      PT End of Session - 02/27/16 1602    Visit Number 10   Number of Visits 12   Date for PT Re-Evaluation 02/27/16   Authorization Type BCBS progress   PT Start Time 1546   PT Stop Time 1625   PT Time Calculation (min) 39 min   Activity Tolerance Patient tolerated treatment well   Behavior During Therapy [facility] for tasks assessed/performed      Past Medical History  Diagnosis Date  . Epilepsy ([place])     last seizure - years ago  . History of migraine headaches   . GERD (gastroesophageal reflux disease)   . Carpal tunnel syndrome of right wrist 05/2015  . Endometriosis     Past Surgical History  Procedure Laterality Date  . Elbow surgery Left 2012  . Umbilical hernia repair      as an infant  . Colonoscopy with propofol N/A 02/10/2013    Procedure: COLONOSCOPY WITH PROPOFOL;  Surgeon: Garlan Fair, MD;  Location: [facility];  Service: Endoscopy;  Laterality: N/A;  . Diagnostic laparoscopy  12/15/2009    uterosacral nerve ablation  . Yag laser application  1/83/4373    vaporization of endometriosis  . Carpal tunnel release Right 06/14/2015    Procedure: RIGHT CARPAL TUNNEL RELEASE ENDOSCOPIC;  Surgeon: Milly Jakob, MD;  Location: [place];  Service: Orthopedics;  Laterality: Right;    There were no vitals filed for this visit.      Subjective Assessment - 02/27/16 1549    Subjective "things have been pretty good, I've had some stress but overall I have been doing very well"    Currently in Pain? Yes   Pain Score 2    Pain Location Neck   Pain Orientation Left   Pain Descriptors / Indicators Tightness   Pain Type Chronic pain   Pain Onset More than a month ago   Pain Frequency Intermittent   Aggravating Factors  stress   Pain Relieving Factors exercise, heat            [facility] PT Assessment - 02/27/16 1613    Observation/Other Assessments   Focus on Therapeutic Outcomes (FOTO)  27% limited   AROM   Cervical Flexion 62   Cervical Extension 82   Cervical - Right Side Bend 60   Cervical - Left Side Bend 60   Cervical - Right Rotation 60  tightness without pain   Cervical - Left Rotation 68   Strength   Right Shoulder Flexion 4+/5   Right Shoulder ABduction 4+/5   Left Shoulder Flexion 4+/5   Left Shoulder ABduction 4+/5   Right Hand Grip (lbs) 54.6  54,55,55   Left Hand Grip (lbs) 45  45,47,43                     [facility] Adult PT Treatment/Exercise - 02/27/16 1555    Neck Exercises: Machines for Strengthening   UBE (Upper Arm Bike) L1.5 x 10  changing direction at 5 min   Neck Exercises: Stretches   Upper Trapezius Stretch 2 reps;30 seconds   Levator Stretch 2 reps;30 seconds  PT Education - 02/27/16 1731    Education provided Yes   Education Details Reviewed HEP   Samad(s) Educated Patient   Methods Explanation   Comprehension Verbalized understanding          PT Short Term Goals - 01/23/16 1606    PT SHORT TERM GOAL #1   Title Pt will be able to demo corrected posture and report better awareness throughout the day   Time 2   Period Weeks   Status Achieved   PT SHORT TERM GOAL #2   Title Pt with report pain in neck less intense, frequent, improved 25% or more   Time 2   Period Weeks   Status Achieved           PT Long Term Goals - 02/27/16 1620    PT LONG TERM GOAL #1   Title Pt will be able to demo normal AROM in Cervical spine, pain free.    Time 6   Period Weeks   Status Achieved   PT LONG TERM GOAL #2   Title Pt will be able to demo and understand concepts of posture, body mechanics and lifting (as it relates to gym exercises)    Time 6   Period Weeks   Status Achieved   PT LONG TERM GOAL #3   Title Pt will be able to complete workouts without increase in pain    Period Weeks   Status Achieved   PT LONG TERM GOAL #4   Title FOTO score will decrease to less than 35% limited    Baseline 27% limited   Time 6   Period Weeks   Status Achieved   PT LONG TERM GOAL #5   Title Pt will be able to report no more than 2/10 with daily activites, reading and work activities.    Time 6   Period Weeks   Status Achieved               Plan - 02/27/16 1732    Clinical Impression Statement Mrs. Delmont reports she has been doing well the last couple of weeks with minimal report of pain just mostly tightness. she states she has been going ot the gym and has been consistent with her exercises. She has improved her cervical AROM especially with R sidebending/ L sidebending and with report of only tightness with R rotation. She met all goals this visit and reports that she is able to maintain and progress her current level of function independently and will be discharged from physical therapy.    PT Next Visit Plan Discharged from PT   PT Home Exercise Plan HEP review   Consulted and Agree with Plan of Care Patient      Patient will benefit from skilled therapeutic intervention in order to improve the following deficits and impairments:  Decreased range of motion, Increased fascial restricitons, Impaired UE functional use, Pain, Impaired flexibility, Improper body mechanics, Decreased mobility, Decreased strength, Postural dysfunction  Visit Diagnosis: Cervicalgia  Abnormal posture  Cramp and spasm     Problem List There are no active problems to display for this patient.  Starr Lake PT, DPT, LAT, ATC  02/27/2016  5:37 PM      [facility] [address] [place], [place], [address] Phone: [phone]   Fax:  [phone]  Name: Carizma Dunsworth Mrozek MRN:  [id number] Date of Birth: 10-20-1976     PHYSICAL THERAPY DISCHARGE SUMMARY  Visits from Start of Care: 10  Current functional level related to goals / functional outcomes: FOTO 27% limited   Remaining deficits: Intermittent mild pain rated at a 2/10 that is controlled with stretching and exercise.    Education / Equipment: HEP, theraband for strengthening, posture education  Plan: Patient agrees to discharge.  Patient goals were met. Patient is being discharged due to meeting the stated rehab goals.  ?????

## 2016-03-01 ENCOUNTER — Ambulatory Visit (INDEPENDENT_AMBULATORY_CARE_PROVIDER_SITE_OTHER): Payer: BLUE CROSS/BLUE SHIELD | Admitting: Obstetrics & Gynecology

## 2016-03-01 ENCOUNTER — Other Ambulatory Visit: Payer: BLUE CROSS/BLUE SHIELD

## 2016-03-01 DIAGNOSIS — N938 Other specified abnormal uterine and vaginal bleeding: Secondary | ICD-10-CM

## 2016-03-01 DIAGNOSIS — N852 Hypertrophy of uterus: Secondary | ICD-10-CM

## 2016-03-05 ENCOUNTER — Encounter: Payer: BLUE CROSS/BLUE SHIELD | Admitting: Physical Therapy

## 2016-03-08 ENCOUNTER — Ambulatory Visit (INDEPENDENT_AMBULATORY_CARE_PROVIDER_SITE_OTHER): Payer: BLUE CROSS/BLUE SHIELD

## 2016-03-08 ENCOUNTER — Ambulatory Visit (INDEPENDENT_AMBULATORY_CARE_PROVIDER_SITE_OTHER): Payer: BLUE CROSS/BLUE SHIELD | Admitting: Obstetrics & Gynecology

## 2016-03-08 VITALS — BP 120/80 | HR 78 | Resp 18 | Wt 190.0 lb

## 2016-03-08 DIAGNOSIS — N938 Other specified abnormal uterine and vaginal bleeding: Secondary | ICD-10-CM

## 2016-03-08 DIAGNOSIS — N838 Other noninflammatory disorders of ovary, fallopian tube and broad ligament: Secondary | ICD-10-CM | POA: Diagnosis not present

## 2016-03-08 DIAGNOSIS — N852 Hypertrophy of uterus: Secondary | ICD-10-CM

## 2016-03-08 DIAGNOSIS — R102 Pelvic and perineal pain: Secondary | ICD-10-CM

## 2016-03-13 ENCOUNTER — Encounter: Payer: Self-pay | Admitting: Obstetrics & Gynecology

## 2016-03-13 NOTE — Progress Notes (Signed)
40 y.o. 760P0000 Married Caucasian female here for pelvic ultrasound due to change in menstrual cycles with weight loss of the last two years (~35#) as well as pelvic pain that has been persistent for years.  Pt has hx of laparoscopy x 2.  The first showed significant endometriosis, however, pt reports she second one was negative.  This was done by Dr. Sydnee Cabalelcambre four or five years ago.  Pt has been on OCPs and is not sure if pain is better with OCPs.  She had worsening migraines with this ([place]) so that is why she stopped the OCPs.  Pt and spouse report they do not want to have children.  He's undergone a vasectomy.  Pt somewhat tearful today about the pain.  Would really like to get to the bottom of the pain.  Patient's last menstrual period was 03/01/2016.  Contraception: vasectomy  Findings:  UTERUS: 6.6 x 4.6 x 3.6cm EMS: 6.436mm ADNEXA: Left ovary: 4.0 x 2.9 x 2.0cm  (ovarian volume 12.8cm3).  Multiple peripheral follicles.       Right ovary: 3.1 x 2.3 x 1.9cm (ovarian volume 6. 4cm3) CUL DE SAC: no free fluid  Discussion:  Findings reviewed with pt and spouse.  Possible PCOS findings reviewed.  Pt has never had an evaluation for this.  Lab work discussed.  Treatment options discussed.  DUB discussed.  OCPs, POPs, progesterone methods discussed.  Pt had IUD in the past but this was removed due to worsening pain.  Also, pain discussed.  Repeat laparoscopy for diagnosis and treatment, Depo Lupron discussed for both diagnostic and therapeutic purposes including risks and benefits.  Hysterectomy discussed.  As spouse has already undergone vasectomy, they have clearly decided that they do not want children.  She and spouse explained their decision making process (which was very reasonable).  I do not have operative not or pictures from past laparoscopies.  She thinks she may have some at home.  She will look.  She is really not sure what she wants to do but is appreciative of the explanations I gave.   She and spouse with consider.  Information provided.  Assessment:  DUB Intentional weight loss Possible PCOS Pelvic pain H/O endometriosis with first laparoscopy  Plan:  Pt and spouse are going to process information with discussed today and call me if has additional questions/concerns especially if they would like to proceed with PCOS testing [facility].  Pt will look for her pictures as well.  ~50 minutes spent with patient and spouse with 100% of time was in face to face discussion of above.

## 2016-03-14 ENCOUNTER — Telehealth: Payer: Self-pay

## 2016-03-14 NOTE — Telephone Encounter (Signed)
Telephone encounter created to discuss mychart message with patient. 

## 2016-03-14 NOTE — Telephone Encounter (Signed)
Routing to provider for final review. Patient agreeable to disposition. Will close encounter.     

## 2016-03-14 NOTE — Telephone Encounter (Signed)
Visit Follow-Up Question  Message [phone]   From  Charm BargesSusan L Marton   To  Jerene BearsMary S Miller, MD   Sent  03/13/2016 10:43 AM     Dear Dr. Hyacinth MeekerMiller,  Thank you very much for taking the time to talk so thoroughly with me and my husband regarding my condition and options.  You mentioned having my testosterone level checked. I would like to do that.  Also, I have several follow up questions. Would it be best to set up another visit or discuss over the phone?   Thank you again,  Darl PikesSusan Dessert      Responsible Party    Pool - [facility] Clinical Pool No one has taken responsibility for this message.     No actions have been taken on this message.     Left message to call Giada Schoppe at [phone].

## 2016-03-14 NOTE — Telephone Encounter (Signed)
Patient called to speak with Yvonna AlanisKaitlyn she said that she needed to schedule follow up appointment with Dr. Hyacinth MeekerMiller. Scheduled patient for 03/15/16 at 1:45.

## 2016-03-15 ENCOUNTER — Ambulatory Visit (INDEPENDENT_AMBULATORY_CARE_PROVIDER_SITE_OTHER): Payer: BLUE CROSS/BLUE SHIELD | Admitting: Obstetrics & Gynecology

## 2016-03-15 ENCOUNTER — Encounter: Payer: Self-pay | Admitting: Obstetrics & Gynecology

## 2016-03-15 VITALS — BP 116/60 | HR 88 | Resp 16 | Ht 66.5 in | Wt 189.0 lb

## 2016-03-15 DIAGNOSIS — R102 Pelvic and perineal pain: Secondary | ICD-10-CM | POA: Diagnosis not present

## 2016-03-15 DIAGNOSIS — N946 Dysmenorrhea, unspecified: Secondary | ICD-10-CM | POA: Diagnosis not present

## 2016-03-15 DIAGNOSIS — E282 Polycystic ovarian syndrome: Secondary | ICD-10-CM | POA: Diagnosis not present

## 2016-03-15 NOTE — Progress Notes (Signed)
40 y.o. G0P0000 Married CaucasianF here for follow up after PUS obtained 03/08/16.  Pt with hx of endometriosis as well as pelvic pain and dysmenorrhea.  She, her husband, and I discussed options for treatment after PUS (which was normal except for the appearance of one PCOS looking ovary).  Pt has never had a testosterone level obtained and we discussed this at the last visit.  She felt like she had information overload last visit and was not sure about having this done.  Today, after some reflection and research on her own, she wants to have the blood work done.  D/w pt and spouse additional testing if this is elevated.  Will need to see total testosterone level first.  They are comfortable with this plan.  She brings last laparoscopy pictures for me to see.  This does look normal.  Her uterosacral ligaments were cauterized with this laparoscopy as noted in her pictures and she does have some adhesions to the left ovary.    Pt is wondering about PCOS and pain connection/relation.  Advised pt, the PCOS ovary finding was just and incidental finding noted with the ultrasound and I am doubtful there is any relation to her pain issues.    She is not sure at this time what she wants to do--Depo Lupron for one month, laparoscopy vs hysterectomy.  They are not interested in child bearing as her husband has had a vasectomy.  Briefly, surgeries including risks and recovery discussed.  Pt has some specific questions which were answered.  Of course, I cannot guarantee with any of these that pain will resolve completely.  However, she has an exacerbation of her pain with each cycle that really is limiting for her for at least 3 days each month.  She often needs to be out of work for these days.  This will likely be gone after the surgery as she will not bleed.    Patient's last menstrual period was 02/29/2016.          Sexually active: Yes.    The current method of family planning is vasectomy.    Smoker:   no  Health Maintenance: Pap:  9/15, negative   reports that she has never smoked. She has never used smokeless tobacco. She reports that she drinks about 1.2 oz of alcohol per week. She reports that she does not use illicit drugs.  Past Medical History  Diagnosis Date  . Epilepsy (HCC)     last seizure - years ago  . History of migraine headaches   . GERD (gastroesophageal reflux disease)   . Carpal tunnel syndrome of right wrist 05/2015  . Endometriosis     Past Surgical History  Procedure Laterality Date  . Elbow surgery Left 2012  . Umbilical hernia repair      as an infant  . Colonoscopy with propofol N/A 02/10/2013    Procedure: COLONOSCOPY WITH PROPOFOL;  Surgeon: Charolett BumpersMartin K Johnson, MD;  Location: [facility];  Service: Endoscopy;  Laterality: N/A;  . Diagnostic laparoscopy  12/15/2009    uterosacral nerve ablation  . Yag laser application  06/24/2006    vaporization of endometriosis  . Carpal tunnel release Right 06/14/2015    Procedure: RIGHT CARPAL TUNNEL RELEASE ENDOSCOPIC;  Surgeon: Mack Hookavid Thompson, MD;  Location: [facility];  Service: Orthopedics;  Laterality: Right;    Current Outpatient Prescriptions  Medication Sig Dispense Refill  . amitriptyline (ELAVIL) 50 MG tablet Take 50 mg by mouth. Takes 2 at bedtime    .  CAMBIA 50 MG PACK as needed.  1  . cholecalciferol (VITAMIN D) 1000 UNITS tablet Take 1,000 Units by mouth daily.    . clonazePAM (KLONOPIN) 0.5 MG tablet as needed.    Marland Kitchen FIBER PO Take by mouth daily.    Marland Kitchen ibuprofen (ADVIL,MOTRIN) 200 MG tablet Take 200 mg by mouth every 6 (six) hours as needed for pain.    Marland Kitchen lamoTRIgine (LAMICTAL) 100 MG tablet Take 200 mg by mouth 2 (two) times daily.     Marland Kitchen levETIRAcetam (KEPPRA) 1000 MG tablet TAKE ONE-HALF TABLET BY MOUTH EVERY MORNING & ONE TABLET BY MOUTH AT BEDTIME  0  . loratadine (CLARITIN) 10 MG tablet Take 10 mg by mouth as needed. Reported on 12/13/2015    . Multiple Vitamins-Minerals  (MULTIVITAMIN PO) Take by mouth daily.    . ranitidine (ZANTAC) 150 MG tablet Take 150 mg by mouth as needed.      No current facility-administered medications for this visit.    Family History  Problem Relation Age of Onset  . Hypertension Mother   . Diabetes Mother   . Hypertension Maternal Grandmother   . Diabetes Maternal Grandmother   . Other Father     bile duct cancer  . Cancer Father     ROS:  Pertinent items are noted in HPI.  Otherwise, a comprehensive ROS was negative.  Exam:   BP 116/60 mmHg  Pulse 88  Resp 16  Ht 5' 6.5" (1.689 m)  Wt 189 lb (85.73 kg)  BMI 30.05 kg/m2  LMP 02/29/2016   Height: 5' 6.5" (168.9 cm)  Ht Readings from Last 3 Encounters:  03/15/16 5' 6.5" (1.689 m)  02/17/16 5' 5.5" (1.664 m)  07/07/15 5' 5.5" (1.664 m)   Physical Exam  Constitutional: She is oriented to [place], place, and time. She appears well-developed and well-nourished.  Neurological: She is alert and oriented to [place], place, and time.  Psychiatric: She has a normal mood and affect.    A:  Pelvic pain in female Dysmenorrhea H/o endometriosis Possible left ovarian adhesion noted with last laparoscopy pictures Appearance of PCOS on one ovary on recent PUS Epilepsy hx H.O migraines  P:   Depo Lupron, laparoscopy, hysterectomy all reviewed with pt.  She is still considering options. Total testosterone today.  If elevated, pt will need to proceed with additional testing.  ~30 minutes spent with patient >50% of time was in face to face discussion of above.

## 2016-03-15 NOTE — Progress Notes (Signed)
appt needed to be canceled due to water issues in office

## 2016-03-17 LAB — TESTOSTERONE, TOTAL, LC/MS/MS: Testosterone, Total, LC-MS-MS: 15 ng/dL (ref 2–45)

## 2016-03-19 DIAGNOSIS — N946 Dysmenorrhea, unspecified: Secondary | ICD-10-CM | POA: Insufficient documentation

## 2016-03-19 DIAGNOSIS — R102 Pelvic and perineal pain: Secondary | ICD-10-CM | POA: Insufficient documentation

## 2016-03-19 DIAGNOSIS — E282 Polycystic ovarian syndrome: Secondary | ICD-10-CM | POA: Insufficient documentation

## 2016-03-20 ENCOUNTER — Encounter: Payer: Self-pay | Admitting: Obstetrics & Gynecology

## 2016-03-20 ENCOUNTER — Telehealth: Payer: Self-pay | Admitting: Emergency Medicine

## 2016-03-20 NOTE — Telephone Encounter (Signed)
Chief Complaint  Patient presents with  . Advice Only    Patient sent mychart message with request for medical advice    ===View-only below this line===   ----- Message -----    From: [name],[name]    Sent: 03/20/2016  9:19 AM EDT      To: [name], [name], MD Subject: Visit Follow-Up Question  Dear Dr. Hyacinth MeekerMiller,  Thank you for providing my test results. I would like to move forward with the hysterectomy. If possible, I would prefer to schedule it in mid to late July or August. I do have a couple follow up questions: 1) How long will someone need to stay with me after the surgery? 2) Would it be ok to travel (by plane or car) during recovery if I am not lifting luggage or anything else heavy?  Thank you again. [name]    Dr. Hyacinth MeekerMiller sent lab results to patient:   Entered by Jerene BearsMary S Miller, MD at 03/19/2016 5:21 PM    Read by [name] at 03/20/2016 9:20 AM   April Campbell,  Your testosterone level was 15. At this point, I would not recommend any additional blood work. It is possible to have an ovary look polycystic in nature but blood work to be normal. I would check a blood glucose on you and a cholestrol panel yearly as women with PCOS are at a higher risk for diabetes and for cholesterol issues. For now, I do not think you need anything else for evaluation of the PCOS finding on ultrasound.   Please let me know if you have additional questions and when you have made a decision about treatment options. Thanks.   Dr. Hyacinth MeekerMiller

## 2016-03-20 NOTE — Telephone Encounter (Signed)
Routing patient mychart message to Dr. Hyacinth MeekerMiller to review and advice and to Billie RuddySally Yeakley, RN for potential surgical scheduling.

## 2016-03-20 NOTE — Telephone Encounter (Signed)
Responded to patient via mychart and telephone encounter created.

## 2016-03-21 NOTE — Telephone Encounter (Signed)
Answers to her questions:  She will likely spent the night so she will be walking, voiding, eating, and having good pain control before she goes home.  She will likely not be able to drive for a week.  She doesn't "need" anyone with her but she does need someone that first week to be able to help her if she has any immediate issues.  As long as she can get someone to help her fairly quickly, she does not need anyone with her all the time.  If she has someone who "can" be with her, a week is a good time frame.  By then, she will most likely be driving.  Travel is ok but car or airplane travel does increase the risk of a DVT the close one is to surgery.  If the travel is a few hours away, she needs to get out of the car and walk around for a few minutes every two hours.  I would plan the airplane travel for closer to the four week mark, if possible, just to make sure everything is going ok and she isn't have any issues.

## 2016-03-21 NOTE — Telephone Encounter (Signed)
Return call to patient and she is given message from Dr. Hyacinth MeekerMiller.  Verbalizes understanding. She would like to move forward with surgical planning.  Patient wishes to schedule in July or August, both after the 15th if possible. Preferred days 7/17 or 7/24. Advised will return call with next steps and patient agreeable.  Reviewed with Billie RuddySally Yeakley, RN and message to her for follow up.

## 2016-03-22 NOTE — Telephone Encounter (Signed)
Return call to patient. Discussed date options. Advised will need to review with provider and call back to confirm date. Surgery instructions sheet reviewed and printed copy will be mailed to patient. See copy scanned to chart. Patient will have FMLA forms sent to office for completion.

## 2016-03-23 NOTE — Telephone Encounter (Signed)
Returned surgery date of 05-14-16. Patient scheduled for surgery consult on 05-03-16.  Routing to provider for final review. Patient agreeable to disposition. Will close encounter.

## 2016-03-23 NOTE — Telephone Encounter (Signed)
Surgery scheduled for 05-14-16 at 0730 at [facility].  Call to patient. Advised of surgery date.  Patient now states she cannot doe 05-14-16 as she originally thought. She has to be somewhere on 05-16-16. Discussed available dates in August. Patient will recheck options and call back to se if she can make 05-14-16 work out.

## 2016-03-23 NOTE — Telephone Encounter (Addendum)
Patient returned Sally's call. She confirmed the 05/14/16 date will work for her. She'd like a call back at: [phone]

## 2016-03-24 ENCOUNTER — Other Ambulatory Visit: Payer: Self-pay | Admitting: Obstetrics & Gynecology

## 2016-04-23 ENCOUNTER — Encounter: Payer: Self-pay | Admitting: Obstetrics & Gynecology

## 2016-04-23 ENCOUNTER — Ambulatory Visit (INDEPENDENT_AMBULATORY_CARE_PROVIDER_SITE_OTHER): Payer: BLUE CROSS/BLUE SHIELD | Admitting: Obstetrics & Gynecology

## 2016-04-23 VITALS — BP 102/56 | HR 84 | Resp 16 | Ht 66.0 in | Wt 188.0 lb

## 2016-04-23 DIAGNOSIS — N809 Endometriosis, unspecified: Secondary | ICD-10-CM | POA: Diagnosis not present

## 2016-04-23 DIAGNOSIS — N946 Dysmenorrhea, unspecified: Secondary | ICD-10-CM

## 2016-04-23 DIAGNOSIS — Z124 Encounter for screening for malignant neoplasm of cervix: Secondary | ICD-10-CM

## 2016-04-23 DIAGNOSIS — R102 Pelvic and perineal pain: Secondary | ICD-10-CM

## 2016-04-23 MED ORDER — OXYCODONE-ACETAMINOPHEN 5-325 MG PO TABS: 2.0000 | ORAL_TABLET | ORAL | Status: DC | PRN

## 2016-04-23 MED ORDER — IBUPROFEN 800 MG PO TABS: 800.0000 mg | ORAL_TABLET | Freq: Three times a day (TID) | ORAL | Status: DC | PRN

## 2016-04-23 NOTE — Progress Notes (Signed)
40 y.o. G0P0000 MarriedCaucasian with history of chronic pelvic pain and endometriosis here (with her spouse) to discuss treatment plans.  After last visit, she called back and states she wanted to proceed with definitive surgery.  She has been scheduled but details of surgery have not been reviewed.  She is here for this today and to be sure about her decision.    Procedure discussed with patient.  Hospital stay, recovery and pain management all discussed.  Risks discussed including but not limited to bleeding, 1% risk of receiving a  transfusion, infection, 3-4% risk of bowel/bladder/ureteral/vascular injury discussed as well as possible need for additional surgery if injury does occur discussed.  DVT/PE and rare risk of death discussed.  My actual complications with prior surgeries discussed.  Vaginal cuff dehiscence discussed.  Hernia formation discussed.  Positioning and incision locations discussed.  Patient aware if pathology abnormal she may need additional treatment.  All questions answered.    She is aware that if endometriosis is present, this will be treated if possible.  She is also aware that this may not completely resolve her pain.  She is ok with that outcome.    Ob Hx:   Patient's last menstrual period was 03/29/2016.          Sexually active: Yes.   Birth control: no method Last pap: 07/02/2014 negative  Last MMG: never Tobacco: never smoker  Past Surgical History  Procedure Laterality Date  . Elbow surgery Left 2012  . Umbilical hernia repair      as an infant  . Colonoscopy with propofol N/A 02/10/2013    Procedure: COLONOSCOPY WITH PROPOFOL;  Surgeon: Charolett BumpersMartin K Johnson, MD;  Location: [facility];  Service: Endoscopy;  Laterality: N/A;  . Diagnostic laparoscopy  12/15/2009    uterosacral nerve ablation  . Yag laser application  06/24/2006    vaporization of endometriosis  . Carpal tunnel release Right 06/14/2015    Procedure: RIGHT CARPAL TUNNEL RELEASE ENDOSCOPIC;  Surgeon:  Mack Hookavid Thompson, MD;  Location: [facility];  Service: Orthopedics;  Laterality: Right;    Past Medical History  Diagnosis Date  . Epilepsy (HCC)     last seizure - years ago  . History of migraine headaches   . GERD (gastroesophageal reflux disease)   . Carpal tunnel syndrome of right wrist 05/2015  . Endometriosis     Allergies: Wellbutrin; Escitalopram oxalate; and Topiramate  Current Outpatient Prescriptions  Medication Sig Dispense Refill  . amitriptyline (ELAVIL) 50 MG tablet Take 100 mg by mouth. Takes 2 at bedtime    . CAMBIA 50 MG PACK Take 1 each by mouth as needed (headache).   1  . cholecalciferol (VITAMIN D) 1000 UNITS tablet Take 1,000 Units by mouth daily.    . clonazePAM (KLONOPIN) 0.5 MG tablet Take 0.5 mg by mouth as needed for anxiety.     Marland Kitchen. FIBER PO Take 1 packet by mouth daily.     Marland Kitchen. ibuprofen (ADVIL,MOTRIN) 200 MG tablet Take 200 mg by mouth every 6 (six) hours as needed for pain.    Marland Kitchen. lamoTRIgine (LAMICTAL) 100 MG tablet Take 200 mg by mouth 2 (two) times daily.     Marland Kitchen. levETIRAcetam (KEPPRA) 1000 MG tablet TAKE ONE-HALF TABLET BY MOUTH EVERY MORNING & ONE TABLET BY MOUTH AT BEDTIME  0  . loratadine (CLARITIN) 10 MG tablet Take 10 mg by mouth as needed for allergies. Reported on 12/13/2015    . Multiple Vitamins-Minerals (MULTIVITAMIN PO) Take 1 tablet  by mouth daily.     . ranitidine (ZANTAC) 150 MG tablet Take 150 mg by mouth as needed for heartburn.      No current facility-administered medications for this visit.    ROS: Pertinent items are noted in HPI.  Exam:    BP 102/56 mmHg  Pulse 84  Resp 16  Ht 5\' 6"  (1.676 m)  Wt 188 lb (85.276 kg)  BMI 30.36 kg/m2  LMP 03/29/2016  General appearance: alert and cooperative Head: Normocephalic, without obvious abnormality, atraumatic Neck: no adenopathy, supple, symmetrical, trachea midline and thyroid not enlarged, symmetric, no tenderness/mass/nodules Lungs: clear to auscultation  bilaterally Heart: regular rate and rhythm, S1, S2 normal, no murmur, click, rub or gallop Abdomen: soft, non-tender; bowel sounds normal; no masses,  no organomegaly Extremities: extremities normal, atraumatic, no cyanosis or edema Skin: Skin color, texture, turgor normal. No rashes or lesions Lymph nodes: Cervical, supraclavicular, and axillary nodes normal. no inguinal nodes palpated Neurologic: Grossly normal  Pelvic: External genitalia:  no lesions              Urethra: normal appearing urethra with no masses, tenderness or lesions              Bartholins and Skenes: normal                 Vagina: normal appearing vagina with normal color and discharge, no lesions              Cervix: normal appearance              Pap taken: Yes.          Bimanual Exam:  Uterus:  uterus is normal size, shape, consistency and nontender                                      Adnexa:    normal adnexa in size, nontender and no masses                                      Rectovaginal: Deferred                                      Anus:  normal sphincter tone, no lesions  A: Chronic pelvic pain H/O endometriosis on laparoscopy H/O umbilical hernia repair as a child PCOS appearing ovary Dysmenorrhea Epilepsy H/O Migraine without aura  P:  TLH/bilateral salpingectomy/possible BSO/cystoscopy and possible treatmetn of endometriosis planned Rx for Motrin and Percocet given. Pap obtained today. Medications/Vitamins reviewed.  Pt knows needs to stop any ASA products. Hysterectomy brochure given for pre and post op instructions.  ~30 minutes spent with patient >50% of time was in face to face discussion of above.

## 2016-04-25 ENCOUNTER — Other Ambulatory Visit: Payer: Self-pay | Admitting: Obstetrics & Gynecology

## 2016-04-25 DIAGNOSIS — N809 Endometriosis, unspecified: Secondary | ICD-10-CM | POA: Insufficient documentation

## 2016-04-26 ENCOUNTER — Telehealth: Payer: Self-pay | Admitting: *Deleted

## 2016-04-26 DIAGNOSIS — R1900 Intra-abdominal and pelvic swelling, mass and lump, unspecified site: Secondary | ICD-10-CM

## 2016-04-26 DIAGNOSIS — R102 Pelvic and perineal pain: Secondary | ICD-10-CM

## 2016-04-26 DIAGNOSIS — N809 Endometriosis, unspecified: Secondary | ICD-10-CM

## 2016-04-26 DIAGNOSIS — E282 Polycystic ovarian syndrome: Secondary | ICD-10-CM

## 2016-04-26 LAB — IPS PAP TEST WITH REFLEX TO HPV

## 2016-04-26 NOTE — Telephone Encounter (Signed)
Patient is calling in regards to a CT scan. She seen Dr.Miller on Monday and the CT scan was ordered. She is waiting to hear back on when she is suppose to go

## 2016-04-27 ENCOUNTER — Other Ambulatory Visit: Payer: Self-pay | Admitting: *Deleted

## 2016-04-27 DIAGNOSIS — R1900 Intra-abdominal and pelvic swelling, mass and lump, unspecified site: Secondary | ICD-10-CM

## 2016-04-27 NOTE — Patient Instructions (Addendum)
Your procedure is scheduled on:  Monday, May 14, 2016  Enter through the Main Entrance of [facility] at:  6:00 AM  Pick up the phone at the desk and dial [phone].  Call this number if you have problems the morning of surgery: [phone].  Remember: Do NOT eat food or drink after:  Midnight Sunday, May 13, 2016  Take these medicines the morning of surgery with a SIP OF WATER:  Lamictal, Keppra, Zantac  Do NOT wear jewelry (body piercing), metal hair clips/bobby pins, make-up, or nail polish. Do NOT wear lotions, powders, or perfumes.  You may wear deodorant. Do NOT shave for 48 hours prior to surgery. Do NOT bring valuables to the hospital.  Leave suitcase in car.  After surgery it may be brought to your room.  For patients admitted to the hospital, checkout time is 11:00 AM the day of discharge. Home with husband Milinda HirschfeldGriff [phone].

## 2016-04-27 NOTE — Telephone Encounter (Signed)
inbox to sally

## 2016-04-27 NOTE — Telephone Encounter (Signed)
Opened in error

## 2016-04-27 NOTE — Telephone Encounter (Signed)
Call to patient and she is advised that we have ordered CT and are setting up appointment. Advised will return call with appointment and instructions. Patient agreeable.   1308: Return call to patient. Detailed message left per designated party release form.  Patient is scheduled for CT/ABD Pelvis with contrast [facility] at [address], [place] [id number] on 05/03/16 at 1440 arrival for 1500 scan. Will need to go to their office to pick written instructions or can come here for oral contrast.  Will need to drink Oral contrast at 1:00 and 2:00 and no solid foods 4 hours prior to appointment (may have fluids prior to appointment)  Gave phone number to [facility] for patient to contact with any questions or if needs to change appointment.  Advised can speak with any nurse at return call if any questions.

## 2016-04-30 ENCOUNTER — Encounter (HOSPITAL_COMMUNITY): Payer: Self-pay

## 2016-04-30 ENCOUNTER — Encounter (HOSPITAL_COMMUNITY)
Admission: RE | Admit: 2016-04-30 | Discharge: 2016-04-30 | Disposition: A | Discharge status: Home / Self Care | Payer: BLUE CROSS/BLUE SHIELD | Source: Ambulatory Visit | Attending: Obstetrics & Gynecology | Admitting: Obstetrics & Gynecology

## 2016-04-30 DIAGNOSIS — Z01812 Encounter for preprocedural laboratory examination: Secondary | ICD-10-CM | POA: Diagnosis not present

## 2016-04-30 HISTORY — DX: Unspecified convulsions: R56.9

## 2016-04-30 LAB — CBC
HCT: 34.6 % — ABNORMAL LOW (ref 36.0–46.0)
Hemoglobin: 11.4 g/dL — ABNORMAL LOW (ref 12.0–15.0)
MCH: 30.3 pg (ref 26.0–34.0)
MCHC: 32.9 g/dL (ref 30.0–36.0)
MCV: 92 fL (ref 78.0–100.0)
PLATELETS: 306 10*3/uL (ref 150–400)
RBC: 3.76 MIL/uL — ABNORMAL LOW (ref 3.87–5.11)
RDW: 13.6 % (ref 11.5–15.5)
WBC: 6.2 10*3/uL (ref 4.0–10.5)

## 2016-05-03 ENCOUNTER — Ambulatory Visit
Admission: RE | Admit: 2016-05-03 | Discharge: 2016-05-03 | Disposition: A | Discharge status: Home / Self Care | Payer: BLUE CROSS/BLUE SHIELD | Source: Ambulatory Visit | Attending: Obstetrics & Gynecology | Admitting: Obstetrics & Gynecology

## 2016-05-03 ENCOUNTER — Ambulatory Visit: Payer: BLUE CROSS/BLUE SHIELD | Admitting: Obstetrics & Gynecology

## 2016-05-03 DIAGNOSIS — R1900 Intra-abdominal and pelvic swelling, mass and lump, unspecified site: Secondary | ICD-10-CM

## 2016-05-03 DIAGNOSIS — R102 Pelvic and perineal pain: Secondary | ICD-10-CM | POA: Diagnosis not present

## 2016-05-03 DIAGNOSIS — E282 Polycystic ovarian syndrome: Secondary | ICD-10-CM

## 2016-05-03 DIAGNOSIS — N809 Endometriosis, unspecified: Secondary | ICD-10-CM

## 2016-05-03 MED ORDER — IOPAMIDOL (ISOVUE-300) INJECTION 61%
100.0000 mL | Freq: Once | INTRAVENOUS | Status: AC | PRN
  Administered 2016-05-03: 100 mL via INTRAVENOUS

## 2016-05-13 MED ORDER — DEXTROSE 5 % IV SOLN
2.0000 g | INTRAVENOUS | Status: DC
  Filled 2016-05-13: qty 2

## 2016-05-14 ENCOUNTER — Encounter (HOSPITAL_COMMUNITY): Payer: Self-pay | Admitting: Anesthesiology

## 2016-05-14 ENCOUNTER — Encounter (HOSPITAL_COMMUNITY)
Admission: AD | Disposition: A | Discharge status: Home / Self Care | Payer: Self-pay | Source: Ambulatory Visit | Attending: Obstetrics & Gynecology

## 2016-05-14 ENCOUNTER — Ambulatory Visit (HOSPITAL_COMMUNITY): Payer: BLUE CROSS/BLUE SHIELD | Admitting: Anesthesiology

## 2016-05-14 ENCOUNTER — Ambulatory Visit (HOSPITAL_COMMUNITY)
Admission: AD | Admit: 2016-05-14 | Discharge: 2016-05-15 | Disposition: A | Discharge status: Home / Self Care | Payer: BLUE CROSS/BLUE SHIELD | Source: Ambulatory Visit | Attending: Obstetrics & Gynecology | Admitting: Obstetrics & Gynecology

## 2016-05-14 DIAGNOSIS — N838 Other noninflammatory disorders of ovary, fallopian tube and broad ligament: Secondary | ICD-10-CM | POA: Diagnosis not present

## 2016-05-14 DIAGNOSIS — N946 Dysmenorrhea, unspecified: Secondary | ICD-10-CM | POA: Diagnosis not present

## 2016-05-14 DIAGNOSIS — Z6832 Body mass index (BMI) 32.0-32.9, adult: Secondary | ICD-10-CM | POA: Diagnosis not present

## 2016-05-14 DIAGNOSIS — N809 Endometriosis, unspecified: Secondary | ICD-10-CM | POA: Diagnosis present

## 2016-05-14 DIAGNOSIS — N800 Endometriosis of uterus: Secondary | ICD-10-CM | POA: Insufficient documentation

## 2016-05-14 DIAGNOSIS — R102 Pelvic and perineal pain: Secondary | ICD-10-CM | POA: Insufficient documentation

## 2016-05-14 DIAGNOSIS — F418 Other specified anxiety disorders: Secondary | ICD-10-CM | POA: Diagnosis not present

## 2016-05-14 DIAGNOSIS — N8184 Pelvic muscle wasting: Secondary | ICD-10-CM | POA: Diagnosis not present

## 2016-05-14 DIAGNOSIS — G40909 Epilepsy, unspecified, not intractable, without status epilepticus: Secondary | ICD-10-CM | POA: Diagnosis not present

## 2016-05-14 DIAGNOSIS — G8929 Other chronic pain: Secondary | ICD-10-CM | POA: Insufficient documentation

## 2016-05-14 DIAGNOSIS — K219 Gastro-esophageal reflux disease without esophagitis: Secondary | ICD-10-CM | POA: Insufficient documentation

## 2016-05-14 DIAGNOSIS — M6028 Foreign body granuloma of soft tissue, not elsewhere classified, other site: Secondary | ICD-10-CM | POA: Diagnosis not present

## 2016-05-14 HISTORY — PX: CYSTOSCOPY: SHX5120

## 2016-05-14 HISTORY — PX: LAPAROSCOPIC BILATERAL SALPINGECTOMY: SHX5889

## 2016-05-14 HISTORY — PX: LAPAROSCOPIC HYSTERECTOMY: SHX1926

## 2016-05-14 LAB — HCG, SERUM, QUALITATIVE: PREG SERUM: NEGATIVE

## 2016-05-14 SURGERY — HYSTERECTOMY, TOTAL, LAPAROSCOPIC
Anesthesia: General | Site: Bladder

## 2016-05-14 MED ORDER — MORPHINE SULFATE (PF) 4 MG/ML IV SOLN
1.0000 mg | INTRAVENOUS | Status: DC | PRN
  Administered 2016-05-14 (×3): 2 mg via INTRAVENOUS
  Filled 2016-05-14 (×3): qty 1

## 2016-05-14 MED ORDER — ROCURONIUM BROMIDE 100 MG/10ML IV SOLN
INTRAVENOUS | Status: AC
  Filled 2016-05-14: qty 1

## 2016-05-14 MED ORDER — LAMOTRIGINE 100 MG PO TABS
200.0000 mg | ORAL_TABLET | Freq: Two times a day (BID) | ORAL | Status: DC
  Administered 2016-05-14 – 2016-05-15 (×2): 200 mg via ORAL
  Filled 2016-05-14 (×2): qty 2

## 2016-05-14 MED ORDER — GLYCOPYRROLATE 0.2 MG/ML IJ SOLN
INTRAMUSCULAR | Status: DC | PRN
  Administered 2016-05-14: 0.6 mg via INTRAVENOUS

## 2016-05-14 MED ORDER — SCOPOLAMINE 1 MG/3DAYS TD PT72
MEDICATED_PATCH | TRANSDERMAL | Status: AC
  Administered 2016-05-14: 1.5 mg via TRANSDERMAL
  Filled 2016-05-14: qty 1

## 2016-05-14 MED ORDER — KETOROLAC TROMETHAMINE 30 MG/ML IJ SOLN
INTRAMUSCULAR | Status: AC
  Filled 2016-05-14: qty 1

## 2016-05-14 MED ORDER — MIDAZOLAM HCL 2 MG/2ML IJ SOLN: 0.5000 mg | Freq: Once | INTRAMUSCULAR | Status: DC | PRN

## 2016-05-14 MED ORDER — HYDROMORPHONE HCL 1 MG/ML IJ SOLN
INTRAMUSCULAR | Status: AC
  Administered 2016-05-14: 0.5 mg via INTRAVENOUS
  Filled 2016-05-14: qty 1

## 2016-05-14 MED ORDER — MENTHOL 3 MG MT LOZG: 1.0000 | LOZENGE | OROMUCOSAL | Status: DC | PRN

## 2016-05-14 MED ORDER — KETOROLAC TROMETHAMINE 30 MG/ML IJ SOLN
INTRAMUSCULAR | Status: DC | PRN
  Administered 2016-05-14: 30 mg via INTRAVENOUS

## 2016-05-14 MED ORDER — FENTANYL CITRATE (PF) 100 MCG/2ML IJ SOLN
INTRAMUSCULAR | Status: DC | PRN
  Administered 2016-05-14: 50 ug via INTRAVENOUS
  Administered 2016-05-14 (×2): 100 ug via INTRAVENOUS

## 2016-05-14 MED ORDER — LEVETIRACETAM 500 MG PO TABS
1000.0000 mg | ORAL_TABLET | Freq: Every day | ORAL | Status: DC
  Administered 2016-05-14: 1000 mg via ORAL
  Filled 2016-05-14: qty 2

## 2016-05-14 MED ORDER — DICLOFENAC POTASSIUM(MIGRAINE) 50 MG PO PACK: 1.0000 | PACK | ORAL | Status: DC | PRN

## 2016-05-14 MED ORDER — SODIUM CHLORIDE 0.9 % IV SOLN
INTRAVENOUS | Status: DC | PRN
  Administered 2016-05-14: 09:00:00

## 2016-05-14 MED ORDER — SODIUM CHLORIDE 0.9 % IJ SOLN
INTRAMUSCULAR | Status: AC
  Filled 2016-05-14: qty 50

## 2016-05-14 MED ORDER — DEXTROSE-NACL 5-0.45 % IV SOLN
INTRAVENOUS | Status: DC
  Administered 2016-05-14: 12:00:00 via INTRAVENOUS

## 2016-05-14 MED ORDER — DEXAMETHASONE SODIUM PHOSPHATE 10 MG/ML IJ SOLN
INTRAMUSCULAR | Status: AC
  Filled 2016-05-14: qty 1

## 2016-05-14 MED ORDER — HYDROMORPHONE HCL 1 MG/ML IJ SOLN
INTRAMUSCULAR | Status: AC
  Filled 2016-05-14: qty 1

## 2016-05-14 MED ORDER — KETOROLAC TROMETHAMINE 30 MG/ML IJ SOLN
30.0000 mg | Freq: Four times a day (QID) | INTRAMUSCULAR | Status: DC
  Filled 2016-05-14: qty 1

## 2016-05-14 MED ORDER — PROPOFOL 10 MG/ML IV BOLUS
INTRAVENOUS | Status: DC | PRN
  Administered 2016-05-14: 200 mg via INTRAVENOUS

## 2016-05-14 MED ORDER — CEFAZOLIN SODIUM-DEXTROSE 2-3 GM-% IV SOLR
INTRAVENOUS | Status: DC | PRN
  Administered 2016-05-14: 2 g via INTRAVENOUS

## 2016-05-14 MED ORDER — ONDANSETRON HCL 4 MG/2ML IJ SOLN
INTRAMUSCULAR | Status: AC
  Filled 2016-05-14: qty 2

## 2016-05-14 MED ORDER — BUPIVACAINE HCL (PF) 0.25 % IJ SOLN
INTRAMUSCULAR | Status: AC
Start: 2016-05-14 — End: 2016-05-14
  Filled 2016-05-14: qty 30

## 2016-05-14 MED ORDER — ALUM & MAG HYDROXIDE-SIMETH 200-200-20 MG/5ML PO SUSP: 30.0000 mL | ORAL | Status: DC | PRN

## 2016-05-14 MED ORDER — ENOXAPARIN SODIUM 40 MG/0.4ML ~~LOC~~ SOLN
40.0000 mg | SUBCUTANEOUS | Status: AC
  Administered 2016-05-14: 40 mg via SUBCUTANEOUS
  Filled 2016-05-14: qty 0.4

## 2016-05-14 MED ORDER — DEXAMETHASONE SODIUM PHOSPHATE 4 MG/ML IJ SOLN
INTRAMUSCULAR | Status: DC | PRN
  Administered 2016-05-14: 4 mg via INTRAVENOUS

## 2016-05-14 MED ORDER — LACTATED RINGERS IR SOLN
Status: DC | PRN
  Administered 2016-05-14: 3000 mL

## 2016-05-14 MED ORDER — LEVETIRACETAM 500 MG PO TABS
500.0000 mg | ORAL_TABLET | Freq: Every morning | ORAL | Status: DC
  Administered 2016-05-15: 500 mg via ORAL
  Filled 2016-05-14: qty 1

## 2016-05-14 MED ORDER — SIMETHICONE 80 MG PO CHEW: 80.0000 mg | CHEWABLE_TABLET | Freq: Four times a day (QID) | ORAL | Status: DC | PRN

## 2016-05-14 MED ORDER — NEOSTIGMINE METHYLSULFATE 10 MG/10ML IV SOLN
INTRAVENOUS | Status: AC
  Filled 2016-05-14: qty 1

## 2016-05-14 MED ORDER — BUPIVACAINE HCL (PF) 0.25 % IJ SOLN
INTRAMUSCULAR | Status: DC | PRN
  Administered 2016-05-14: 19 mL

## 2016-05-14 MED ORDER — DEXAMETHASONE SODIUM PHOSPHATE 4 MG/ML IJ SOLN
INTRAMUSCULAR | Status: AC
  Filled 2016-05-14: qty 1

## 2016-05-14 MED ORDER — KETOROLAC TROMETHAMINE 30 MG/ML IJ SOLN
30.0000 mg | Freq: Four times a day (QID) | INTRAMUSCULAR | Status: DC
  Administered 2016-05-14 – 2016-05-15 (×3): 30 mg via INTRAVENOUS
  Filled 2016-05-14 (×2): qty 1

## 2016-05-14 MED ORDER — NEOSTIGMINE METHYLSULFATE 10 MG/10ML IV SOLN
INTRAVENOUS | Status: DC | PRN
  Administered 2016-05-14: 3 mg via INTRAVENOUS

## 2016-05-14 MED ORDER — GLYCOPYRROLATE 0.2 MG/ML IJ SOLN
INTRAMUSCULAR | Status: AC
  Filled 2016-05-14: qty 3

## 2016-05-14 MED ORDER — MIDAZOLAM HCL 5 MG/5ML IJ SOLN
INTRAMUSCULAR | Status: DC | PRN
  Administered 2016-05-14: 2 mg via INTRAVENOUS

## 2016-05-14 MED ORDER — ROPIVACAINE HCL 5 MG/ML IJ SOLN
INTRAMUSCULAR | Status: AC
  Filled 2016-05-14: qty 30

## 2016-05-14 MED ORDER — LACTATED RINGERS IV SOLN
INTRAVENOUS | Status: DC
  Administered 2016-05-14: 07:00:00 via INTRAVENOUS
  Administered 2016-05-14: 125 mL/h via INTRAVENOUS
  Administered 2016-05-14: 08:00:00 via INTRAVENOUS

## 2016-05-14 MED ORDER — ROCURONIUM BROMIDE 10 MG/ML (PF) SYRINGE
PREFILLED_SYRINGE | INTRAVENOUS | Status: DC | PRN
  Administered 2016-05-14: 5 mg via INTRAVENOUS

## 2016-05-14 MED ORDER — ROCURONIUM BROMIDE 100 MG/10ML IV SOLN
INTRAVENOUS | Status: DC | PRN
  Administered 2016-05-14: 45 mg via INTRAVENOUS
  Administered 2016-05-14: 10 mg via INTRAVENOUS
  Administered 2016-05-14: 5 mg via INTRAVENOUS

## 2016-05-14 MED ORDER — PROMETHAZINE HCL 25 MG/ML IJ SOLN: 6.2500 mg | INTRAMUSCULAR | Status: DC | PRN

## 2016-05-14 MED ORDER — AMITRIPTYLINE HCL 100 MG PO TABS
200.0000 mg | ORAL_TABLET | Freq: Every day | ORAL | Status: DC
Start: 2016-05-14 — End: 2016-05-15
  Administered 2016-05-14: 200 mg via ORAL
  Filled 2016-05-14 (×2): qty 2

## 2016-05-14 MED ORDER — MIDAZOLAM HCL 2 MG/2ML IJ SOLN
INTRAMUSCULAR | Status: AC
  Filled 2016-05-14: qty 2

## 2016-05-14 MED ORDER — ONDANSETRON HCL 4 MG/2ML IJ SOLN
INTRAMUSCULAR | Status: DC | PRN
  Administered 2016-05-14: 4 mg via INTRAVENOUS

## 2016-05-14 MED ORDER — FAMOTIDINE IN NACL 20-0.9 MG/50ML-% IV SOLN
20.0000 mg | Freq: Two times a day (BID) | INTRAVENOUS | Status: DC
  Administered 2016-05-14 (×2): 20 mg via INTRAVENOUS
  Filled 2016-05-14 (×3): qty 50

## 2016-05-14 MED ORDER — OXYCODONE-ACETAMINOPHEN 5-325 MG PO TABS
1.0000 | ORAL_TABLET | ORAL | Status: DC | PRN
  Administered 2016-05-14: 2 via ORAL
  Filled 2016-05-14: qty 2

## 2016-05-14 MED ORDER — LIDOCAINE 2% (20 MG/ML) 5 ML SYRINGE
INTRAMUSCULAR | Status: DC | PRN
  Administered 2016-05-14: 60 mg via INTRAVENOUS

## 2016-05-14 MED ORDER — LIDOCAINE HCL (CARDIAC) 20 MG/ML IV SOLN
INTRAVENOUS | Status: AC
  Filled 2016-05-14: qty 5

## 2016-05-14 MED ORDER — PROPOFOL 10 MG/ML IV BOLUS
INTRAVENOUS | Status: AC
  Filled 2016-05-14: qty 20

## 2016-05-14 MED ORDER — STERILE WATER FOR IRRIGATION IR SOLN
Status: DC | PRN
  Administered 2016-05-14: 1000 mL

## 2016-05-14 MED ORDER — FENTANYL CITRATE (PF) 250 MCG/5ML IJ SOLN
INTRAMUSCULAR | Status: AC
  Filled 2016-05-14: qty 5

## 2016-05-14 MED ORDER — HYDROMORPHONE HCL 1 MG/ML IJ SOLN
0.2500 mg | INTRAMUSCULAR | Status: DC | PRN
  Administered 2016-05-14 (×3): 0.5 mg via INTRAVENOUS

## 2016-05-14 MED ORDER — MEPERIDINE HCL 25 MG/ML IJ SOLN: 6.2500 mg | INTRAMUSCULAR | Status: DC | PRN

## 2016-05-14 MED ORDER — SCOPOLAMINE 1 MG/3DAYS TD PT72
1.0000 | MEDICATED_PATCH | Freq: Once | TRANSDERMAL | Status: DC
  Administered 2016-05-14: 1.5 mg via TRANSDERMAL

## 2016-05-14 MED ORDER — CLONAZEPAM 0.5 MG PO TABS: 0.5000 mg | ORAL_TABLET | Freq: Three times a day (TID) | ORAL | Status: DC | PRN

## 2016-05-14 MED ORDER — ACETAMINOPHEN 325 MG PO TABS: 650.0000 mg | ORAL_TABLET | ORAL | Status: DC | PRN

## 2016-05-14 MED ORDER — LORATADINE 10 MG PO TABS
10.0000 mg | ORAL_TABLET | ORAL | Status: DC | PRN
  Administered 2016-05-15: 10 mg via ORAL
  Filled 2016-05-14 (×2): qty 1

## 2016-05-14 SURGICAL SUPPLY — 62 items
APL SKNCLS STERI-STRIP NONHPOA (GAUZE/BANDAGES/DRESSINGS) ×3
APL SRG 38 LTWT LNG FL B (MISCELLANEOUS)
APPLICATOR ARISTA FLEXITIP XL (MISCELLANEOUS) IMPLANT
BAG SPEC RTRVL LRG 6X4 10 (ENDOMECHANICALS)
BENZOIN TINCTURE PRP APPL 2/3 (GAUZE/BANDAGES/DRESSINGS) ×1 IMPLANT
CABLE HIGH FREQUENCY MONO STRZ (ELECTRODE) ×3 IMPLANT
CATH ROBINSON RED A/P 16FR (CATHETERS) IMPLANT
CLOTH BEACON ORANGE TIMEOUT ST (SAFETY) ×4 IMPLANT
COVER BACK TABLE 60X90IN (DRAPES) ×4 IMPLANT
COVER LIGHT HANDLE  1/PK (MISCELLANEOUS) ×1
COVER LIGHT HANDLE 1/PK (MISCELLANEOUS) ×3 IMPLANT
DISSECTOR BLUNT TIP ENDO 5MM (MISCELLANEOUS) IMPLANT
DRSG COVADERM PLUS 2X2 (GAUZE/BANDAGES/DRESSINGS) ×6 IMPLANT
DRSG OPSITE POSTOP 3X4 (GAUZE/BANDAGES/DRESSINGS) ×1 IMPLANT
DURAPREP 26ML APPLICATOR (WOUND CARE) ×4 IMPLANT
GLOVE BIOGEL PI IND STRL 7.0 (GLOVE) ×15 IMPLANT
GLOVE BIOGEL PI INDICATOR 7.0 (GLOVE) ×5
GLOVE ECLIPSE 6.5 STRL STRAW (GLOVE) ×12 IMPLANT
GOWN STRL REUS W/TWL LRG LVL3 (GOWN DISPOSABLE) ×16 IMPLANT
HEMOSTAT ARISTA ABSORB 3G PWDR (MISCELLANEOUS) IMPLANT
LIGASURE BLUNT 5MM 37CM (INSTRUMENTS) ×4 IMPLANT
LIQUID BAND (GAUZE/BANDAGES/DRESSINGS) ×1 IMPLANT
NEEDLE INSUFFLATION 120MM (ENDOMECHANICALS) ×4 IMPLANT
NS IRRIG 1000ML POUR BTL (IV SOLUTION) ×4 IMPLANT
OCCLUDER COLPOPNEUMO (BALLOONS) ×4 IMPLANT
PACK LAPAROSCOPY BASIN (CUSTOM PROCEDURE TRAY) ×4 IMPLANT
PAD TRENDELENBURG POSITION (MISCELLANEOUS) ×4 IMPLANT
POUCH LAPAROSCOPIC INSTRUMENT (MISCELLANEOUS) ×1 IMPLANT
POUCH SPECIMEN RETRIEVAL 10MM (ENDOMECHANICALS) IMPLANT
SCALPEL HARMONIC ACE (MISCELLANEOUS) ×4 IMPLANT
SCISSORS LAP 5X35 DISP (ENDOMECHANICALS) IMPLANT
SEALER TISSUE G2 CVD JAW 35 (ENDOMECHANICALS) IMPLANT
SEALER TISSUE G2 CVD JAW 45CM (ENDOMECHANICALS)
SET CYSTO W/LG BORE CLAMP LF (SET/KITS/TRAYS/PACK) ×4 IMPLANT
SET IRRIG TUBING LAPAROSCOPIC (IRRIGATION / IRRIGATOR) ×4 IMPLANT
SET TRI-LUMEN FLTR TB AIRSEAL (TUBING) ×4 IMPLANT
SHEARS HARMONIC ACE PLUS 36CM (ENDOMECHANICALS) ×1 IMPLANT
SLEEVE ADV FIXATION 5X100MM (TROCAR) ×5 IMPLANT
SLEEVE XCEL OPT CAN 5 100 (ENDOMECHANICALS) ×1 IMPLANT
SOLUTION ELECTROLUBE (MISCELLANEOUS) ×1 IMPLANT
STRIP CLOSURE SKIN 1/2X4 (GAUZE/BANDAGES/DRESSINGS) IMPLANT
SUT VIC AB 0 CT1 27 (SUTURE) ×8
SUT VIC AB 0 CT1 27XBRD ANBCTR (SUTURE) ×6 IMPLANT
SUT VICRYL 0 UR6 27IN ABS (SUTURE) ×1 IMPLANT
SUT VICRYL 4-0 PS2 18IN ABS (SUTURE) ×4 IMPLANT
SUT VLOC 180 0 9IN  GS21 (SUTURE) ×1
SUT VLOC 180 0 9IN GS21 (SUTURE) ×3 IMPLANT
SYR 30ML LL (SYRINGE) ×1 IMPLANT
SYR 50ML LL SCALE MARK (SYRINGE) ×8 IMPLANT
TIP UTERINE 5.1X6CM LAV DISP (MISCELLANEOUS) IMPLANT
TIP UTERINE 6.7X10CM GRN DISP (MISCELLANEOUS) IMPLANT
TIP UTERINE 6.7X6CM WHT DISP (MISCELLANEOUS) IMPLANT
TIP UTERINE 6.7X8CM BLUE DISP (MISCELLANEOUS) ×1 IMPLANT
TOWEL OR 17X24 6PK STRL BLUE (TOWEL DISPOSABLE) ×8 IMPLANT
TRAY FOLEY CATH SILVER 14FR (SET/KITS/TRAYS/PACK) ×4 IMPLANT
TROCAR ADV FIXATION 5X100MM (TROCAR) ×8 IMPLANT
TROCAR BALLN 12MMX100 BLUNT (TROCAR) IMPLANT
TROCAR PORT AIRSEAL 8X120 (TROCAR) ×4 IMPLANT
TROCAR XCEL NON-BLD 11X100MML (ENDOMECHANICALS) IMPLANT
WARMER LAPAROSCOPE (MISCELLANEOUS) ×4 IMPLANT
WATER STERILE IRR 1000ML POUR (IV SOLUTION) ×5 IMPLANT
WATER STERILE IRR 1000ML UROMA (IV SOLUTION) ×1 IMPLANT

## 2016-05-14 NOTE — Transfer of Care (Signed)
Immediate Anesthesia Transfer of Care Note  Patient: April Campbell  Procedure(s) Performed: Procedure(s): HYSTERECTOMY TOTAL LAPAROSCOPIC (N/A) LAPAROSCOPIC BILATERAL SALPINGECTOMY (Bilateral) CYSTOSCOPY (N/A)  Patient Location: PACU  Anesthesia Type:General  Level of Consciousness: awake and oriented  Airway & Oxygen Therapy: Patient Spontanous Breathing and Patient connected to nasal cannula oxygen  Post-op Assessment: Report given to RN and Post -op Vital signs reviewed and stable  Post vital signs: Reviewed and stable  Last Vitals:  Filed Vitals:   05/14/16 0621  BP: 128/80  Pulse: 79  Temp: 36.8 C  Resp: 18    Last Pain: There were no vitals filed for this visit.    Patients Stated Pain Goal: 3 (05/14/16 16100621)  Complications: No apparent anesthesia complications

## 2016-05-14 NOTE — Op Note (Signed)
05/14/2016  10:31 AM  PATIENT:  April Campbell  40 y.o. female  PRE-OPERATIVE DIAGNOSIS:  Pelvic pain, history of endometriosis  POST-OPERATIVE DIAGNOSIS:  PELVIC PAIN STAGE 2 ENDOMETRIOSIS  PROCEDURE:  Procedure(s): HYSTERECTOMY TOTAL LAPAROSCOPIC LAPAROSCOPIC BILATERAL SALPINGECTOMY CYSTOSCOPY  SURGEON:  Ahmani Prehn SUZANNE  ASSISTANTS: Gertie Exon   ANESTHESIA:   general  ESTIMATED BLOOD LOSS: 50cc  BLOOD ADMINISTERED:none   FLUIDS: 1600cc LR  UOP: 550 cc clear UOP  SPECIMEN:  Uterus, cervix and bilateral fallopian tubes, two biopsies of peritoneum with endometrial implants in it  DISPOSITION OF SPECIMEN:  PATHOLOGY  FINDINGS: endometriosis on both uterosacral ligaments, down lower in posterior cul de sac on the colon, on left sidewall, on small bowels, no adhesions, small area on left ovary  DESCRIPTION OF OPERATION: Patient is taken to the operating room. She is placed in the supine position. She is a running IV in place. Informed consent was present on the chart. SCDs on her lower extremities and functioning properly. General endotracheal anesthesia was administered by the anesthesia staff without difficulty. Dr. Cristela Blue oversaw case. Once adequate anesthesia was confirmed the legs are placed in the low lithotomy position in Maury stirrups. Her arms were tucked by the side.   Dura prep was then used to prep the abdomen and Betadine was used to prep the inner thighs, perineum and vagina. Once 3 minutes had past the patient was draped in a normal standard fashion. The legs were lifted to the high lithotomy position. The cervix was visualized by placing a heavy weighted speculum in the posterior aspect of the vagina and using a curved Deaver retractor to the retract anteriorly. The anterior lip of the cervix was grasped with single-tooth tenaculum.  The cervix sounded to 8 cm. Pratt dilators were used to dilate the cervix up to a #21. A RUMI uterine manipulator was  obtained. A #8 disposable tip was placed on the RUMI manipulator as well as a small, silver KOH ring. This was passed through the cervix and the bulb of the disposable tip was inflated with 5cc of normal saline. There was a good fit of the KOH ring around the cervix. The tenaculum was removed. There is also good manipulation of the uterus. The speculum and retractor were removed as well. A Foley catheter was placed to straight drain. Clear urine was noted. Legs were lowered to the low lithotomy position and attention was turned the abdomen.  Left upper quadrant port was placed after using 0.25% Marcaine on the skin and nicking the skin with a #11 blade.  This was inserted under direct visualization.  Once intraperitoneal placement was confirmed, pneumoperitoneum was achieved with low flow of CO2 gas.  Locations for RLQ and LLQ ports were noted by transillumination of the abdominal wall.  0.25% marcaine was used to anesthetize the skin.  5mm skin incisions were made and then 5mm bladed ports were placed.  Finally a midline incision was made about 4 cm above the pubic symphysis.  The skin was incised about 1cm and a non-bladed 12 port was placed with direct visualization of the laparoscope.    Ureters were identifies.  Attention was turned to the left side. With uterus on stretch the left tube was excised off the ovary and mesosalpinx was dissected to free the tube. Then the left utero-ovarian pedicle was serially clamped cauterized and incised using the ligasure device. Left round ligament was serially clamped cauterized and incised. The anterior and posterior peritoneum of the inferior leaf  of the broad ligament were opened. The beginning of the baldde flap was created.  The bladder was taken down below the level of the KOH ring. The left uterine artery skeletonized and then just superior to the KOH ring this vessel was serially clamped, cauterized, and incised.  Attention was turned the right side.  The  uterus was placed on stretch to the opposite side.  The tube was excised off the ovary using sharp dissection a bipolar cautery.  The mesosalpinx was incised freeing the tube. Then the right uterine ovarian pedicle was serially clamped cauterized and incised. Next the right round ligament was serially clamped cauterized and incised. The anterior posterior peritoneum of the inferiorly for the broad ligament were opened. The anterior peritoneum was carried across to the dissection on the left side. The remainder of the bladder flap was created using sharp dissection. The bladder was well below the level of the KOH ring. The left uterine artery skeletonized. Then the left uterine artery, above the level of the KOH ring, was serially clamped cauterized and incised. The uterus was devascularized at this point.  At this point the endometriosis on the uterosacral ligaments was cauterized with monopolar cautery.  Two areas were excised for pathology biopsies.  The area on the small bowel and deep in the cul de sac could not be treated.    Then the colpotomy was performed a starting in the midline and using a harmonic scalpel with the inferior edge of the open blade  This was carried around a circumferential fashion until the vaginal mucosa was completely incised in the specimen was freed.  The specimen was then delivered to the vagina.  A vaginal occlusive device was used to maintain the pneumoperitoneum  Instruments were changed with a needle driver and million dollar graspers.  Using a 9 inch V. lock suture, the cuff was closed by incorporating the anterior and posterior vaginal mucosa in each stitch. This was carried across all the way to the left corner and a running fashion. Two stitches were brought back towards the midline and the suture was cut flush with the vagina. The needle was brought out the pelvis. The pelvis was irrigated. All pedicles were inspected. No bleeding was noted. In Interceed was placed  across vaginal cuff. Ureters were noted deep in the pelvis to be peristalsing.  At this point the procedure was completed. The largest port was closed with a fascial closure device after the port was removed.  Suture was tied and excellent closure of the fascia was noted.  The remaining instruments were removed.  The ports were removed under direct visualization of the laparoscope and the pneumoperitoneum was relieved.  The patient was taken out of Trendelenburg positioning.  Several deep breaths were given to the patient's trying to any gas the abdomen and finally the midline port was removed.  The skin was then closed with subcuticular stitches of 3-0 Vicryl. The skin was cleansed Dermabond was applied. Attention was then turned the vagina and the cuff was inspected. No bleeding was noted. The anterior posterior vaginal mucosa was incorporated in each stitch. The Foley catheter was removed.  Cystoscopy was performed.  No sutures or bladder injuries were noted.  Ureters were noted with normal urine jets from each one was seen.  Foley was replaced and the cystoscopic fluid was drained.  Sponge, lap, needle, initially counts were correct x2. Patient tolerated the procedure very well. She was awakened from anesthesia, extubated and taken to recovery in stable  condition.   COUNTS:  YES  PLAN OF CARE: Transfer to PACU

## 2016-05-14 NOTE — Anesthesia Postprocedure Evaluation (Signed)
Anesthesia Post Note  Patient: Charm BargesSusan L Clabaugh  Procedure(s) Performed: * No procedures listed *  Patient location during evaluation: [facility] Anesthesia Type: General Level of consciousness: awake and alert Pain management: pain level controlled Vital Signs Assessment: post-procedure vital signs reviewed and stable Respiratory status: spontaneous breathing, nonlabored ventilation, respiratory function stable and patient connected to nasal cannula oxygen Cardiovascular status: blood pressure returned to baseline and stable Postop Assessment: no signs of nausea or vomiting Anesthetic complications: no     Last Vitals:  Filed Vitals:   05/14/16 1136 05/14/16 1200  BP:  118/65  Pulse:  88  Temp: 36.8 C 36.8 C  Resp:  16    Last Pain:  Filed Vitals:   05/14/16 1557  PainSc: 6    Pain Goal: Patients Stated Pain Goal: 3 (05/14/16 1540)               Brian Zeitlin

## 2016-05-14 NOTE — Anesthesia Preprocedure Evaluation (Addendum)
Anesthesia Evaluation  Patient identified by MRN, date of birth, ID band Patient awake    Reviewed: Allergy & Precautions, NPO status , Patient's Chart, lab work & pertinent test results  History of Anesthesia Complications Negative for: history of anesthetic complications  Airway Mallampati: II  TM Distance: >3 FB Neck ROM: Full    Dental  (+) Dental Advisory Given   Pulmonary neg pulmonary ROS,    breath sounds clear to auscultation       Cardiovascular negative cardio ROS   Rhythm:Regular Rate:Normal     Neuro/Psych Seizures -, Well Controlled,  Anxiety Depression    GI/Hepatic Neg liver ROS, GERD  Medicated and Controlled,  Endo/Other  Morbid obesity  Renal/GU negative Renal ROS     Musculoskeletal negative musculoskeletal ROS (+)   Abdominal   Peds  Hematology negative hematology ROS (+)   Anesthesia Other Findings   Reproductive/Obstetrics                            Anesthesia Physical Anesthesia Plan  ASA: II  Anesthesia Plan: General   Post-op Pain Management:    Induction: Intravenous  Airway Management Planned: Oral ETT  Additional Equipment:   Intra-op Plan:   Post-operative Plan: Extubation in OR  Informed Consent: I have reviewed the patients History and Physical, chart, labs and discussed the procedure including the risks, benefits and alternatives for the proposed anesthesia with the patient or authorized representative who has indicated his/her understanding and acceptance.   Dental advisory given  Plan Discussed with:   Anesthesia Plan Comments: (Plan routine monitors, GETA)        Anesthesia Quick Evaluation

## 2016-05-14 NOTE — Anesthesia Procedure Notes (Signed)
Procedure Name: Intubation Date/Time: 05/14/2016 7:38 AM Performed by: Janeece AgeeWRAPE, Mariyana Fulop W Pre-anesthesia Checklist: Patient identified, Emergency Drugs available, Suction available, Patient being monitored and Timeout performed Patient Re-evaluated:Patient Re-evaluated prior to inductionOxygen Delivery Method: Circle system utilized Preoxygenation: Pre-oxygenation with 100% oxygen Intubation Type: IV induction Ventilation: Mask ventilation without difficulty Grade View: Grade II Tube type: Oral Tube size: 7.0 mm Number of attempts: 1 Airway Equipment and Method: Stylet Placement Confirmation: ETT inserted through vocal cords under direct vision,  positive ETCO2 and breath sounds checked- equal and bilateral Secured at: 22 cm Tube secured with: Tape Dental Injury: Teeth and Oropharynx as per pre-operative assessment

## 2016-05-14 NOTE — H&P (Signed)
April Campbell Clerk is an 40 y.o. female G0 MWF here for definitive treatment of chronic pelvic pain and history of endometriosis.  Pt has been counseled about alteratives and that this is a definitive surgery where no future child bearing is possible.  They are completely sure they do not want to have children.  She and spouse are here today and ready to proceed.  Pertinent Gynecological History: Menses: regular Bleeding: dysmenorrhea Contraception: none DES exposure: denies Blood transfusions: none Sexually transmitted diseases: no past history Previous GYN Procedures: none  Last mammogram: n/a Date: n/a Last pap: normal Date: 04/14/16 OB History: G0, P0   Menstrual History: No LMP recorded.    Past Medical History  Diagnosis Date  . Epilepsy (HCC)     last seizure - years ago  . History of migraine headaches   . GERD (gastroesophageal reflux disease)   . Endometriosis   . Depression   . Anxiety   . Seizures (HCC)     controlled with meds, last seizure 4-5 yrs ago  . Headache     Past Surgical History  Procedure Laterality Date  . Elbow surgery Left 2012  . Umbilical hernia repair      as an infant  . Colonoscopy with propofol N/A 02/10/2013    Procedure: COLONOSCOPY WITH PROPOFOL;  Surgeon: Charolett Bumpers, MD;  Location: [facility];  Service: Endoscopy;  Laterality: N/A;  . Diagnostic laparoscopy  12/15/2009    uterosacral nerve ablation  . Yag laser application  06/24/2006    vaporization of endometriosis  . Carpal tunnel release Right 06/14/2015    Procedure: RIGHT CARPAL TUNNEL RELEASE ENDOSCOPIC;  Surgeon: Mack Hook, MD;  Location: [facility];  Service: Orthopedics;  Laterality: Right;  . Wisdom tooth extraction      Family History  Problem Relation Age of Onset  . Hypertension Mother   . Diabetes Mother   . Hypertension Maternal Grandmother   . Diabetes Maternal Grandmother   . Other Father     bile duct cancer  . Cancer Father      Social History:  reports that she has never smoked. She has never used smokeless tobacco. She reports that she drinks about 3.6 oz of alcohol per week. She reports that she does not use illicit drugs.  Allergies:  Allergies  Allergen Reactions  . Wellbutrin [Bupropion Hcl] Other (See Comments)    SEIZURE  . Escitalopram Oxalate Other (See Comments)    Decreased sex drive  . Topiramate Other (See Comments)    foggy    Prescriptions prior to admission  Medication Sig Dispense Refill Last Dose  . amitriptyline (ELAVIL) 50 MG tablet Take 100 mg by mouth. Takes 2 at bedtime   05/13/2016 at 2200  . CAMBIA 50 MG PACK Take 1 each by mouth as needed (headache).   1 Past Month at Unknown time  . cholecalciferol (VITAMIN D) 1000 UNITS tablet Take 1,000 Units by mouth daily.   05/13/2016 at Unknown time  . clonazePAM (KLONOPIN) 0.5 MG tablet Take 0.5 mg by mouth as needed for anxiety.    05/13/2016 at 2200  . FIBER PO Take 1 packet by mouth daily.    05/13/2016 at Unknown time  . ibuprofen (ADVIL,MOTRIN) 200 MG tablet Take 200 mg by mouth every 6 (six) hours as needed for pain.   Past Week at Unknown time  . ibuprofen (ADVIL,MOTRIN) 800 MG tablet Take 1 tablet (800 mg total) by mouth every 8 (eight) hours as  needed. 30 tablet 0 Past Week at Unknown time  . lamoTRIgine (LAMICTAL) 100 MG tablet Take 200 mg by mouth 2 (two) times daily.    05/14/2016 at 0500  . levETIRAcetam (KEPPRA) 1000 MG tablet TAKE ONE-HALF TABLET BY MOUTH EVERY MORNING & ONE TABLET BY MOUTH AT BEDTIME  0 05/14/2016 at 0500  . loratadine (CLARITIN) 10 MG tablet Take 10 mg by mouth as needed for allergies. Reported on 12/13/2015   05/13/2016 at Unknown time  . Multiple Vitamins-Minerals (MULTIVITAMIN PO) Take 1 tablet by mouth daily.    05/13/2016 at Unknown time  . ranitidine (ZANTAC) 150 MG tablet Take 150 mg by mouth as needed for heartburn.    05/14/2016 at 0500  . oxyCODONE-acetaminophen (PERCOCET) 5-325 MG tablet Take 2 tablets by  mouth every 4 (four) hours as needed. use only as much as needed to relieve pain 30 tablet 0 More than a month at Unknown time    Review of Systems  All other systems reviewed and are negative.   Blood pressure 128/80, pulse 79, temperature 98.3 F (36.8 C), resp. rate 18, SpO2 99 %. Physical Exam  Constitutional: She is oriented to [place], place, and time. She appears well-developed and well-nourished.  Cardiovascular: Normal rate and regular rhythm.   Respiratory: Effort normal and breath sounds normal.  Neurological: She is alert and oriented to [place], place, and time.  Skin: Skin is warm and dry.  Psychiatric: She has a normal mood and affect.    Results for orders placed or performed during the hospital encounter of 05/14/16 (from the past 24 hour(s))  hCG, serum, qualitative     Status: None   Collection Time: 05/14/16  6:05 AM  Result Value Ref Range   Preg, Serum NEGATIVE NEGATIVE    No results found.  Assessment/Plan: 40 yo G0 MWF here for total laparoscopic hysterectomy and bilateral salpingectomy with cystoscopy.  Possible treatement of endometriosis and possible BSO has also been discussed with the patient.  She is comfortable with this.  [name], [name] SUZANNE 05/14/2016, 7:02 AM

## 2016-05-14 NOTE — Progress Notes (Signed)
Day of Surgery Procedure(s) (LRB): HYSTERECTOMY TOTAL LAPAROSCOPIC (N/A) LAPAROSCOPIC BILATERAL SALPINGECTOMY (Bilateral) CYSTOSCOPY (N/A)  This note is a late entry.  Pt seen at around 6pm.  Subjective: Patient reports no nausea or emesis.  No vaginal bleeding.  Has voided x 2.  Reports she is having more pain than she expected and is nervous about taking pain medications.  She's gotten IV morphine x 2.  D/w pt current dosage and timing for order.  She understands she can ask for this much more frequently if needed.  Objective: I have reviewed patient's vital signs, intake and output, medications and labs. Filed Vitals:   05/14/16 1200 05/14/16 1500 05/14/16 1759 05/14/16 2203  BP: 118/65  106/65 100/53  Pulse: 88  82 91  Temp: 98.2 F (36.8 C)  98 F (36.7 C) 97.8 F (36.6 C)  TempSrc:   Oral Oral  Resp: 16  16 16   Height:  5\' 5"  (1.651 m)    Weight:  194 lb (87.998 kg)    SpO2: 96%  100% 95%     General: alert and cooperative Resp: clear to auscultation bilaterally Cardio: regular rate and rhythm, S1, S2 normal, no murmur, click, rub or gallop GI: soft, NT, ND, occ BS Extremities: extremities normal, atraumatic, no cyanosis or edema and SCDs on and functioning Vaginal Bleeding: minimal  Assessment: s/p Procedure(s): HYSTERECTOMY TOTAL LAPAROSCOPIC (N/A) LAPAROSCOPIC BILATERAL SALPINGECTOMY (Bilateral) CYSTOSCOPY (N/A): stable and progressing well  Plan: Advance diet Encourage ambulation CBC in AM     Shaquina Gillham SUZANNE 05/14/2016, 10:50 PM

## 2016-05-15 DIAGNOSIS — N946 Dysmenorrhea, unspecified: Secondary | ICD-10-CM | POA: Diagnosis not present

## 2016-05-15 DIAGNOSIS — K219 Gastro-esophageal reflux disease without esophagitis: Secondary | ICD-10-CM | POA: Diagnosis not present

## 2016-05-15 DIAGNOSIS — N838 Other noninflammatory disorders of ovary, fallopian tube and broad ligament: Secondary | ICD-10-CM | POA: Diagnosis not present

## 2016-05-15 DIAGNOSIS — G8929 Other chronic pain: Secondary | ICD-10-CM | POA: Diagnosis not present

## 2016-05-15 DIAGNOSIS — Z6832 Body mass index (BMI) 32.0-32.9, adult: Secondary | ICD-10-CM | POA: Diagnosis not present

## 2016-05-15 DIAGNOSIS — F418 Other specified anxiety disorders: Secondary | ICD-10-CM | POA: Diagnosis not present

## 2016-05-15 DIAGNOSIS — N800 Endometriosis of uterus: Secondary | ICD-10-CM | POA: Diagnosis not present

## 2016-05-15 DIAGNOSIS — R102 Pelvic and perineal pain: Secondary | ICD-10-CM | POA: Diagnosis not present

## 2016-05-15 DIAGNOSIS — G40909 Epilepsy, unspecified, not intractable, without status epilepticus: Secondary | ICD-10-CM | POA: Diagnosis not present

## 2016-05-15 LAB — BASIC METABOLIC PANEL
Anion gap: 3 — ABNORMAL LOW (ref 5–15)
BUN: 11 mg/dL (ref 6–20)
CHLORIDE: 105 mmol/L (ref 101–111)
CO2: 30 mmol/L (ref 22–32)
CREATININE: 0.63 mg/dL (ref 0.44–1.00)
Calcium: 8.2 mg/dL — ABNORMAL LOW (ref 8.9–10.3)
GFR calc non Af Amer: >60 mL/min (ref >60)
GLUCOSE: 100 mg/dL — AB (ref 65–99)
Potassium: 3.8 mmol/L (ref 3.5–5.1)
Sodium: 138 mmol/L (ref 135–145)

## 2016-05-15 LAB — CBC
HCT: 32.2 % — ABNORMAL LOW (ref 36.0–46.0)
Hemoglobin: 10.7 g/dL — ABNORMAL LOW (ref 12.0–15.0)
MCH: 30.6 pg (ref 26.0–34.0)
MCHC: 33.2 g/dL (ref 30.0–36.0)
MCV: 92 fL (ref 78.0–100.0)
PLATELETS: 250 10*3/uL (ref 150–400)
RBC: 3.5 MIL/uL — AB (ref 3.87–5.11)
RDW: 13.6 % (ref 11.5–15.5)
WBC: 8.8 10*3/uL (ref 4.0–10.5)

## 2016-05-15 MED ORDER — FAMOTIDINE 20 MG PO TABS
20.0000 mg | ORAL_TABLET | Freq: Two times a day (BID) | ORAL | Status: DC
  Administered 2016-05-15: 20 mg via ORAL
  Filled 2016-05-15: qty 1

## 2016-05-15 NOTE — Progress Notes (Signed)
Pt out in wheelchair  Teaching complete   

## 2016-05-15 NOTE — Discharge Instructions (Signed)
Post Op Hysterectomy Instructions Please read the instructions below. Refer to these instructions for the next few weeks. These instructions provide you with general information on caring for yourself after surgery. Your caregiver may also give you specific instructions. While your treatment has been planned according to the most current medical practices available, unavoidable problems sometimes happen. If you have any problems or questions after you leave, please call your caregiver.  HOME CARE INSTRUCTIONS Healing will take time. You will have discomfort, tenderness, swelling and bruising at the operative site for a couple of weeks. This is normal and will get better as time goes on.  Only take over-the-counter or prescription medicines for pain, discomfort or fever as directed by your caregiver.  Do not take aspirin. It can cause bleeding.  Do not drive when taking pain medication.  Follow your caregiver's advice regarding diet, exercise, lifting, driving and general activities.  Resume your usual diet as directed and allowed.  Get plenty of rest and sleep.  Do not douche, use tampons, or have sexual intercourse until your caregiver gives you permission. .  Take your temperature if you feel hot or flushed.  You may shower today when you get home.  No tub bath for one week.   Do not drink alcohol until you are not taking any narcotic pain medications.  Try to have someone home with you for a week or two to help with the household activities.   Be careful over the next two to three weeks with any activities at home that involve lifting, pushing, or pulling.  Listen to your body--if something feels uncomfortable to do, then don't do it. Make sure you and your family understands everything about your operation and recovery.  Walking up stairs is fine. Do not sign any legal documents until you feel normal again.  Keep all your follow-up appointments as recommended by your caregiver.   PLEASE CALL  THE OFFICE IF: There is swelling, redness or increasing pain in the wound area.  Pus is coming from the wound.  You notice a bad smell from the wound or surgical dressing.  You have pain, redness and swelling from the intravenous site.  The wound is breaking open (the edges are not staying together).   You develop pain or bleeding when you urinate.  You develop abnormal vaginal discharge.  You have any type of abnormal reaction or develop an allergy to your medication.  You need stronger pain medication for your pain   SEEK IMMEDIATE MEDICAL CARE: You develop a temperature of 100.5 or higher.  You develop abdominal pain.  You develop chest pain.  You develop shortness of breath.  You pass out.  You develop pain, swelling or redness of your leg.  You develop heavy vaginal bleeding with or without blood clots.   MEDICATIONS: Restart your regular medications BUT wait one week before restarting all vitamins and mineral supplements Use Motrin 800mg every 8 hours for the next several days.  This will help you use less Percocet.  Use the Percocet 5/325 1-2 tabs every 4-6 hours as needed for pain. You may use an over the counter stool softener like Colace or Dulcolax to help with starting a bowel movement.  Start the day after you go home.  Warm liquids, fluids, and ambulation help too.  If you have not had a bowel movement in four days, you need to call the office.  

## 2016-05-15 NOTE — Discharge Summary (Signed)
Physician Discharge Summary  Patient ID: April Campbell MRN: [id number] DOB/AGE: [birth year] 40 y.o.  Admit date: 05/14/2016 Discharge date: 05/15/2016  Admission Diagnoses:  Female pelvic pain, endometriosis, dysmenorrhea  Discharge Diagnoses:  Active Problems:   Endometriosis  Discharged Condition: stable  Hospital Course: Patient admitted through same day surgery.  She was taken to OR where TLH/bilateral salpingectomy/cystoscopy were performed.  Surgical findings included stage 2 endometriosis.  Surgery was uneventful.  EBL 50cc.  Foley catheter was removed before leaving OR.  Patient transferred to PACU where she was stable and then to 3rd floor for the remainder of her hospitalization.  During her post-op recovery, her vitals and stable and she was AF.  In evening of POD#0, she was able to transition to oral pain medications and regular diet.  She was able to ambulate and she had good pain control.  She was also able to void on her own.  Patient seen both in the evening of POD#0 and AM of POD#1.  In the AM of POD#1, she was without complaint.  Post op hb was 10.7, decreased from 11.4, pre-operatively.  At this point, patient was voiding, walking, having excellent pain control, had no nausea, and minimal vaginal bleeding.  She was ready for D/C.  Consults: None  Significant Diagnostic Studies: labs: post op hb 10.7  Treatments: surgery: TLH/bilateral salpingectomy/cystoscopy  Discharge Exam: Blood pressure 106/59, pulse 86, temperature 99 F (37.2 C), temperature source Oral, resp. rate 16, height 5\' 5"  (1.651 m), weight 194 lb (87.998 kg), SpO2 96 %. General appearance: alert and cooperative Resp: clear to auscultation bilaterally Cardio: regular rate and rhythm, S1, S2 normal, no murmur, click, rub or gallop GI: soft, non-tender; bowel sounds normal; no masses,  no organomegaly Extremities: extremities normal, atraumatic, no cyanosis or edema Incision/Wound:c/d/i  Disposition:  01-Home or Self Care     Medication List    TAKE these medications        amitriptyline 50 MG tablet  Commonly known as:  ELAVIL  Take 100 mg by mouth. Takes 2 at bedtime     CAMBIA 50 MG Pack  Generic drug:  Diclofenac Potassium  Take 1 each by mouth as needed (headache).     cholecalciferol 1000 units tablet  Commonly known as:  VITAMIN D  Take 1,000 Units by mouth daily.     clonazePAM 0.5 MG tablet  Commonly known as:  KLONOPIN  Take 0.5 mg by mouth as needed for anxiety.     FIBER PO  Take 1 packet by mouth daily.     ibuprofen 200 MG tablet  Commonly known as:  ADVIL,MOTRIN  Take 200 mg by mouth every 6 (six) hours as needed for pain.     ibuprofen 800 MG tablet  Commonly known as:  ADVIL,MOTRIN  Take 1 tablet (800 mg total) by mouth every 8 (eight) hours as needed.     lamoTRIgine 100 MG tablet  Commonly known as:  LAMICTAL  Take 200 mg by mouth 2 (two) times daily.     levETIRAcetam 1000 MG tablet  Commonly known as:  KEPPRA  TAKE ONE-HALF TABLET BY MOUTH EVERY MORNING & ONE TABLET BY MOUTH AT BEDTIME     loratadine 10 MG tablet  Commonly known as:  CLARITIN  Take 10 mg by mouth as needed for allergies. Reported on 12/13/2015     MULTIVITAMIN PO  Take 1 tablet by mouth daily.     oxyCODONE-acetaminophen 5-325 MG tablet  Commonly known as:  PERCOCET  Take 2 tablets by mouth every 4 (four) hours as needed. use only as much as needed to relieve pain     ranitidine 150 MG tablet  Commonly known as:  ZANTAC  Take 150 mg by mouth as needed for heartburn.           Follow-up Information    Follow up with Annamaria Boots, MD On 05/21/2016.   Specialty:  Gynecology   Why:  appt time 12:45   Contact information:   [address] [place] [address] [phone]       Signed: Annamaria Boots 05/15/2016, 7:39 AM

## 2016-05-15 NOTE — Anesthesia Postprocedure Evaluation (Signed)
Anesthesia Post Note  Patient: April Campbell  Procedure(s) Performed: Procedure(s) (LRB): HYSTERECTOMY TOTAL LAPAROSCOPIC (N/A) LAPAROSCOPIC BILATERAL SALPINGECTOMY (Bilateral) CYSTOSCOPY (N/A)  Patient location during evaluation: [facility] Anesthesia Type: General Level of consciousness: awake and alert Pain management: pain level controlled Vital Signs Assessment: post-procedure vital signs reviewed and stable Respiratory status: spontaneous breathing Cardiovascular status: stable Postop Assessment: no signs of nausea or vomiting Anesthetic complications: no     Last Vitals:  Filed Vitals:   05/15/16 0201 05/15/16 0606  BP: 106/54 106/59  Pulse: 81 86  Temp: 36.4 C 37.2 C  Resp: 16 16    Last Pain:  Filed Vitals:   05/15/16 0612  PainSc: 0-No pain   Pain Goal: Patients Stated Pain Goal: 3 (05/14/16 2157)               Edison PaceWILKERSON,Jorian [place]

## 2016-05-17 ENCOUNTER — Encounter (HOSPITAL_COMMUNITY): Payer: Self-pay | Admitting: Obstetrics & Gynecology

## 2016-05-18 ENCOUNTER — Telehealth: Payer: Self-pay | Admitting: Obstetrics & Gynecology

## 2016-05-18 ENCOUNTER — Other Ambulatory Visit: Payer: Self-pay | Admitting: Obstetrics & Gynecology

## 2016-05-18 MED ORDER — OXYCODONE-ACETAMINOPHEN 5-325 MG PO TABS: 1.0000 | ORAL_TABLET | Freq: Four times a day (QID) | ORAL | Status: DC | PRN

## 2016-05-18 NOTE — Telephone Encounter (Signed)
Patient's husband called and requested "more pain medicine for my wife." She recently had surgery and reportedly has used up all her pain medicine. DPR on file to share PHI with spouse.  Pharmacy on file is correct.

## 2016-05-18 NOTE — Telephone Encounter (Signed)
Spoke with patient's husband, Griff. Advised I have spoken with Dr.Miller and new rx for Percocet 5/325 mg has been written. Goal is for the patient to reduce to taking Percocet every 6 hours and then every 8 hours. May still alternate using Ibuprofen 800 mg every 8 hours. Griff states the patient's incisions are healing well and denies any fever or chills. Advised rx will be placed at the front desk for pick up and he will need to show ID for pick up. He is agreeable.  Routing to provider for final review. Patient agreeable to disposition. Will close encounter.

## 2016-05-18 NOTE — Telephone Encounter (Signed)
Patient had a total laparoscopic hysterectomy with bilateral salpingectomy on 05/14/2016. Spoke with patients husband, Griff, okay per ROI. Griff states that the patient is taking Percocet 5/325 mg every 4 hours. Reports she was taking 2 pills every 4 hours until 05/16/2016 when she started taking 1 pill every 4 hours. She is taking Ibuprofen 800 mg every 8 hours. Reports the patient states her pain is a 6/10 without medication and is a 3/10 with use of Percocet and Ibuprofen. Griff states that there is one pill left of Percocet at this time. Patient is requesting a refill to get her to her appointment with Dr.Miller on 05/21/2016. Advised I will speak with Dr.Miller and return call with further recommendations.

## 2016-05-21 ENCOUNTER — Encounter: Payer: Self-pay | Admitting: Obstetrics & Gynecology

## 2016-05-21 ENCOUNTER — Ambulatory Visit (INDEPENDENT_AMBULATORY_CARE_PROVIDER_SITE_OTHER): Payer: BLUE CROSS/BLUE SHIELD | Admitting: Obstetrics & Gynecology

## 2016-05-21 VITALS — BP 118/74 | HR 84 | Temp 98.5°F | Ht 65.0 in | Wt 190.4 lb

## 2016-05-21 DIAGNOSIS — Z9889 Other specified postprocedural states: Secondary | ICD-10-CM

## 2016-05-21 NOTE — Progress Notes (Signed)
Post Operative Visit  Procedure: Laparoscopic Hysterectomy, Bilateral Salpingectomy, Cystoscopy.  Days Post-op:  7 days  Subjective: Reports she is still having pain.  Taking her Percocet about every 8 hours.  She is having more constipation than is typical for her.  She is using fiber and colace both twice daily.  No nausea.  Last BM was yesterday but was hard.  Denies fevers.  Appetite is good.  Minimal spotting.  None in about three days.    Objective: BP 118/74 (BP Location: Right Arm, Patient Position: Sitting, Cuff Size: Normal)   Pulse 84   Temp 98.5 F (36.9 C) (Oral)   Ht 5\' 5"  (1.651 m)   Wt 190 lb 6.4 oz (86.4 kg)   LMP 04/27/2016 (Approximate)   BMI 31.68 kg/m   EXAM General: alert and cooperative Resp: clear to auscultation bilaterally Cardio: regular rate and rhythm, S1, S2 normal, no murmur, click, rub or gallop GI: soft, non-tender; bowel sounds normal; no masses,  no organomegaly Extremities: extremities normal, atraumatic, no cyanosis or edema Vaginal Bleeding: none  Assessment: s/p TLH/bilateral salpingectomy/Cystoscopy Constipation  Plan: Recheck 3 weeks Pt will add MOM, 2 tbs up to AM and PM to current regimen

## 2016-05-28 ENCOUNTER — Ambulatory Visit: Payer: BLUE CROSS/BLUE SHIELD | Admitting: Obstetrics & Gynecology

## 2016-05-29 ENCOUNTER — Other Ambulatory Visit: Payer: Self-pay | Admitting: Obstetrics & Gynecology

## 2016-05-29 NOTE — Telephone Encounter (Signed)
Medication refill request: Ibuprofen  Last AEX:  07/07/15 DL Next AEX: 05/17/93 DL Last MMG (if hormonal medication request): n/a Refill authorized: 04/23/16 #[id number]. Please advise. Thank you.

## 2016-05-30 ENCOUNTER — Telehealth: Payer: Self-pay | Admitting: Obstetrics & Gynecology

## 2016-05-30 NOTE — Telephone Encounter (Signed)
Patient had surgery two weeks ago and is having pain. She would like to speak with the nurse.

## 2016-05-30 NOTE — Telephone Encounter (Signed)
Returned call to patient. Patient had Total Laparoscopic hysterectomy, bilateral salpingectomy,cystoscopy on 05/14/2016. Patient calling stating that Saturday 05/26/16 she started having burning near her incision site that progressively got worse and is now "aching and stabbing from hip to hip." She also states the pain intermittently shoots to her groin. Patient states she has been taking 800 mg of Ibuprofen every eight hours with no relief of pain. Denies fever and chills. Patient states she has had light spotting, and recently had a bowel movement. Patient aware Dr. Hyacinth Meeker is out of the office the rest of the week and is agreeable to be seen by Dr. Edward Jolly. Office visit scheduled for Thursday 05/31/2016 at 0815. Patient agreeable to date and time.   Routing to provider for review. Will close encounter.

## 2016-05-31 ENCOUNTER — Ambulatory Visit (INDEPENDENT_AMBULATORY_CARE_PROVIDER_SITE_OTHER): Payer: BLUE CROSS/BLUE SHIELD | Admitting: Obstetrics and Gynecology

## 2016-05-31 ENCOUNTER — Encounter: Payer: Self-pay | Admitting: Obstetrics and Gynecology

## 2016-05-31 VITALS — BP 110/74 | HR 80 | Temp 98.5°F | Ht 66.0 in | Wt 188.8 lb

## 2016-05-31 DIAGNOSIS — R319 Hematuria, unspecified: Secondary | ICD-10-CM

## 2016-05-31 DIAGNOSIS — R102 Pelvic and perineal pain: Secondary | ICD-10-CM | POA: Diagnosis not present

## 2016-05-31 LAB — POCT URINALYSIS DIPSTICK
BILIRUBIN UA: NEGATIVE
Glucose, UA: NEGATIVE
Ketones, UA: NEGATIVE
NITRITE UA: NEGATIVE
PH UA: 5
Protein, UA: NEGATIVE
Urobilinogen, UA: NEGATIVE

## 2016-05-31 MED ORDER — OXYCODONE-ACETAMINOPHEN 5-325 MG PO TABS: 1.0000 | ORAL_TABLET | ORAL | 0 refills | Status: DC | PRN

## 2016-05-31 NOTE — Progress Notes (Signed)
GYNECOLOGY  VISIT   HPI: 40 y.o.   Married  Caucasian  female   G0P0000 with Patient's last menstrual period was 04/27/2016 (approximate).   here for lower pelvic pain 2 week post HYSTERECTOMY TOTAL LAPAROSCOPIC (N/A Abdomen) [[id number] CPT (R)] LAPAROSCOPIC BILATERAL SALPINGECTOMY (Bilateral Abdomen)CYSTOSCOPY (N/A Bladder). Had uterosacral ligament endometriosis which was fulgurated.   Also had peritoneal biopsies.   Final pathology showed adenomyosis, paratubal cysts, benign cervix and foreign body giant cell reaction in the peritoneal biopsies.  Presenting today with burning of her lower abdominal midline incision.  Also having aching across her lower abdomen, left more than right for 2 days.  Now having shooting pain in the vagina.  Motrin not helping for pain. Started spotting again, and this is mild.  No bleeding today.  BMs once per day.  States hx of constipation prior to surgery.   Now on stool softener, probiotics, and Consul fiber.  Not straining to have BMs, which are soft. Pain in the vagina is causing nausea but not vomiting.  Urinary frequency as she is drinking a lot of water.  Denies urgency, burning.   Would be due for her menses if she were to have her usual cycle.  Has increased her activity over the last 5 days because she thought she was not doing enough.  Urine dip today - 1+ WBCs and 1+ RBCs.  GYNECOLOGIC HISTORY: Patient's last menstrual period was 04/27/2016 (approximate). Contraception:  Hysterectomy Menopausal hormone therapy:  none Last mammogram:  N/A Last pap smear:   04-24-16 Neg:        OB History    Gravida Para Term Preterm AB Living   0 0 0 0 0 0   SAB TAB Ectopic Multiple Live Births   0 0 0 0           Patient Active Problem List   Diagnosis Date Noted  . Endometriosis 04/25/2016  . PCOS (polycystic ovarian syndrome) 03/19/2016  . Pelvic pain in female 03/19/2016  . Epilepsy with partial complex seizures (HCC) 01/21/2016  .  Idiopathic generalized epilepsy (HCC) 12/06/2015  . Migraine without aura and responsive to treatment 12/06/2015    Past Medical History:  Diagnosis Date  . Anxiety   . Depression   . Endometriosis   . Epilepsy (HCC)    last seizure - years ago  . GERD (gastroesophageal reflux disease)   . Headache   . History of migraine headaches   . Seizures (HCC)    controlled with meds, last seizure 4-5 yrs ago    Past Surgical History:  Procedure Laterality Date  . CARPAL TUNNEL RELEASE Right 06/14/2015   Procedure: RIGHT CARPAL TUNNEL RELEASE ENDOSCOPIC;  Surgeon: Mack Hook, MD;  Location: [facility];  Service: Orthopedics;  Laterality: Right;  . COLONOSCOPY WITH PROPOFOL N/A 02/10/2013   Procedure: COLONOSCOPY WITH PROPOFOL;  Surgeon: Charolett Bumpers, MD;  Location: [facility];  Service: Endoscopy;  Laterality: N/A;  . CYSTOSCOPY N/A 05/14/2016   Procedure: CYSTOSCOPY;  Surgeon: Jerene Bears, MD;  Location: [facility];  Service: Gynecology;  Laterality: N/A;  . DIAGNOSTIC LAPAROSCOPY  12/15/2009   uterosacral nerve ablation  . ELBOW SURGERY Left 2012  . LAPAROSCOPIC BILATERAL SALPINGECTOMY Bilateral 05/14/2016   Procedure: LAPAROSCOPIC BILATERAL SALPINGECTOMY;  Surgeon: Jerene Bears, MD;  Location: [facility];  Service: Gynecology;  Laterality: Bilateral;  . LAPAROSCOPIC HYSTERECTOMY N/A 05/14/2016   Procedure: HYSTERECTOMY TOTAL LAPAROSCOPIC;  Surgeon: Jerene Bears, MD;  Location: [facility];  Service: Gynecology;  Laterality: N/A;  . UMBILICAL HERNIA REPAIR     as an infant  . WISDOM TOOTH EXTRACTION    . YAG LASER APPLICATION  06/24/2006   vaporization of endometriosis    Current Outpatient Prescriptions  Medication Sig Dispense Refill  . amitriptyline (ELAVIL) 50 MG tablet Take 100 mg by mouth. Takes 2 at bedtime    . cholecalciferol (VITAMIN D) 1000 UNITS tablet Take 1,000 Units by mouth daily.    . clonazePAM (KLONOPIN) 0.5 MG tablet Take 0.5 mg by mouth as needed (as  needed for seizures).     . FIBER PO Take 1 packet by mouth daily.     Marland Kitchen ibuprofen (ADVIL,MOTRIN) 800 MG tablet TAKE 1 TABLET BY MOUTH EVERY 8 HOURS AS NEEDED 30 tablet 1  . lamoTRIgine (LAMICTAL) 100 MG tablet Take 200 mg by mouth 2 (two) times daily.     Marland Kitchen levETIRAcetam (KEPPRA) 1000 MG tablet TAKE ONE-HALF TABLET BY MOUTH EVERY MORNING & ONE TABLET BY MOUTH AT BEDTIME  0  . loratadine (CLARITIN) 10 MG tablet Take 10 mg by mouth as needed for allergies. Reported on 12/13/2015    . Multiple Vitamins-Minerals (MULTIVITAMIN PO) Take 1 tablet by mouth daily.     . ranitidine (ZANTAC) 150 MG tablet Take 150 mg by mouth as needed for heartburn.      No current facility-administered medications for this visit.      ALLERGIES: Wellbutrin [bupropion hcl]; Escitalopram oxalate; and Topiramate  Family History  Problem Relation Age of Onset  . Hypertension Mother   . Diabetes Mother   . Hypertension Maternal Grandmother   . Diabetes Maternal Grandmother   . Other Father     bile duct cancer  . Cancer Father     Social History   Social History  . Marital status: Married    Spouse name: N/A  . Number of children: N/A  . Years of education: N/A   Occupational History  . Not on file.   Social History Main Topics  . Smoking status: Never Smoker  . Smokeless tobacco: Never Used  . Alcohol use 3.6 oz/week    4 Glasses of wine, 2 Standard drinks or equivalent per week  . Drug use: No  . Sexual activity: Yes    Partners: Male     Comment: husband vasectomy   Other Topics Concern  . Not on file   Social History Narrative  . No narrative on file    ROS:  Pertinent items are noted in HPI.  PHYSICAL EXAMINATION:    BP 110/74 (BP Location: Right Arm, Patient Position: Sitting, Cuff Size: Normal)   Pulse 80   Ht 5\' 6"  (1.676 m)   Wt 188 lb 12.8 oz (85.6 kg)   LMP 04/27/2016 (Approximate)   BMI 30.47 kg/m     General appearance: alert, cooperative and appears stated age Lungs:  clear to auscultation bilaterally Heart: regular rate and rhythm Abdomen: normal bowel sounds, incisions intact and without herniation, soft, no distention, non-tender, no masses,  no organomegaly Extremities: extremities normal, atraumatic, no cyanosis or edema Skin: Skin color, texture, turgor normal. No rashes or lesions Lymph nodes: Cervical, supraclavicular, and axillary nodes normal. No abnormal inguinal nodes palpated Neurologic: Grossly normal  Pelvic: External genitalia:  no lesions              Urethra:  normal appearing urethra with no masses, tenderness or lesions  Bartholins and Skenes: normal                 Vagina: normal appearing vagina with normal color and discharge, vaginal cuff intact - fresh incision.  No significant erythema.  No drainage or bleeding.               Cervix:  absent                Bimanual Exam:  Uterus:   Absent.  Tender with vaginal examining hand.              Adnexa: no mass, fullness, tenderness.               Chaperone was present for exam.  ASSESSMENT  Post op pelvic pain.  No acute abdomin.  No evidence of cuff cellulitis. Abnormal urine dip.   PLAN  Discussed normal post op recovery expectations.  Decrease activity level slightly.  Rx for Percocet 5/325 mg, one po q 4 hours prn pain.  #10, RF zero.  Cautioned about effect of constipation.  Will check UC.  No abx at this time. Follow up with Dr. Hyacinth Meeker for 4 week check.  Return for fever, vomiting, increased pain, abd bloating, active vaginal bleeding.    An After Visit Summary was printed and given to the patient.

## 2016-06-01 LAB — URINE CULTURE

## 2016-06-08 ENCOUNTER — Ambulatory Visit (INDEPENDENT_AMBULATORY_CARE_PROVIDER_SITE_OTHER): Payer: BLUE CROSS/BLUE SHIELD | Admitting: Obstetrics & Gynecology

## 2016-06-08 VITALS — BP 110/80 | HR 80 | Resp 16 | Ht 66.0 in | Wt 190.0 lb

## 2016-06-08 DIAGNOSIS — N8184 Pelvic muscle wasting: Secondary | ICD-10-CM

## 2016-06-08 DIAGNOSIS — M6289 Other specified disorders of muscle: Secondary | ICD-10-CM

## 2016-06-08 DIAGNOSIS — R102 Pelvic and perineal pain: Secondary | ICD-10-CM | POA: Diagnosis not present

## 2016-06-08 MED ORDER — CYCLOBENZAPRINE HCL 10 MG PO TABS: 10.0000 mg | ORAL_TABLET | Freq: Every day | ORAL | 0 refills | Status: DC

## 2016-06-08 NOTE — Progress Notes (Signed)
GYNECOLOGY  VISIT   HPI: 40 y.o. G61P0000 Married Caucasian female here for follow up after being seen last week by Dr Edward Jolly for worsening pelvic pain.  Pt is 25 days post op from TLH/bilateral salpingectomy/cystoscopy.  Reports that she had hip to hip pain that was across the pelvis and shooting vaginal pains.  This is like what like what she would experience with the onset of her cycle.  Overall, this is much better.    Pt reports that she'd finally gotten her BMs back to normal but the Percocet last week made her constipation much more severe.  She is taking colace twice daily, fiber and probiotics.  Does have hx of long standing constipation.    Having minimal spotting.  Bladder function feels normal.  Feels like her energy is improving but she does not have her normal stamina at this point.  Still reports pelvic pain that is dull and aching in nature.  As well, there are sharp shooting pelvic pains.  This is what she's experienced during intercourse.  Would really like to get to the bottom of this.   Reviewed with pt that she has no evidence of significant post op complication/injury/infection.     Patient Active Problem List   Diagnosis Date Noted  . Endometriosis 04/25/2016  . PCOS (polycystic ovarian syndrome) 03/19/2016  . Pelvic pain in female 03/19/2016  . Epilepsy with partial complex seizures (HCC) 01/21/2016  . Idiopathic generalized epilepsy (HCC) 12/06/2015  . Migraine without aura and responsive to treatment 12/06/2015    Past Medical History:  Diagnosis Date  . Anxiety   . Depression   . Endometriosis   . Epilepsy (HCC)    last seizure - years ago  . GERD (gastroesophageal reflux disease)   . Headache   . History of migraine headaches   . Seizures (HCC)    controlled with meds, last seizure 4-5 yrs ago    Past Surgical History:  Procedure Laterality Date  . CARPAL TUNNEL RELEASE Right 06/14/2015   Procedure: RIGHT CARPAL TUNNEL RELEASE ENDOSCOPIC;  Surgeon:  Mack Hook, MD;  Location: [facility];  Service: Orthopedics;  Laterality: Right;  . COLONOSCOPY WITH PROPOFOL N/A 02/10/2013   Procedure: COLONOSCOPY WITH PROPOFOL;  Surgeon: Charolett Bumpers, MD;  Location: [facility];  Service: Endoscopy;  Laterality: N/A;  . CYSTOSCOPY N/A 05/14/2016   Procedure: CYSTOSCOPY;  Surgeon: Jerene Bears, MD;  Location: [facility];  Service: Gynecology;  Laterality: N/A;  . DIAGNOSTIC LAPAROSCOPY  12/15/2009   uterosacral nerve ablation  . ELBOW SURGERY Left 2012  . LAPAROSCOPIC BILATERAL SALPINGECTOMY Bilateral 05/14/2016   Procedure: LAPAROSCOPIC BILATERAL SALPINGECTOMY;  Surgeon: Jerene Bears, MD;  Location: [facility];  Service: Gynecology;  Laterality: Bilateral;  . LAPAROSCOPIC HYSTERECTOMY N/A 05/14/2016   Procedure: HYSTERECTOMY TOTAL LAPAROSCOPIC;  Surgeon: Jerene Bears, MD;  Location: [facility];  Service: Gynecology;  Laterality: N/A;  . UMBILICAL HERNIA REPAIR     as an infant  . WISDOM TOOTH EXTRACTION    . YAG LASER APPLICATION  06/24/2006   vaporization of endometriosis    MEDS:  Reviewed in EPIC and UTD  ALLERGIES: Wellbutrin [bupropion hcl]; Escitalopram oxalate; and Topiramate  Family History  Problem Relation Age of Onset  . Hypertension Mother   . Diabetes Mother   . Hypertension Maternal Grandmother   . Diabetes Maternal Grandmother   . Other Father     bile duct cancer  . Cancer Father     SH:  Married, non smoker  Review of Systems  Gastrointestinal: Positive for constipation.  All other systems reviewed and are negative.   PHYSICAL EXAMINATION:    BP 110/80 (BP Location: Right Arm, Patient Position: Sitting, Cuff Size: Normal)   Pulse 80   Resp 16   Ht 5\' 6"  (1.676 m)   Wt 190 lb (86.2 kg)   LMP 04/27/2016 (Approximate)   BMI 30.67 kg/m     General appearance: alert, cooperative and appears stated age CV:  Regular rate and rhythm Lungs:  clear to auscultation, no wheezes, rales or rhonchi, symmetric air  entry Abdomen: soft, non-tender; bowel sounds normal; no masses,  no organomegaly Inc: C/D/I  Pelvic: External genitalia:  no lesions              Urethra:  normal appearing urethra with no masses, tenderness or lesions              Bartholins and Skenes: normal                 Vagina: normal appearing vagina with normal color and discharge, cuff healing well, no masses or fullness              Cervix: absent              Bimanual Exam:  Uterus:  uterus absent              Adnexa: no mass, fullness, tenderness              Pt does have significant tenderness with palpation of pelvic floor.  In fact, this reproduces her exact pain.  Chaperone was present for exam.  Assessment: 25 days post op from TLH/bilateral salpingectomy, healing well Pelvic floor pain H/O dyspareunia  Plan: Will recheck 2-3 weeks to assess return to work Referral to PT for assistance with pelvic floor pain issues   ~20 minutes spent with patient >50% of time was in face to face discussion of above findings, PT referral, and possible additional treatment.

## 2016-06-12 ENCOUNTER — Encounter: Payer: Self-pay | Admitting: Obstetrics & Gynecology

## 2016-06-15 ENCOUNTER — Ambulatory Visit: Payer: BLUE CROSS/BLUE SHIELD | Admitting: Obstetrics & Gynecology

## 2016-06-18 ENCOUNTER — Telehealth: Payer: Self-pay | Admitting: Obstetrics & Gynecology

## 2016-06-18 NOTE — Telephone Encounter (Signed)
Call to patient. Advised Dr Hyacinth MeekerMiller has ordered. Will follow-up on appointment and have someone call her back from referral department with update.

## 2016-06-18 NOTE — Telephone Encounter (Signed)
Patient says she is still waiting on a referral for physical therapy.

## 2016-06-20 DIAGNOSIS — M62838 Other muscle spasm: Secondary | ICD-10-CM | POA: Diagnosis not present

## 2016-06-20 DIAGNOSIS — M6281 Muscle weakness (generalized): Secondary | ICD-10-CM | POA: Diagnosis not present

## 2016-06-20 DIAGNOSIS — R102 Pelvic and perineal pain: Secondary | ICD-10-CM | POA: Diagnosis not present

## 2016-06-20 DIAGNOSIS — R278 Other lack of coordination: Secondary | ICD-10-CM | POA: Diagnosis not present

## 2016-06-22 ENCOUNTER — Encounter: Payer: Self-pay | Admitting: Obstetrics & Gynecology

## 2016-06-22 ENCOUNTER — Ambulatory Visit (INDEPENDENT_AMBULATORY_CARE_PROVIDER_SITE_OTHER): Payer: BLUE CROSS/BLUE SHIELD | Admitting: Obstetrics & Gynecology

## 2016-06-22 VITALS — BP 112/60 | HR 84 | Resp 16 | Ht 66.0 in | Wt 191.0 lb

## 2016-06-22 DIAGNOSIS — Z9889 Other specified postprocedural states: Secondary | ICD-10-CM

## 2016-06-22 NOTE — Progress Notes (Signed)
Post Operative Visit  Procedure:Hysterectomy  Surgery date:  05/14/16  Subjective: Doing better.  She is going to return to work on Monday.  She is seeing Ruben GottronWilda Young for pelvic PT.  Pt has gone to one week.  She felt like she learned a lot from the initial visit.  She does need a return to work note.  Feels energy is getting better.  No vaginal bleeding.  Normal bladder function.  Continues with fiber, probiotic, and colace.    Objective: LMP 04/27/2016 (Approximate)   EXAM General: alert and cooperative Resp: clear to auscultation bilaterally Cardio: regular rate and rhythm GI: soft, non-tender; bowel sounds normal; no masses,  no organomegaly and incision: clean, dry and intact Extremities: extremities normal, atraumatic, no cyanosis or edema Vaginal Bleeding: none, cuff healing well without drainage, erythema or masses  Assessment: s/p TLH/bilateral salpingectomy  Plan: Recheck 2-3 months

## 2016-06-26 NOTE — Telephone Encounter (Signed)
See office visit note from 06-22-16. Patient has started pelvic PT with Ruben GottronWilda Young.  Routing to provider for final review. Patient agreeable to disposition. Will close encounter.

## 2016-06-27 DIAGNOSIS — M6281 Muscle weakness (generalized): Secondary | ICD-10-CM | POA: Diagnosis not present

## 2016-06-27 DIAGNOSIS — R278 Other lack of coordination: Secondary | ICD-10-CM | POA: Diagnosis not present

## 2016-06-27 DIAGNOSIS — K5900 Constipation, unspecified: Secondary | ICD-10-CM | POA: Diagnosis not present

## 2016-06-27 DIAGNOSIS — M62838 Other muscle spasm: Secondary | ICD-10-CM | POA: Diagnosis not present

## 2016-07-04 DIAGNOSIS — M6281 Muscle weakness (generalized): Secondary | ICD-10-CM | POA: Diagnosis not present

## 2016-07-04 DIAGNOSIS — M62838 Other muscle spasm: Secondary | ICD-10-CM | POA: Diagnosis not present

## 2016-07-04 DIAGNOSIS — R278 Other lack of coordination: Secondary | ICD-10-CM | POA: Diagnosis not present

## 2016-07-04 DIAGNOSIS — K5900 Constipation, unspecified: Secondary | ICD-10-CM | POA: Diagnosis not present

## 2016-07-05 DIAGNOSIS — G40209 Localization-related (focal) (partial) symptomatic epilepsy and epileptic syndromes with complex partial seizures, not intractable, without status epilepticus: Secondary | ICD-10-CM | POA: Diagnosis not present

## 2016-07-05 DIAGNOSIS — G43009 Migraine without aura, not intractable, without status migrainosus: Secondary | ICD-10-CM | POA: Diagnosis not present

## 2016-07-10 ENCOUNTER — Ambulatory Visit: Payer: BLUE CROSS/BLUE SHIELD | Admitting: Certified Nurse Midwife

## 2016-07-12 DIAGNOSIS — M6281 Muscle weakness (generalized): Secondary | ICD-10-CM | POA: Diagnosis not present

## 2016-07-12 DIAGNOSIS — R278 Other lack of coordination: Secondary | ICD-10-CM | POA: Diagnosis not present

## 2016-07-12 DIAGNOSIS — K5900 Constipation, unspecified: Secondary | ICD-10-CM | POA: Diagnosis not present

## 2016-07-12 DIAGNOSIS — M62838 Other muscle spasm: Secondary | ICD-10-CM | POA: Diagnosis not present

## 2016-07-17 DIAGNOSIS — M62838 Other muscle spasm: Secondary | ICD-10-CM | POA: Diagnosis not present

## 2016-07-17 DIAGNOSIS — M6281 Muscle weakness (generalized): Secondary | ICD-10-CM | POA: Diagnosis not present

## 2016-07-17 DIAGNOSIS — K5900 Constipation, unspecified: Secondary | ICD-10-CM | POA: Diagnosis not present

## 2016-07-17 DIAGNOSIS — R278 Other lack of coordination: Secondary | ICD-10-CM | POA: Diagnosis not present

## 2016-07-23 DIAGNOSIS — K5900 Constipation, unspecified: Secondary | ICD-10-CM | POA: Diagnosis not present

## 2016-07-23 DIAGNOSIS — M62838 Other muscle spasm: Secondary | ICD-10-CM | POA: Diagnosis not present

## 2016-07-23 DIAGNOSIS — M6281 Muscle weakness (generalized): Secondary | ICD-10-CM | POA: Diagnosis not present

## 2016-07-23 DIAGNOSIS — R278 Other lack of coordination: Secondary | ICD-10-CM | POA: Diagnosis not present

## 2016-07-26 DIAGNOSIS — Z23 Encounter for immunization: Secondary | ICD-10-CM | POA: Diagnosis not present

## 2016-07-26 DIAGNOSIS — K5900 Constipation, unspecified: Secondary | ICD-10-CM | POA: Diagnosis not present

## 2016-07-26 DIAGNOSIS — Z0000 Encounter for general adult medical examination without abnormal findings: Secondary | ICD-10-CM | POA: Diagnosis not present

## 2016-08-01 ENCOUNTER — Ambulatory Visit (INDEPENDENT_AMBULATORY_CARE_PROVIDER_SITE_OTHER): Payer: BLUE CROSS/BLUE SHIELD | Admitting: Obstetrics and Gynecology

## 2016-08-01 ENCOUNTER — Encounter: Payer: Self-pay | Admitting: Obstetrics and Gynecology

## 2016-08-01 VITALS — BP 110/78 | HR 64 | Resp 13 | Wt 199.0 lb

## 2016-08-01 DIAGNOSIS — K5900 Constipation, unspecified: Secondary | ICD-10-CM | POA: Diagnosis not present

## 2016-08-01 DIAGNOSIS — L293 Anogenital pruritus, unspecified: Secondary | ICD-10-CM | POA: Diagnosis not present

## 2016-08-01 DIAGNOSIS — M62838 Other muscle spasm: Secondary | ICD-10-CM | POA: Diagnosis not present

## 2016-08-01 DIAGNOSIS — R3915 Urgency of urination: Secondary | ICD-10-CM

## 2016-08-01 DIAGNOSIS — M6281 Muscle weakness (generalized): Secondary | ICD-10-CM | POA: Diagnosis not present

## 2016-08-01 DIAGNOSIS — R350 Frequency of micturition: Secondary | ICD-10-CM

## 2016-08-01 DIAGNOSIS — N3001 Acute cystitis with hematuria: Secondary | ICD-10-CM

## 2016-08-01 DIAGNOSIS — R278 Other lack of coordination: Secondary | ICD-10-CM | POA: Diagnosis not present

## 2016-08-01 LAB — POCT URINALYSIS DIPSTICK
BILIRUBIN UA: NEGATIVE
Glucose, UA: NEGATIVE
KETONES UA: NEGATIVE
NITRITE UA: NEGATIVE
Protein, UA: NEGATIVE
Urobilinogen, UA: NEGATIVE
pH, UA: 6.5

## 2016-08-01 MED ORDER — PHENAZOPYRIDINE HCL 200 MG PO TABS: ORAL_TABLET | ORAL | 0 refills | Status: DC

## 2016-08-01 MED ORDER — SULFAMETHOXAZOLE-TRIMETHOPRIM 800-160 MG PO TABS: 1.0000 | ORAL_TABLET | Freq: Two times a day (BID) | ORAL | 0 refills | Status: DC

## 2016-08-01 NOTE — Patient Instructions (Signed)

## 2016-08-01 NOTE — Progress Notes (Signed)
GYNECOLOGY  VISIT   HPI: 40 y.o.   Married  Caucasian  female   G0P0000 with Patient's last menstrual period was 04/27/2016 (approximate).   here c/o moderate urinary frequency and urgency x 6 days. Urine has an odor. Voiding normal amounts, needing to double void to empty. This morning she is have very slight burning at the end of voiding. She is also c/o vaginal itching starting this morning, mild. No abnormal d/c, no burning or irritation.     No fevers, no back pain. The patient is s/p TLH/BS in 7/17.  She is sexually active.   GYNECOLOGIC HISTORY: Patient's last menstrual period was 04/27/2016 (approximate). Contraception:hysterectomy  Menopausal hormone therapy: none        OB History    Gravida Para Term Preterm AB Living   0 0 0 0 0 0   SAB TAB Ectopic Multiple Live Births   0 0 0 0           Patient Active Problem List   Diagnosis Date Noted  . Endometriosis 04/25/2016  . PCOS (polycystic ovarian syndrome) 03/19/2016  . Pelvic pain in female 03/19/2016  . Epilepsy with partial complex seizures (HCC) 01/21/2016  . Idiopathic generalized epilepsy (HCC) 12/06/2015  . Migraine without aura and responsive to treatment 12/06/2015    Past Medical History:  Diagnosis Date  . Anxiety   . Depression   . Endometriosis   . Epilepsy (HCC)    last seizure - years ago  . GERD (gastroesophageal reflux disease)   . Headache   . History of migraine headaches   . Seizures (HCC)    controlled with meds, last seizure 4-5 yrs ago    Past Surgical History:  Procedure Laterality Date  . CARPAL TUNNEL RELEASE Right 06/14/2015   Procedure: RIGHT CARPAL TUNNEL RELEASE ENDOSCOPIC;  Surgeon: Mack Hookavid Thompson, MD;  Location: [facility];  Service: Orthopedics;  Laterality: Right;  . COLONOSCOPY WITH PROPOFOL N/A 02/10/2013   Procedure: COLONOSCOPY WITH PROPOFOL;  Surgeon: Charolett BumpersMartin K Johnson, MD;  Location: [facility];  Service: Endoscopy;  Laterality: N/A;  . CYSTOSCOPY N/A  05/14/2016   Procedure: CYSTOSCOPY;  Surgeon: Jerene BearsMary S Miller, MD;  Location: [facility];  Service: Gynecology;  Laterality: N/A;  . DIAGNOSTIC LAPAROSCOPY  12/15/2009   uterosacral nerve ablation  . ELBOW SURGERY Left 2012  . LAPAROSCOPIC BILATERAL SALPINGECTOMY Bilateral 05/14/2016   Procedure: LAPAROSCOPIC BILATERAL SALPINGECTOMY;  Surgeon: Jerene BearsMary S Miller, MD;  Location: [facility];  Service: Gynecology;  Laterality: Bilateral;  . LAPAROSCOPIC HYSTERECTOMY N/A 05/14/2016   Procedure: HYSTERECTOMY TOTAL LAPAROSCOPIC;  Surgeon: Jerene BearsMary S Miller, MD;  Location: [facility];  Service: Gynecology;  Laterality: N/A;  . UMBILICAL HERNIA REPAIR     as an infant  . WISDOM TOOTH EXTRACTION    . YAG LASER APPLICATION  06/24/2006   vaporization of endometriosis    Current Outpatient Prescriptions  Medication Sig Dispense Refill  . amitriptyline (ELAVIL) 50 MG tablet Take 100 mg by mouth. Takes 2 at bedtime    . cholecalciferol (VITAMIN D) 1000 UNITS tablet Take 1,000 Units by mouth daily.    . clonazePAM (KLONOPIN) 0.5 MG tablet Take 0.5 mg by mouth as needed (as needed for seizures).     . FIBER PO Take 1 packet by mouth daily.     Marland Kitchen. ibuprofen (ADVIL,MOTRIN) 800 MG tablet TAKE 1 TABLET BY MOUTH EVERY 8 HOURS AS NEEDED 30 tablet 1  . Lactobacillus (PROBIOTIC ACIDOPHILUS PO) Take by mouth daily.    .Marland Kitchen  lamoTRIgine (LAMICTAL) 100 MG tablet Take 200 mg by mouth 2 (two) times daily.     Marland Kitchen levETIRAcetam (KEPPRA) 1000 MG tablet TAKE ONE-HALF TABLET BY MOUTH EVERY MORNING & ONE TABLET BY MOUTH AT BEDTIME  0  . loratadine (CLARITIN) 10 MG tablet Take 10 mg by mouth as needed for allergies. Reported on 12/13/2015    . Multiple Vitamins-Minerals (MULTIVITAMIN PO) Take 1 tablet by mouth daily.     . ranitidine (ZANTAC) 150 MG tablet Take 150 mg by mouth as needed for heartburn.     Marland Kitchen LINZESS 145 MCG CAPS capsule      No current facility-administered medications for this visit.      ALLERGIES: Wellbutrin [bupropion hcl];  Escitalopram oxalate; and Topiramate  Family History  Problem Relation Age of Onset  . Hypertension Mother   . Diabetes Mother   . Hypertension Maternal Grandmother   . Diabetes Maternal Grandmother   . Other Father     bile duct cancer  . Cancer Father     Social History   Social History  . Marital status: Married    Spouse name: N/A  . Number of children: N/A  . Years of education: N/A   Occupational History  . Not on file.   Social History Main Topics  . Smoking status: Never Smoker  . Smokeless tobacco: Never Used  . Alcohol use 3.6 oz/week    4 Glasses of wine, 2 Standard drinks or equivalent per week  . Drug use: No  . Sexual activity: Not Currently    Partners: Male    Birth control/ protection: Surgical     Comment: husband vasectomy   Other Topics Concern  . Not on file   Social History Narrative  . No narrative on file    Review of Systems  Constitutional: Negative.   HENT: Negative.   Eyes: Negative.   Respiratory: Negative.   Cardiovascular: Negative.   Gastrointestinal: Negative.   Genitourinary: Positive for frequency and urgency.       Vaginal itching  Musculoskeletal: Negative.   Skin: Negative.   Neurological: Negative.   Endo/Heme/Allergies: Negative.   Psychiatric/Behavioral: Negative.     PHYSICAL EXAMINATION:    BP 110/78 (BP Location: Right Arm, Patient Position: Sitting, Cuff Size: Normal)   Pulse 64   Resp 13   Wt 199 lb (90.3 kg)   LMP 04/27/2016 (Approximate)   BMI 32.12 kg/m     General appearance: alert, cooperative and appears stated age Abdomen: soft, non-tender; bowel sounds normal; no masses,  no organomegaly CVA: not tender  Pelvic: External genitalia:  no lesions              Urethra:  normal appearing urethra with no masses, tenderness or lesions              Bartholins and Skenes: normal                 Vagina: normal appearing vagina with normal color and discharge, no lesions              Cervix: absent,  cuff well healed  Chaperone was present for exam.  Wet prep: no clue, no trich, + wbc KOH: no yeast PH: 5  ASSESSMENT UTI Genital pruritus, negative vaginal slides (elevated pH)    PLAN Treat with Bactrim DS Urine for ua, c&s Wet prep probe Vaseline externally, further treatment depending on lab results   An After Visit Summary was printed and given to the  patient.

## 2016-08-02 LAB — WET PREP BY MOLECULAR PROBE
CANDIDA SPECIES: NEGATIVE
GARDNERELLA VAGINALIS: NEGATIVE
TRICHOMONAS VAG: NEGATIVE

## 2016-08-02 LAB — URINALYSIS, MICROSCOPIC ONLY
CASTS: NONE SEEN [LPF]
Crystals: NONE SEEN [HPF]
YEAST: NONE SEEN [HPF]

## 2016-08-03 LAB — URINE CULTURE

## 2016-08-13 DIAGNOSIS — M62838 Other muscle spasm: Secondary | ICD-10-CM | POA: Diagnosis not present

## 2016-08-13 DIAGNOSIS — R278 Other lack of coordination: Secondary | ICD-10-CM | POA: Diagnosis not present

## 2016-08-13 DIAGNOSIS — K5900 Constipation, unspecified: Secondary | ICD-10-CM | POA: Diagnosis not present

## 2016-08-13 DIAGNOSIS — M6281 Muscle weakness (generalized): Secondary | ICD-10-CM | POA: Diagnosis not present

## 2016-08-21 DIAGNOSIS — M62838 Other muscle spasm: Secondary | ICD-10-CM | POA: Diagnosis not present

## 2016-08-21 DIAGNOSIS — K5900 Constipation, unspecified: Secondary | ICD-10-CM | POA: Diagnosis not present

## 2016-08-21 DIAGNOSIS — R278 Other lack of coordination: Secondary | ICD-10-CM | POA: Diagnosis not present

## 2016-08-21 DIAGNOSIS — M6281 Muscle weakness (generalized): Secondary | ICD-10-CM | POA: Diagnosis not present

## 2016-08-27 DIAGNOSIS — K5900 Constipation, unspecified: Secondary | ICD-10-CM | POA: Diagnosis not present

## 2016-08-27 DIAGNOSIS — M62838 Other muscle spasm: Secondary | ICD-10-CM | POA: Diagnosis not present

## 2016-08-27 DIAGNOSIS — M6281 Muscle weakness (generalized): Secondary | ICD-10-CM | POA: Diagnosis not present

## 2016-08-27 DIAGNOSIS — R278 Other lack of coordination: Secondary | ICD-10-CM | POA: Diagnosis not present

## 2016-08-30 ENCOUNTER — Encounter: Payer: Self-pay | Admitting: Obstetrics & Gynecology

## 2016-08-30 ENCOUNTER — Ambulatory Visit (INDEPENDENT_AMBULATORY_CARE_PROVIDER_SITE_OTHER): Payer: BLUE CROSS/BLUE SHIELD | Admitting: Obstetrics & Gynecology

## 2016-08-30 VITALS — BP 110/70 | HR 76 | Resp 16 | Ht 66.0 in | Wt 200.0 lb

## 2016-08-30 DIAGNOSIS — L298 Other pruritus: Secondary | ICD-10-CM

## 2016-08-30 DIAGNOSIS — N898 Other specified noninflammatory disorders of vagina: Secondary | ICD-10-CM

## 2016-08-30 DIAGNOSIS — M6289 Other specified disorders of muscle: Secondary | ICD-10-CM

## 2016-08-30 NOTE — Progress Notes (Signed)
GYNECOLOGY  VISIT   HPI: 40 y.o. [id number] Married Caucasian female with here for follow-up after having continued pelvic pain after having hysterectomy.  Pelvic floor dysfunction was diagnosed.  She has been going to PT and this is still ongoing.  Has appointment with Amalia GreenhouseWilda next.  No bladder issues.  Does has constipation issues.  Was started on Linzess and feels this has helped but she's only on it since Sunday.  Overall, feels her pain is much decreased.  Intercourse is better as well.  Very happy with progress at this point.    Pt reports some increased discharge with minimal itching.  Still figuring out how to know when she has "PMS" as she is not having much breast tenderness and not cycling.  Has considered keeping a calendar.    GYNECOLOGIC HISTORY: Patient's last menstrual period was 04/27/2016 (approximate). Contraception: hysterectomy  Patient Active Problem List   Diagnosis Date Noted  . Endometriosis 04/25/2016  . PCOS (polycystic ovarian syndrome) 03/19/2016  . Pelvic pain in female 03/19/2016  . Epilepsy with partial complex seizures (HCC) 01/21/2016  . Idiopathic generalized epilepsy (HCC) 12/06/2015  . Migraine without aura and responsive to treatment 12/06/2015    Past Medical History:  Diagnosis Date  . Anxiety   . Depression   . Endometriosis   . Epilepsy (HCC)    last seizure - years ago  . GERD (gastroesophageal reflux disease)   . Headache   . History of migraine headaches   . Seizures (HCC)    controlled with meds, last seizure 4-5 yrs ago    Past Surgical History:  Procedure Laterality Date  . CARPAL TUNNEL RELEASE Right 06/14/2015   Procedure: RIGHT CARPAL TUNNEL RELEASE ENDOSCOPIC;  Surgeon: Mack Hookavid Thompson, MD;  Location: [facility];  Service: Orthopedics;  Laterality: Right;  . COLONOSCOPY WITH PROPOFOL N/A 02/10/2013   Procedure: COLONOSCOPY WITH PROPOFOL;  Surgeon: Charolett BumpersMartin K Johnson, MD;  Location: [facility];  Service: Endoscopy;   Laterality: N/A;  . CYSTOSCOPY N/A 05/14/2016   Procedure: CYSTOSCOPY;  Surgeon: Jerene BearsMary S Jesse Nosbisch, MD;  Location: [facility];  Service: Gynecology;  Laterality: N/A;  . DIAGNOSTIC LAPAROSCOPY  12/15/2009   uterosacral nerve ablation  . ELBOW SURGERY Left 2012  . LAPAROSCOPIC BILATERAL SALPINGECTOMY Bilateral 05/14/2016   Procedure: LAPAROSCOPIC BILATERAL SALPINGECTOMY;  Surgeon: Jerene BearsMary S Blondina Coderre, MD;  Location: [facility];  Service: Gynecology;  Laterality: Bilateral;  . LAPAROSCOPIC HYSTERECTOMY N/A 05/14/2016   Procedure: HYSTERECTOMY TOTAL LAPAROSCOPIC;  Surgeon: Jerene BearsMary S Ivadell Gaul, MD;  Location: [facility];  Service: Gynecology;  Laterality: N/A;  . UMBILICAL HERNIA REPAIR     as an infant  . WISDOM TOOTH EXTRACTION    . YAG LASER APPLICATION  06/24/2006   vaporization of endometriosis    MEDS:  Reviewed in EPIC and UTD  ALLERGIES: Wellbutrin [bupropion hcl]; Escitalopram oxalate; and Topiramate  Family History  Problem Relation Age of Onset  . Hypertension Mother   . Diabetes Mother   . Hypertension Maternal Grandmother   . Diabetes Maternal Grandmother   . Other Father     bile duct cancer  . Cancer Father    SH:  Married, smoke  Review of Systems  All other systems reviewed and are negative.   PHYSICAL EXAMINATION:    BP 110/70 (BP Location: Right Arm, Patient Position: Sitting, Cuff Size: Normal)   Pulse 76   Resp 16   Ht 5\' 6"  (1.676 m)   Wt 200 lb (90.7 kg)   LMP 04/27/2016 (  Approximate)   BMI 32.28 kg/m     General appearance: alert, cooperative and appears stated age Abdomen: soft, non-tender; bowel sounds normal; no masses,  no organomegaly  Pelvic: External genitalia:  no lesions              Urethra:  normal appearing urethra with no masses, tenderness or lesions              Bartholins and Skenes: normal                 Vagina: normal appearing vagina with normal color.  Whitish discharge present.  No lesions              Cervix: absent              Bimanual Exam:  Uterus:   uterus absent, palpation of pelvic floor showed significantly decreased tenderness, still more pain L vs R              Adnexa: no mass, fullness, tenderness              Anus:  normal sphincter tone, no lesions  Chaperone was present for exam.  Assessment: Pelvic floor dysfunction, improved Vaginal discharge and itching  Plan: Affirm pending. Seeing Amalia GreenhouseWilda next week.  Will continue PT hopefully. Follow up 1 year is scheduled.

## 2016-08-31 LAB — WET PREP BY MOLECULAR PROBE
Candida species: NEGATIVE
Gardnerella vaginalis: POSITIVE — AB
Trichomonas vaginosis: NEGATIVE

## 2016-09-04 ENCOUNTER — Telehealth: Payer: Self-pay | Admitting: *Deleted

## 2016-09-04 DIAGNOSIS — K5900 Constipation, unspecified: Secondary | ICD-10-CM | POA: Diagnosis not present

## 2016-09-04 DIAGNOSIS — R278 Other lack of coordination: Secondary | ICD-10-CM | POA: Diagnosis not present

## 2016-09-04 DIAGNOSIS — M6281 Muscle weakness (generalized): Secondary | ICD-10-CM | POA: Diagnosis not present

## 2016-09-04 DIAGNOSIS — M62838 Other muscle spasm: Secondary | ICD-10-CM | POA: Diagnosis not present

## 2016-09-04 MED ORDER — METRONIDAZOLE 0.75 % VA GEL: VAGINAL | 0 refills | Status: DC

## 2016-09-04 NOTE — Telephone Encounter (Signed)
Patient returned call. Notified of affirm results. Patient verbalized understanding. Verified pharmacy on file and prescription for metrogel sent in. Patient instructed on use of metrogel and verbalized understanding.   Routing to provider for final review. Patient agreeable to disposition. Will close encounter.

## 2016-09-04 NOTE — Telephone Encounter (Signed)
Message left to return call to Irving Burtonmily at [phone] to review affirm results and verify pharmacy for medication to be sent to.

## 2016-09-04 NOTE — Telephone Encounter (Signed)
-----   Message from Jerene BearsMary S Miller, MD sent at 08/31/2016  4:24 PM EDT ----- Please inform pt that the affirm showed BV.  Ok to treat with metrogel 0.75%.  One applicator full qhs x 5 nights.

## 2016-11-20 DIAGNOSIS — N941 Unspecified dyspareunia: Secondary | ICD-10-CM | POA: Diagnosis not present

## 2016-11-20 DIAGNOSIS — K5900 Constipation, unspecified: Secondary | ICD-10-CM | POA: Diagnosis not present

## 2016-11-20 DIAGNOSIS — M62838 Other muscle spasm: Secondary | ICD-10-CM | POA: Diagnosis not present

## 2016-11-20 DIAGNOSIS — M6281 Muscle weakness (generalized): Secondary | ICD-10-CM | POA: Diagnosis not present

## 2016-11-21 ENCOUNTER — Encounter: Payer: Self-pay | Admitting: Physician Assistant

## 2016-11-21 ENCOUNTER — Encounter: Payer: Self-pay | Admitting: Gastroenterology

## 2016-11-29 ENCOUNTER — Encounter: Payer: Self-pay | Admitting: Physician Assistant

## 2016-11-29 ENCOUNTER — Ambulatory Visit (INDEPENDENT_AMBULATORY_CARE_PROVIDER_SITE_OTHER): Payer: BLUE CROSS/BLUE SHIELD | Admitting: Physician Assistant

## 2016-11-29 VITALS — BP 100/60 | HR 72 | Ht 66.0 in | Wt 210.0 lb

## 2016-11-29 DIAGNOSIS — K5909 Other constipation: Secondary | ICD-10-CM

## 2016-11-29 MED ORDER — LINACLOTIDE 290 MCG PO CAPS: 290.0000 ug | ORAL_CAPSULE | Freq: Every day | ORAL | 3 refills | Status: DC

## 2016-11-29 MED ORDER — NA SULFATE-K SULFATE-MG SULF 17.5-3.13-1.6 GM/177ML PO SOLN: 1.0000 | Freq: Once | ORAL | 0 refills | Status: AC

## 2016-11-29 MED ORDER — LINACLOTIDE 290 MCG PO CAPS: 290.0000 ug | ORAL_CAPSULE | Freq: Every day | ORAL | 11 refills | Status: DC

## 2016-11-29 NOTE — Progress Notes (Signed)
Subjective:    Patient ID: April Campbell, female    DOB: 10-10-1976, 41 y.o.   MRN: [id number]  HPI [name] is a pleasant 41 y.o. white female, new to GI today, referred by [facility] or evaluation of chronic constipation. Patient states that she was seen by Dr. Howell Rucks a couple of years ago with similar complaints and did undergo colonoscopy. We obtained a copy of that report and colonoscopy was done in April 2014 and was a normal exam. Sign Patient says she has had constipation problems dating back to her teenage years. She feels that her constipation has worsened over the past year and more acutely over the past month. She says she will frequently go at least a week between bowel movements and at this time really hasn't had much for bowel movement over the past 2 weeks. He says when she gets very constipated she gets abdominal discomfort, sometimes nausea and "sick feeling". She also developed some left lower quadrant discomfort and has not noted any melena or hematochezia. She has tried MiraLAX without any benefit. She was started on Linzess by her PCP 145 g once daily several weeks ago. She says this worked well for a few weeks and then sort of stopped helping.  She also has a diagnosis of pelvic floor dysfunction post hysterectomy and has been undergoing physical therapy which she says has been helpful.   TSH checked in April 2017 within normal limits.  Review of Systems Pertinent positive and negative review of systems were noted in the above HPI section.  All other review of systems was otherwise negative.  Outpatient Encounter Prescriptions as of 11/29/2016  Medication Sig  . amitriptyline (ELAVIL) 50 MG tablet Take 100 mg by mouth. Takes 2 at bedtime  . cholecalciferol (VITAMIN D) 1000 UNITS tablet Take 1,000 Units by mouth daily.  . clonazePAM (KLONOPIN) 0.5 MG tablet Take 0.5 mg by mouth as needed (as needed for seizures).   . FIBER PO Take 1 packet by mouth  daily.   Marland Kitchen ibuprofen (ADVIL,MOTRIN) 800 MG tablet TAKE 1 TABLET BY MOUTH EVERY 8 HOURS AS NEEDED  . Lactobacillus (PROBIOTIC ACIDOPHILUS PO) Take by mouth daily.  Marland Kitchen lamoTRIgine (LAMICTAL) 100 MG tablet Take 200 mg by mouth 2 (two) times daily.   Marland Kitchen levETIRAcetam (KEPPRA) 1000 MG tablet TAKE ONE-HALF TABLET BY MOUTH EVERY MORNING & ONE TABLET BY MOUTH AT BEDTIME  . LINZESS 145 MCG CAPS capsule Take 145 mcg by mouth as needed.   . loratadine (CLARITIN) 10 MG tablet Take 10 mg by mouth as needed for allergies. Reported on 12/13/2015  . Multiple Vitamins-Minerals (MULTIVITAMIN PO) Take 1 tablet by mouth daily.   . Psyllium (METAMUCIL FIBER PO) Take by mouth. 1 teaspoon daily  . ranitidine (ZANTAC) 150 MG tablet Take 150 mg by mouth as needed for heartburn.   . linaclotide (LINZESS) 290 MCG CAPS capsule Take 1 capsule (290 mcg total) by mouth daily before breakfast.  . Na Sulfate-K Sulfate-Mg Sulf 17.5-3.13-1.6 GM/180ML SOLN Take 1 kit by mouth once.  . [DISCONTINUED] linaclotide (LINZESS) 290 MCG CAPS capsule Take 1 capsule (290 mcg total) by mouth daily before breakfast.  . [DISCONTINUED] metroNIDAZOLE (METROGEL) 0.75 % vaginal gel Apply one applicator full vaginally every night at bedtime for 5 nights   No facility-administered encounter medications on file as of 11/29/2016.    Allergies  Allergen Reactions  . Wellbutrin [Bupropion Hcl] Other (See Comments)    SEIZURE  . Escitalopram Oxalate Other (See  Comments)    Decreased sex drive  . Topiramate Other (See Comments)    foggy   Patient Active Problem List   Diagnosis Date Noted  . Endometriosis 04/25/2016  . PCOS (polycystic ovarian syndrome) 03/19/2016  . Pelvic pain in female 03/19/2016  . Epilepsy with partial complex seizures ([place]) 01/21/2016  . Idiopathic generalized epilepsy ([place]) 12/06/2015  . Migraine without aura and responsive to treatment 12/06/2015   Social History   Social History  . Marital status: Married    Spouse  name: N/A  . Number of children: 0  . Years of education: N/A   Occupational History  . regulatory specialist in [place] History Main Topics  . Smoking status: Never Smoker  . Smokeless tobacco: Never Used  . Alcohol use 3.6 oz/week    4 Glasses of wine, 2 Standard drinks or equivalent per week     Comment: social  . Drug use: No  . Sexual activity: Yes    Partners: Male    Birth control/ protection: Surgical     Comment: husband vasectomy/ Hysterectomy   Other Topics Concern  . Not on file   Social History Narrative  . No narrative on file    Ms. Xiao's family history includes Diabetes in her maternal grandmother and mother; Hypertension in her maternal grandmother and mother; Irritable bowel syndrome in her maternal aunt and mother; Other in her father.      Objective:    Vitals:   11/29/16 0857  BP: 100/60  Pulse: 72    Physical Exam well-developed white female in no acute distress, blood pressure 100/60 pulse 72, BMI 33.8. HEENT nontraumatic normocephalic EOMI PERRLA sclera anicteric, Cardiovascular; regular rate and rhythm with S1-S2 no murmur or gallop Pulmonary ;clear bilaterally, Abdomen; soft, nontender nondistended bowel sounds are active palpable mass or hepatosplenomegaly, Rectal; exam not done, Extremities; no clubbing cyanosis or edema skin warm and dry, Neuropsych; mood and affect appropriate       Assessment & Plan:   #13 41 year old white female with chronic functional constipation. Normal colonoscopy 2014 This may be all too factor oral, inpatient on amitriptyline, Lamictal and Keppra. #2 pelvic floor dysfunction is post hysterectomy #3 seizure disorder #4 history PCOS  Plan; we will increase Linzess to 290 mcg by mouth daily   She will continue Metamucil and liberal water intake daily As she has not had a bowel movement in a couple of weeks will give her a bowel purge-with Su prep We also discussed probable contribution to her  constipation by meds, especially amitriptyline. She will discuss with her neurologist on upcoming visit regarding potentially weaning off. Patient will be established with Dr. Loletha Carrow, and will follow-up in the office with me on an as-needed basis.     S  PA-C 11/29/2016   Cc: Aretta Nip, MD   Thank you for sending this case to me. I have reviewed the entire note, and the outlined plan seems appropriate.   Wilfrid Lund, MD

## 2016-11-29 NOTE — Patient Instructions (Addendum)
We sent a prescription to [facility] in [place] for a bowel purge kit.  Suprep.  We have given you a coupon to give to the pharmacy.   We also sent a prescription to your pharmacy for Linzess 290 mcg .  We have provided you with samples and a coupon to take to the pharmacy.  There are 2 doses to this kit.   Step 1: Pour ONE (1) 6-ounce bottle of SUPREP liquid into the mixing container. Step 2: Add cool drinking water to the 16-ounce line on the container and mix.   Step 3: Drink ALL of the 16-ounce diluted prep over 30 minutes. Step 4: You MUST drink an additional two (2) or more 16-ounce containers of water over the next one (1) hour.   Repeat for second dose.     

## 2016-12-03 DIAGNOSIS — R6889 Other general symptoms and signs: Secondary | ICD-10-CM | POA: Diagnosis not present

## 2016-12-14 DIAGNOSIS — F419 Anxiety disorder, unspecified: Secondary | ICD-10-CM | POA: Diagnosis not present

## 2016-12-14 DIAGNOSIS — G43009 Migraine without aura, not intractable, without status migrainosus: Secondary | ICD-10-CM | POA: Diagnosis not present

## 2016-12-14 DIAGNOSIS — G40309 Generalized idiopathic epilepsy and epileptic syndromes, not intractable, without status epilepticus: Secondary | ICD-10-CM | POA: Diagnosis not present

## 2016-12-14 DIAGNOSIS — G40319 Generalized idiopathic epilepsy and epileptic syndromes, intractable, without status epilepticus: Secondary | ICD-10-CM | POA: Diagnosis not present

## 2017-01-01 ENCOUNTER — Ambulatory Visit: Payer: BLUE CROSS/BLUE SHIELD | Admitting: Gastroenterology

## 2017-01-03 DIAGNOSIS — G43009 Migraine without aura, not intractable, without status migrainosus: Secondary | ICD-10-CM | POA: Diagnosis not present

## 2017-01-03 DIAGNOSIS — G40309 Generalized idiopathic epilepsy and epileptic syndromes, not intractable, without status epilepticus: Secondary | ICD-10-CM | POA: Diagnosis not present

## 2017-01-03 DIAGNOSIS — F419 Anxiety disorder, unspecified: Secondary | ICD-10-CM | POA: Diagnosis not present

## 2017-03-28 DIAGNOSIS — G43009 Migraine without aura, not intractable, without status migrainosus: Secondary | ICD-10-CM | POA: Diagnosis not present

## 2017-03-28 DIAGNOSIS — G40309 Generalized idiopathic epilepsy and epileptic syndromes, not intractable, without status epilepticus: Secondary | ICD-10-CM | POA: Diagnosis not present

## 2017-04-24 DIAGNOSIS — Z0100 Encounter for examination of eyes and vision without abnormal findings: Secondary | ICD-10-CM | POA: Diagnosis not present

## 2017-05-21 DIAGNOSIS — H04123 Dry eye syndrome of bilateral lacrimal glands: Secondary | ICD-10-CM | POA: Diagnosis not present

## 2017-07-04 DIAGNOSIS — G40319 Generalized idiopathic epilepsy and epileptic syndromes, intractable, without status epilepticus: Secondary | ICD-10-CM | POA: Diagnosis not present

## 2017-07-04 DIAGNOSIS — G40309 Generalized idiopathic epilepsy and epileptic syndromes, not intractable, without status epilepticus: Secondary | ICD-10-CM | POA: Diagnosis not present

## 2017-07-22 ENCOUNTER — Ambulatory Visit: Payer: BLUE CROSS/BLUE SHIELD | Admitting: Obstetrics & Gynecology

## 2017-07-22 ENCOUNTER — Encounter: Payer: Self-pay | Admitting: Obstetrics & Gynecology

## 2017-07-22 VITALS — BP 118/64 | HR 82 | Resp 14 | Ht 65.25 in | Wt 212.0 lb

## 2017-07-22 DIAGNOSIS — Z01419 Encounter for gynecological examination (general) (routine) without abnormal findings: Secondary | ICD-10-CM | POA: Diagnosis not present

## 2017-07-22 NOTE — Progress Notes (Signed)
41 y.o. G0P0000 Married Caucasian F here for annual exam.  Doing well.  Husband has decided to leave where he's worked for 22 years.  He's already gotten a new job.    Has recently seen neurologist due to increased headaches.  This has stabilized. Has had some medication changes.  Has not had any seizures but some "prodromal like" symptoms.    Denies vaginal bleeding.  No pelvic pain.     Patient's last menstrual period was 04/27/2016 (approximate).          Sexually active: Yes.    The current method of family planning is status post hysterectomy.   Exercising: Yes.    Gym/ health club routine includes cardio. Smoker:  no  Health Maintenance: Pap:  04/24/16 negative, 07/02/14 negative, 07/01/13 negative, HR HPV negative  History of abnormal Pap:  no MMG:  never Colonoscopy:  2014 normal  BMD:   never TDaP:  03/18/14  Pneumonia vaccine(s):  never Zostavax:   never Hep C testing: not indicated  Screening Labs: discuss with provider, Hb today: same   reports that she has never smoked. She has never used smokeless tobacco. She reports that she drinks about 3.6 oz of alcohol per week . She reports that she does not use drugs.  Past Medical History:  Diagnosis Date  . Anxiety   . Depression   . Endometriosis   . Epilepsy (HCC)    last seizure - years ago  . GERD (gastroesophageal reflux disease)   . History of migraine headaches   . Seizures (HCC)    controlled with meds, last seizure 4-5 yrs ago    Past Surgical History:  Procedure Laterality Date  . CARPAL TUNNEL RELEASE Right 06/14/2015   Procedure: RIGHT CARPAL TUNNEL RELEASE ENDOSCOPIC;  Surgeon: Mack Hook, MD;  [facility];  Service: Orthopedics;  Laterality: Right;  . COLONOSCOPY WITH PROPOFOL N/A 02/10/2013   Procedure: COLONOSCOPY WITH PROPOFOL;  Surgeon: Charolett Bumpers, MD;  Location: [facility];  Service: Endoscopy;  Laterality: N/A;  . CYSTOSCOPY N/A 05/14/2016   Procedure: CYSTOSCOPY;   Surgeon: Jerene Bears, MD;  Location: [facility];  Service: Gynecology;  Laterality: N/A;  . DIAGNOSTIC LAPAROSCOPY  12/15/2009   uterosacral nerve ablation  . ELBOW SURGERY Left 2012  . LAPAROSCOPIC BILATERAL SALPINGECTOMY Bilateral 05/14/2016   Procedure: LAPAROSCOPIC BILATERAL SALPINGECTOMY;  Surgeon: Jerene Bears, MD;  Location: [facility];  Service: Gynecology;  Laterality: Bilateral;  . LAPAROSCOPIC HYSTERECTOMY N/A 05/14/2016   Procedure: HYSTERECTOMY TOTAL LAPAROSCOPIC;  Surgeon: Jerene Bears, MD;  Location: [facility];  Service: Gynecology;  Laterality: N/A;  . UMBILICAL HERNIA REPAIR     as an infant  . WISDOM TOOTH EXTRACTION    . YAG LASER APPLICATION  06/24/2006   vaporization of endometriosis    Current Outpatient Prescriptions  Medication Sig Dispense Refill  . amitriptyline (ELAVIL) 50 MG tablet Take 100 mg by mouth. Takes 2 at bedtime    . cholecalciferol (VITAMIN D) 1000 UNITS tablet Take 1,000 Units by mouth daily.    . clonazePAM (KLONOPIN) 0.5 MG tablet Take 0.5 mg by mouth as needed (as needed for seizures).     . FIBER PO Take 1 packet by mouth daily.     Marland Kitchen ibuprofen (ADVIL,MOTRIN) 800 MG tablet TAKE 1 TABLET BY MOUTH EVERY 8 HOURS AS NEEDED 30 tablet 1  . Lactobacillus (PROBIOTIC ACIDOPHILUS PO) Take by mouth daily.    Marland Kitchen lamoTRIgine (LAMICTAL) 100 MG tablet Take  200 mg by mouth 2 (two) times daily.     Marland Kitchen levETIRAcetam (KEPPRA) 500 MG tablet TAKE 1 TABLET BY MOUTH EVERYDAY AT BEDTIME    . linaclotide (LINZESS) 290 MCG CAPS capsule Take 1 capsule (290 mcg total) by mouth daily before breakfast. 90 capsule 3  . loratadine (CLARITIN) 10 MG tablet Take 10 mg by mouth as needed for allergies. Reported on 12/13/2015    . MAGNESIUM PO     . Multiple Vitamins-Minerals (MULTIVITAMIN PO) Take 1 tablet by mouth daily.     . Potassium 99 MG TABS     . Psyllium (METAMUCIL FIBER PO) Take by mouth. 1 teaspoon daily    . ranitidine (ZANTAC) 150 MG tablet Take 150 mg by mouth as needed for  heartburn.     . vitamin E 400 UNIT capsule     . zonisamide (ZONEGRAN) 100 MG capsule TAKE 3-4 CAPSULES BY MOUTH AT BEDTIME     No current facility-administered medications for this visit.     Family History  Problem Relation Age of Onset  . Hypertension Mother   . Diabetes Mother   . Irritable bowel syndrome Mother   . Hypertension Maternal Grandmother   . Diabetes Maternal Grandmother   . Other Father        bile duct cancer  . Irritable bowel syndrome Maternal Aunt     ROS:  Pertinent items are noted in HPI.  Otherwise, a comprehensive ROS was negative.  Exam:   BP 118/64 (BP Location: Right Arm, Patient Position: Sitting, Cuff Size: Normal)   Pulse 82   Resp 14   Ht 5' 5.25" (1.657 m)   Wt 212 lb (96.2 kg)   LMP 04/27/2016 (Approximate)   BMI 35.01 kg/m    Height: 5' 5.25" (165.7 cm)  Ht Readings from Last 3 Encounters:  07/22/17 5' 5.25" (1.657 m)  11/29/16  (1.676 m)  08/30/16  (1.676 m)    General appearance: alert, cooperative and appears stated age Head: Normocephalic, without obvious abnormality, atraumatic Neck: no adenopathy, supple, symmetrical, trachea midline and thyroid normal to inspection and palpation Lungs: clear to auscultation bilaterally Breasts: normal appearance, no masses or tenderness Heart: regular rate and rhythm Abdomen: soft, non-tender; bowel sounds normal; no masses,  no organomegaly Extremities: extremities normal, atraumatic, no cyanosis or edema Skin: Skin color, texture, turgor normal. No rashes or lesions Lymph nodes: Cervical, supraclavicular, and axillary nodes normal. No abnormal inguinal nodes palpated Neurologic: Grossly normal   Pelvic: External genitalia:  no lesions              Urethra:  normal appearing urethra with no masses, tenderness or lesions              Bartholins and Skenes: normal                 Vagina: normal appearing vagina with normal color and discharge, no lesions              Cervix:  absent              Pap taken: No. Bimanual Exam:  Uterus:  uterus absent              Adnexa: no mass, fullness, tenderness               Rectovaginal: Confirms               Anus:  normal sphincter tone, no lesions  Chaperone was present  for exam.  A:  Well Woman with normal exam H/o chronic pelvic pain resolved after TLH/Bilateral salpingectomy/cystoscopy done 05/14/16.  Pt did see PT post-op which helped finally resolve residual pain H/O migraines H/O epilepsy, followed by neurology  P:   Mammogram guidelines reviewed.  Will plan 3D MMG after her 54th birthday.  Information for schedulign provided pap smear not indicated Will plan for pt to have fasting lab work next year.  Pt comfortable with plan Return annually or prn

## 2017-07-29 DIAGNOSIS — Z0000 Encounter for general adult medical examination without abnormal findings: Secondary | ICD-10-CM | POA: Diagnosis not present

## 2017-07-29 DIAGNOSIS — Z23 Encounter for immunization: Secondary | ICD-10-CM | POA: Diagnosis not present

## 2017-07-29 DIAGNOSIS — R635 Abnormal weight gain: Secondary | ICD-10-CM | POA: Diagnosis not present

## 2017-08-28 DIAGNOSIS — M545 Low back pain: Secondary | ICD-10-CM | POA: Diagnosis not present

## 2017-10-10 ENCOUNTER — Ambulatory Visit: Payer: BLUE CROSS/BLUE SHIELD | Admitting: Obstetrics & Gynecology

## 2017-10-10 ENCOUNTER — Encounter: Payer: Self-pay | Admitting: Obstetrics & Gynecology

## 2017-10-10 ENCOUNTER — Other Ambulatory Visit: Payer: Self-pay

## 2017-10-10 ENCOUNTER — Telehealth: Payer: Self-pay | Admitting: Obstetrics & Gynecology

## 2017-10-10 VITALS — BP 110/70 | HR 72 | Resp 16 | Ht 65.25 in | Wt 215.0 lb

## 2017-10-10 DIAGNOSIS — N766 Ulceration of vulva: Secondary | ICD-10-CM | POA: Diagnosis not present

## 2017-10-10 DIAGNOSIS — N898 Other specified noninflammatory disorders of vagina: Secondary | ICD-10-CM

## 2017-10-10 MED ORDER — VALACYCLOVIR HCL 1 G PO TABS: 1000.0000 mg | ORAL_TABLET | Freq: Two times a day (BID) | ORAL | 0 refills | Status: DC

## 2017-10-10 MED ORDER — FLUCONAZOLE 150 MG PO TABS: 150.0000 mg | ORAL_TABLET | Freq: Once | ORAL | 0 refills | Status: AC

## 2017-10-10 MED ORDER — LIDOCAINE 5 % EX OINT: TOPICAL_OINTMENT | CUTANEOUS | 0 refills | Status: DC

## 2017-10-10 NOTE — Telephone Encounter (Signed)
Patient called and requested an appointment for today, if possible. She said she has a vaginal tear and is experiencing some spotting in that area.  Last seen: 07/22/17

## 2017-10-10 NOTE — Telephone Encounter (Signed)
Spoke with patient. Patient states that she had intercourse a few days ago and believes she has a vaginal tear. Reports pain at the vaginal opening with bleeding. Thought this would get better over time but has only worsened. Advised will need to be seen for further evaluation. Patient is agreeable. Appointment scheduled for today at 3:15 pm with Dr.Miller. Patient is agreeable to date and time. Aware this is a work in appointment.  Routing to provider for final review. Patient agreeable to disposition. Will close encounter.

## 2017-10-10 NOTE — Progress Notes (Signed)
GYNECOLOGY  VISIT  CC:   Pain after intercourse, possible tear  HPI: 41 y.o. 520P0000 Married Caucasian female here for vaginal symptoms that started on Sunday after intercourse.  Had no pain with intercourse but then noted the bleeding afterwards.  Reports she is having some discharge as well.  Denies no vaginal odor.  Pain is right at vagina and just inside.  Pain is not deep.  Has not been SA again since.  Denies urinary complaints, fever, or bowel habit change.  GYNECOLOGIC HISTORY: Patient's last menstrual period was 04/27/2016 (approximate). Contraception: hysterectomy  Menopausal hormone therapy: none  Patient Active Problem List   Diagnosis Date Noted  . Anxiety 12/14/2016  . Endometriosis 04/25/2016  . PCOS (polycystic ovarian syndrome) 03/19/2016  . Pelvic pain in female 03/19/2016  . Epilepsy with partial complex seizures (HCC) 01/21/2016  . Idiopathic generalized epilepsy (HCC) 12/06/2015  . Migraine without aura and responsive to treatment 12/06/2015    Past Medical History:  Diagnosis Date  . Anxiety   . Depression   . Endometriosis   . Epilepsy (HCC)    last seizure - years ago  . GERD (gastroesophageal reflux disease)   . History of migraine headaches   . Seizures (HCC)    controlled with meds, last seizure 4-5 yrs ago    Past Surgical History:  Procedure Laterality Date  . CARPAL TUNNEL RELEASE Right 06/14/2015   Procedure: RIGHT CARPAL TUNNEL RELEASE ENDOSCOPIC;  Surgeon: Mack Hookavid Thompson, MD;  Location: [facility];  Service: Orthopedics;  Laterality: Right;  . COLONOSCOPY WITH PROPOFOL N/A 02/10/2013   Procedure: COLONOSCOPY WITH PROPOFOL;  Surgeon: Charolett BumpersMartin K Johnson, MD;  Location: [facility];  Service: Endoscopy;  Laterality: N/A;  . CYSTOSCOPY N/A 05/14/2016   Procedure: CYSTOSCOPY;  Surgeon: Jerene BearsMary S Miller, MD;  Location: [facility];  Service: Gynecology;  Laterality: N/A;  . DIAGNOSTIC LAPAROSCOPY  12/15/2009   uterosacral nerve ablation  .  ELBOW SURGERY Left 2012  . LAPAROSCOPIC BILATERAL SALPINGECTOMY Bilateral 05/14/2016   Procedure: LAPAROSCOPIC BILATERAL SALPINGECTOMY;  Surgeon: Jerene BearsMary S Miller, MD;  Location: [facility];  Service: Gynecology;  Laterality: Bilateral;  . LAPAROSCOPIC HYSTERECTOMY N/A 05/14/2016   Procedure: HYSTERECTOMY TOTAL LAPAROSCOPIC;  Surgeon: Jerene BearsMary S Miller, MD;  Location: [facility];  Service: Gynecology;  Laterality: N/A;  . UMBILICAL HERNIA REPAIR     as an infant  . WISDOM TOOTH EXTRACTION    . YAG LASER APPLICATION  06/24/2006   vaporization of endometriosis    MEDS:   Current Outpatient Medications on File Prior to Visit  Medication Sig Dispense Refill  . amitriptyline (ELAVIL) 50 MG tablet Take 100 mg by mouth. Takes 2 at bedtime    . cholecalciferol (VITAMIN D) 1000 UNITS tablet Take 1,000 Units by mouth daily.    . clonazePAM (KLONOPIN) 0.5 MG tablet Take 0.5 mg by mouth as needed (as needed for seizures).     . FIBER PO Take 1 packet by mouth daily.     . Lactobacillus (PROBIOTIC ACIDOPHILUS PO) Take by mouth daily.    Marland Kitchen. lamoTRIgine (LAMICTAL) 100 MG tablet Take 200 mg by mouth 2 (two) times daily.     Marland Kitchen. levETIRAcetam (KEPPRA) 500 MG tablet TAKE 1 TABLET BY MOUTH EVERYDAY AT BEDTIME    . linaclotide (LINZESS) 290 MCG CAPS capsule Take 1 capsule (290 mcg total) by mouth daily before breakfast. 90 capsule 3  . loratadine (CLARITIN) 10 MG tablet Take 10 mg by mouth as needed for allergies. Reported on  12/13/2015    . MAGNESIUM PO     . Multiple Vitamins-Minerals (MULTIVITAMIN PO) Take 1 tablet by mouth daily.     . Potassium 99 MG TABS     . Psyllium (METAMUCIL FIBER PO) Take by mouth. 1 teaspoon daily    . ranitidine (ZANTAC) 150 MG tablet Take 150 mg by mouth as needed for heartburn.     . vitamin E 400 UNIT capsule     . zonisamide (ZONEGRAN) 100 MG capsule TAKE 3-4 CAPSULES BY MOUTH AT BEDTIME     No current facility-administered medications on file prior to visit.     ALLERGIES: Wellbutrin  [bupropion hcl]; Escitalopram oxalate; and Topiramate  Family History  Problem Relation Age of Onset  . Hypertension Mother   . Diabetes Mother   . Irritable bowel syndrome Mother   . Hypertension Maternal Grandmother   . Diabetes Maternal Grandmother   . Other Father        bile duct cancer  . Irritable bowel syndrome Maternal Aunt     SH:  Married, non smoker  Review of Systems  Genitourinary:       Pain with intercourse Vulvar itching  All other systems reviewed and are negative.   PHYSICAL EXAMINATION:    BP 110/70 (BP Location: Right Arm, Patient Position: Sitting, Cuff Size: Large)   Pulse 72   Resp 16   Ht 5' 5.25" (1.657 m)   Wt 215 lb (97.5 kg)   LMP 04/27/2016 (Approximate)   BMI 35.50 kg/m     General appearance: alert, cooperative and appears stated age Abdomen: soft, non-tender; bowel sounds normal; no masses,  no organomegaly Inguinal:  No enlarged LAD  Pelvic: External genitalia:  no lesions              Urethra:  normal appearing urethra with no masses, tenderness or lesions              Bartholins and Skenes: normal                 Vagina: bilateral ulcerations just at introitus, extremely tender to palpation              Cervix: absent              Bimanual Exam:  Uterus:  normal and normal size, contour, position, consistency, mobility, non-tender              Adnexa: no mass, fullness, tenderness              Anus:  no lesions  Chaperone was present for exam.  Assessment: Vaginal ulcerations c/w HSV Vulvar pain Vaginal discharge  Plan: Affirm pending HSV PCR pending Starting Valtrex 1gm bid x 10 days, just to cover until results comes back Topical lidocaine 5% prn pain control Diflucan 150mg  po x 1, repeat 72 hours.

## 2017-10-12 LAB — VAGINITIS/VAGINOSIS, DNA PROBE
CANDIDA SPECIES: NEGATIVE
GARDNERELLA VAGINALIS: POSITIVE — AB
Trichomonas vaginosis: NEGATIVE

## 2017-10-14 ENCOUNTER — Telehealth: Payer: Self-pay

## 2017-10-14 LAB — HSV NAA
HSV 1 NAA: NEGATIVE
HSV 2 NAA: NEGATIVE

## 2017-10-14 MED ORDER — METRONIDAZOLE 500 MG PO TABS: 500.0000 mg | ORAL_TABLET | Freq: Two times a day (BID) | ORAL | 0 refills | Status: DC

## 2017-10-14 NOTE — Telephone Encounter (Signed)
Spoke with patient. Advised of message as seen below from Dr.Miller. Patient is agreeable and verbalizes understanding. Rx for Flagyl 500 mg po BID x 7 days #14 0RF sent to pharmacy on file. Avoid alcohol during treatment and 24 hours after completing medication. Don't mix with alcohol if mixed can cause severe nausea, vomiting and abdominal cramping.   Encounter closed.

## 2017-10-14 NOTE — Telephone Encounter (Signed)
Left message to call Kaitlyn at [phone]. 

## 2017-10-14 NOTE — Telephone Encounter (Signed)
-----   Message from Jerene BearsMary S Miller, MD sent at 10/14/2017 10:30 AM EST ----- Please let pt know her HSV testing was negative.  She tested positive for BV.  Ok to treat with Metrogel 0.75%, one applicator QHS x 5 nights OR flagyl 500mg  bid x 7 days.  If pt chooses oral medication, please advise no ETOH while on medication.  No additional follow up is needed if symptoms fully resolve.

## 2017-10-31 DIAGNOSIS — G40309 Generalized idiopathic epilepsy and epileptic syndromes, not intractable, without status epilepticus: Secondary | ICD-10-CM | POA: Diagnosis not present

## 2017-10-31 DIAGNOSIS — G43009 Migraine without aura, not intractable, without status migrainosus: Secondary | ICD-10-CM | POA: Diagnosis not present

## 2017-11-22 ENCOUNTER — Other Ambulatory Visit: Payer: Self-pay | Admitting: Physician Assistant

## 2017-12-31 ENCOUNTER — Encounter: Payer: Self-pay | Admitting: Certified Nurse Midwife

## 2017-12-31 ENCOUNTER — Other Ambulatory Visit: Payer: Self-pay

## 2017-12-31 ENCOUNTER — Ambulatory Visit: Payer: BLUE CROSS/BLUE SHIELD | Admitting: Certified Nurse Midwife

## 2017-12-31 VITALS — BP 120/80 | HR 68 | Resp 16 | Ht 65.25 in | Wt 216.0 lb

## 2017-12-31 DIAGNOSIS — N898 Other specified noninflammatory disorders of vagina: Secondary | ICD-10-CM | POA: Diagnosis not present

## 2017-12-31 DIAGNOSIS — Z01419 Encounter for gynecological examination (general) (routine) without abnormal findings: Secondary | ICD-10-CM

## 2017-12-31 DIAGNOSIS — Z113 Encounter for screening for infections with a predominantly sexual mode of transmission: Secondary | ICD-10-CM

## 2017-12-31 NOTE — Progress Notes (Signed)
42 y.o. Married Caucasian female G0P0000 here with complaint of vaginal symptoms of itching, with scant discharge..Onset of symptoms 2 days ago. Denies new personal products. Had episode of diarrhea also and may have had some contamination during episode. STD concerns. Urinary symptoms none . Contraception is hysterectomy. Spouse has been having problems with scrotal issues. Was treated for bacterial infection and is concerned if transmission from him caused her issues or possible STD? Desires screening for GC/Chlamydia. No other health issues today.  ROS pertinent to HPI  O:Healthy female WDWN Affect: normal, orientation x 3  Exam:Skin: warm and dry Abdomen:soft, non tender  Inguinal Lymph nodes: no enlargement or tenderness Pelvic exam: External genital: normal female, no exudate or scaling BUS: negative Vagina: white non odorous discharge noted.Slight tenderness at introitus with slight increase in pink tissue only  Affirm taken Cervix/Uterus surgically absent: Adnexa:normal, non tender, no masses or fullness noted   A:Normal pelvic exam Vaginal itching R/O infection STD screening vaginal only   P:Discussed findings of normal pelvic exam with slight irritation noted at introitus.  Discussed Aveeno or baking soda sitz bath for comfort. Lab: affirm, GC/chlamydia  Rv prn

## 2018-01-01 LAB — CHLAMYDIA/GC NAA, CONFIRMATION
CHLAMYDIA TRACHOMATIS, NAA: NEGATIVE
Neisseria gonorrhoeae, NAA: NEGATIVE

## 2018-01-01 LAB — VAGINITIS/VAGINOSIS, DNA PROBE
Candida Species: NEGATIVE
GARDNERELLA VAGINALIS: POSITIVE — AB
TRICHOMONAS VAG: NEGATIVE

## 2018-01-02 ENCOUNTER — Other Ambulatory Visit: Payer: Self-pay

## 2018-01-02 MED ORDER — METRONIDAZOLE 500 MG PO TABS: 500.0000 mg | ORAL_TABLET | Freq: Two times a day (BID) | ORAL | 0 refills | Status: DC

## 2018-01-09 ENCOUNTER — Encounter: Payer: Self-pay | Admitting: Certified Nurse Midwife

## 2018-01-27 ENCOUNTER — Encounter: Payer: Self-pay | Admitting: Obstetrics & Gynecology

## 2018-01-27 ENCOUNTER — Telehealth: Payer: Self-pay | Admitting: Obstetrics & Gynecology

## 2018-01-27 NOTE — Telephone Encounter (Signed)
Message   ----- Message from Mychart, Generic sent at 01/27/2018 8:43 AM EDT -----    Is there anything I can do for everyday vaginal dryness? What I am experiencing is similar to dry hands during the winter. However, it's not as severe or all the time. I know I can use lube to remedy vaginal dryness when I have sex, but is there something that'll help the rest of the time? Thank you.

## 2018-05-05 DIAGNOSIS — M79672 Pain in left foot: Secondary | ICD-10-CM | POA: Diagnosis not present

## 2018-06-02 DIAGNOSIS — R413 Other amnesia: Secondary | ICD-10-CM | POA: Diagnosis not present

## 2018-06-02 DIAGNOSIS — M542 Cervicalgia: Secondary | ICD-10-CM | POA: Diagnosis not present

## 2018-06-02 DIAGNOSIS — G40309 Generalized idiopathic epilepsy and epileptic syndromes, not intractable, without status epilepticus: Secondary | ICD-10-CM | POA: Diagnosis not present

## 2018-06-02 DIAGNOSIS — G43009 Migraine without aura, not intractable, without status migrainosus: Secondary | ICD-10-CM | POA: Diagnosis not present

## 2018-06-03 DIAGNOSIS — M2012 Hallux valgus (acquired), left foot: Secondary | ICD-10-CM | POA: Diagnosis not present

## 2018-06-03 DIAGNOSIS — M2011 Hallux valgus (acquired), right foot: Secondary | ICD-10-CM | POA: Diagnosis not present

## 2018-06-06 DIAGNOSIS — G40309 Generalized idiopathic epilepsy and epileptic syndromes, not intractable, without status epilepticus: Secondary | ICD-10-CM | POA: Diagnosis not present

## 2018-07-13 ENCOUNTER — Encounter (HOSPITAL_COMMUNITY): Payer: Self-pay | Admitting: Emergency Medicine

## 2018-07-13 ENCOUNTER — Emergency Department (HOSPITAL_COMMUNITY)
Admission: EM | Admit: 2018-07-13 | Discharge: 2018-07-13 | Disposition: A | Discharge status: Home / Self Care | Payer: BLUE CROSS/BLUE SHIELD | Attending: Emergency Medicine | Admitting: Emergency Medicine

## 2018-07-13 ENCOUNTER — Other Ambulatory Visit: Payer: Self-pay

## 2018-07-13 DIAGNOSIS — R471 Dysarthria and anarthria: Secondary | ICD-10-CM | POA: Diagnosis not present

## 2018-07-13 DIAGNOSIS — G43909 Migraine, unspecified, not intractable, without status migrainosus: Secondary | ICD-10-CM | POA: Insufficient documentation

## 2018-07-13 DIAGNOSIS — Z79899 Other long term (current) drug therapy: Secondary | ICD-10-CM | POA: Diagnosis not present

## 2018-07-13 DIAGNOSIS — R4182 Altered mental status, unspecified: Secondary | ICD-10-CM | POA: Insufficient documentation

## 2018-07-13 DIAGNOSIS — R202 Paresthesia of skin: Secondary | ICD-10-CM

## 2018-07-13 DIAGNOSIS — G40909 Epilepsy, unspecified, not intractable, without status epilepticus: Secondary | ICD-10-CM | POA: Diagnosis not present

## 2018-07-13 DIAGNOSIS — R200 Anesthesia of skin: Secondary | ICD-10-CM | POA: Diagnosis not present

## 2018-07-13 NOTE — Discharge Instructions (Addendum)
There is no sign of an acute neurologic event at this time.  Contact your neurologist in the morning and see if they can advance the EEG to sooner this week.  Return here, if needed, for problems.

## 2018-07-13 NOTE — ED Triage Notes (Addendum)
Pt c/o confusion and L facial tingling intermittently x 5 days. Pt with hx of epilepsy and states has had confusion when taking Klonopin in the past and has been needing Klonopin for the past 5 days. Pt states difficulty speaking at times.

## 2018-07-13 NOTE — ED Notes (Signed)
ED Provider at bedside. 

## 2018-07-13 NOTE — ED Provider Notes (Signed)
[facility]-EMERGENCY DEPT Provider Note   CSN: [id number] Arrival date & time: 07/13/18  1150     History   Chief Complaint Chief Complaint  Patient presents with  . facial numbness  . Altered Mental Status    HPI April Campbell is a 42 y.o. female.  HPI   She is here for evaluation of confusion with left facial numbness.  She has been using Klonopin more than usual because of anxiousness recently.  She also has occasionally noticed problems with speech.  The symptoms occasionally occur together and sometimes alone.  None of them are constant.  All of them have been more common than usual over the last several days.  Because of that she contact her neurologist to schedule her for an outpatient EEG, scheduled to be done in 5 days.  She continues to take her medications for epilepsy, and commonly uses Klonopin when she feels like she will have seizures.  She does not have tonic-clonic seizure disorder.  She denies recent fever, chills, nausea, vomiting, cough, shortness of breath or chest pain.  She denies stress.  There are no other known modifying factors.  Past Medical History:  Diagnosis Date  . Anxiety   . Depression   . Endometriosis   . Epilepsy (HCC)    last seizure - years ago  . Epilepsy (HCC)   . GERD (gastroesophageal reflux disease)   . History of migraine headaches   . Seizures (HCC)    controlled with meds, last seizure 4-5 yrs ago    Patient Active Problem List   Diagnosis Date Noted  . Anxiety 12/14/2016  . Endometriosis 04/25/2016  . PCOS (polycystic ovarian syndrome) 03/19/2016  . Pelvic pain in female 03/19/2016  . Epilepsy with partial complex seizures (HCC) 01/21/2016  . Idiopathic generalized epilepsy (HCC) 12/06/2015  . Migraine without aura and responsive to treatment 12/06/2015    Past Surgical History:  Procedure Laterality Date  . CARPAL TUNNEL RELEASE Right 06/14/2015   Procedure: RIGHT CARPAL TUNNEL RELEASE  ENDOSCOPIC;  Surgeon: Mack Hook, MD;  Location: [facility];  Service: Orthopedics;  Laterality: Right;  . COLONOSCOPY WITH PROPOFOL N/A 02/10/2013   Procedure: COLONOSCOPY WITH PROPOFOL;  Surgeon: Charolett Bumpers, MD;  Location: [facility];  Service: Endoscopy;  Laterality: N/A;  . CYSTOSCOPY N/A 05/14/2016   Procedure: CYSTOSCOPY;  Surgeon: Jerene Bears, MD;  Location: [facility];  Service: Gynecology;  Laterality: N/A;  . DIAGNOSTIC LAPAROSCOPY  12/15/2009   uterosacral nerve ablation  . ELBOW SURGERY Left 2012  . LAPAROSCOPIC BILATERAL SALPINGECTOMY Bilateral 05/14/2016   Procedure: LAPAROSCOPIC BILATERAL SALPINGECTOMY;  Surgeon: Jerene Bears, MD;  Location: [facility];  Service: Gynecology;  Laterality: Bilateral;  . LAPAROSCOPIC HYSTERECTOMY N/A 05/14/2016   Procedure: HYSTERECTOMY TOTAL LAPAROSCOPIC;  Surgeon: Jerene Bears, MD;  Location: [facility];  Service: Gynecology;  Laterality: N/A;  . UMBILICAL HERNIA REPAIR     as an infant  . WISDOM TOOTH EXTRACTION    . YAG LASER APPLICATION  06/24/2006   vaporization of endometriosis     OB History    Gravida  0   Para  0   Term  0   Preterm  0   AB  0   Living  0     SAB  0   TAB  0   Ectopic  0   Multiple  0   Live Births  Home Medications    Prior to Admission medications   Medication Sig Start Date End Date Taking? Authorizing Provider  amitriptyline (ELAVIL) 50 MG tablet Take 100 mg by mouth at bedtime.    Yes [provider]  clonazePAM (KLONOPIN) 0.5 MG tablet Take 0.5 mg by mouth 2 (two) times daily as needed for anxiety (or for seizures).  03/30/15  Yes [provider]  ibuprofen (ADVIL,MOTRIN) 200 MG tablet Take 800 mg by mouth every 6 (six) hours as needed for headache or moderate pain.   Yes [provider]  Lactobacillus (PROBIOTIC ACIDOPHILUS PO) Take 1 capsule by mouth daily.    Yes [provider]  lamoTRIgine (LAMICTAL) 100 MG tablet Take  200 mg by mouth 2 (two) times daily.    Yes [provider]  loratadine (CLARITIN) 10 MG tablet Take 10 mg by mouth daily.    Yes [provider]  Potassium 99 MG TABS Take 99 mg by mouth daily.    Yes [provider]  zonisamide (ZONEGRAN) 100 MG capsule Take 400 mg by mouth at bedtime.  07/04/17  Yes [provider]  LINZESS 290 MCG CAPS capsule TAKE ONE CAPSULE BY MOUTH DAILY BEFORE BREAKFAST Patient not taking: Reported on 07/13/2018 11/22/17   Sammuel CooperEsterwood, Amy S, PA-C    Family History Family History  Problem Relation Age of Onset  . Hypertension Mother   . Diabetes Mother   . Irritable bowel syndrome Mother   . Hypertension Maternal Grandmother   . Diabetes Maternal Grandmother   . Other Father        bile duct cancer  . Irritable bowel syndrome Maternal Aunt     Social History Social History   Tobacco Use  . Smoking status: Never Smoker  . Smokeless tobacco: Never Used  Substance Use Topics  . Alcohol use: Yes    Alcohol/week: 2.0 - 3.0 standard drinks    Types: 2 - 3 Standard drinks or equivalent per week    Comment: social  . Drug use: No     Allergies   Wellbutrin [bupropion hcl]; Escitalopram oxalate; and Topiramate   Review of Systems Review of Systems  All other systems reviewed and are negative.    Physical Exam Updated Vital Signs BP 138/79 (BP Location: Left Arm)   Pulse 79   Temp 98.7 F (37.1 C) (Oral)   LMP 04/27/2016 (Approximate)   SpO2 100%   Physical Exam  Constitutional: She is oriented to [place], place, and time. She appears well-developed and well-nourished. She appears distressed (She is uncomfortable.  She is tearful discussing her vision.).  HENT:  Head: Normocephalic and atraumatic.  Eyes: Pupils are equal, round, and reactive to light. Conjunctivae and EOM are normal.  Neck: Normal range of motion and phonation normal. Neck supple.  Cardiovascular: Normal rate and regular rhythm.    Pulmonary/Chest: Effort normal and breath sounds normal. She exhibits no tenderness.  Abdominal: Soft. She exhibits no distension. There is no tenderness. There is no guarding.  Musculoskeletal: Normal range of motion.  Normal strength arms and legs bilaterally.  Neurological: She is alert and oriented to [place], place, and time. She exhibits normal muscle tone.  No dysarthria, aphasia or nystagmus.  No ataxia.  No pronator drift.  Skin: Skin is warm and dry.  Psychiatric: She has a normal mood and affect. Her behavior is normal. Judgment and thought content normal.  Nursing note and vitals reviewed.    ED Treatments / Results  Labs (all  labs ordered are listed, but only abnormal results are displayed) Labs Reviewed - No data to display  EKG EKG Interpretation  Date/Time:  Sunday July 13 2018 12:02:47 EDT Ventricular Rate:  80 PR Interval:    QRS Duration: 87 QT Interval:  371 QTC Calculation: 428 R Axis:   46 Text Interpretation:  Sinus rhythm Low voltage, precordial leads Baseline wander in lead(s) V2 Since last tracing rate slower Confirmed by Mancel Bale [phone]) on 07/13/2018 1:39:03 PM   Radiology No results found.  Procedures Procedures (including critical care time)  Medications Ordered in ED Medications - No data to display   Initial Impression / Assessment and Plan / ED Course  I have reviewed the triage vital signs and the nursing notes.  Pertinent labs & imaging results that were available during my care of the patient were reviewed by me and considered in my medical decision making (see chart for details).      Patient Vitals for the past 24 hrs:  BP Temp Temp src Pulse SpO2  07/13/18 1202 138/79 98.7 F (37.1 C) Oral 79 100 %    2:01 PM Reevaluation with update and discussion. After initial assessment and treatment, an updated evaluation reveals no change in clinical status.  Findings discussed with patient and husband, all questions were  answered. Mancel Bale   Medical Decision Making: Nonspecific symptoms and patient with known epilepsy, currently being treated.  Patient has outpatient EEG scheduled for follow-up in 5 days.  No evidence for persistent neurologic deficit therefore doubt CVA, or acute CNS abnormality such as brain tumor.  No evidence for metabolic instability.  No indication for further ED treatment or evaluation, at this time.  CRITICAL CARE-no Performed by: Mancel Bale   Nursing Notes Reviewed/ Care Coordinated Applicable Imaging Reviewed Interpretation of Laboratory Data incorporated into ED treatment  The patient appears reasonably screened and/or stabilized for discharge and I doubt any other medical condition or other [facility] requiring further screening, evaluation, or treatment in the ED at this time prior to discharge.  Plan: Home Medications-continue current medications; Home Treatments-rest, fluids; return here if the recommended treatment, does not improve the symptoms; Recommended follow up-PCP, and neurology, as needed and as scheduled     Final Clinical Impressions(s) / ED Diagnoses   Final diagnoses:  Paresthesia  Dysarthria  Nonintractable epilepsy without status epilepticus, unspecified epilepsy type [facility])    ED Discharge Orders    None       Mancel Bale, MD 07/13/18 1401

## 2018-07-14 DIAGNOSIS — G43009 Migraine without aura, not intractable, without status migrainosus: Secondary | ICD-10-CM | POA: Diagnosis not present

## 2018-07-16 DIAGNOSIS — G40309 Generalized idiopathic epilepsy and epileptic syndromes, not intractable, without status epilepticus: Secondary | ICD-10-CM | POA: Diagnosis not present

## 2018-08-19 ENCOUNTER — Encounter: Payer: Self-pay | Admitting: Obstetrics & Gynecology

## 2018-08-19 ENCOUNTER — Ambulatory Visit: Payer: BLUE CROSS/BLUE SHIELD | Admitting: Obstetrics & Gynecology

## 2018-08-19 ENCOUNTER — Encounter

## 2018-08-19 ENCOUNTER — Other Ambulatory Visit: Payer: Self-pay

## 2018-08-19 VITALS — BP 110/70 | HR 88 | Resp 16 | Ht 65.0 in | Wt 217.8 lb

## 2018-08-19 DIAGNOSIS — Z0000 Encounter for general adult medical examination without abnormal findings: Secondary | ICD-10-CM

## 2018-08-19 NOTE — Progress Notes (Signed)
42 y.o. G0P0000 Married White or Caucasian female here for annual exam.  Seeing neurologist at [facility], Dr. Antonietta Barcelona.  Was having partial seizures.  Had EEG done in early September.  This confirmed the seizure activity.  Medication has been adjusted.  Patient wonders if she is having some food reactions as well.    Denies vaginal bleeding.    Patient's last menstrual period was 04/27/2016 (approximate).          Sexually active: Yes.    The current method of family planning is status post hysterectomy.    Exercising: Yes.    cardio, weights Smoker:  no  Health Maintenance: Pap:  04/24/16 Neg   07/02/14 Neg  History of abnormal Pap:  no MMG:  D/w pt guidelines and starting now Colonoscopy:  02/10/13 Normal. Repeat age 60 TDaP:  41 . Screening Labs: Here today - fasting   reports that she has never smoked. She has never used smokeless tobacco. She reports that she drinks about 2.0 standard drinks of alcohol per week. She reports that she does not use drugs.  Past Medical History:  Diagnosis Date  . Anxiety   . Depression   . Endometriosis   . Epilepsy (HCC)    last seizure - years ago  . Epilepsy (HCC)   . GERD (gastroesophageal reflux disease)   . History of migraine headaches   . Seizures (HCC)    controlled with meds, last seizure 4-5 yrs ago    Past Surgical History:  Procedure Laterality Date  . CARPAL TUNNEL RELEASE Right 06/14/2015   Procedure: RIGHT CARPAL TUNNEL RELEASE ENDOSCOPIC;  Surgeon: Mack Hook, MD;  Location: [facility];  Service: Orthopedics;  Laterality: Right;  . COLONOSCOPY WITH PROPOFOL N/A 02/10/2013   Procedure: COLONOSCOPY WITH PROPOFOL;  Surgeon: Charolett Bumpers, MD;  Location: [facility];  Service: Endoscopy;  Laterality: N/A;  . CYSTOSCOPY N/A 05/14/2016   Procedure: CYSTOSCOPY;  Surgeon: Jerene Bears, MD;  Location: [facility];  Service: Gynecology;  Laterality: N/A;  . DIAGNOSTIC LAPAROSCOPY  12/15/2009   uterosacral nerve ablation  .  ELBOW SURGERY Left 2012  . LAPAROSCOPIC BILATERAL SALPINGECTOMY Bilateral 05/14/2016   Procedure: LAPAROSCOPIC BILATERAL SALPINGECTOMY;  Surgeon: Jerene Bears, MD;  Location: [facility];  Service: Gynecology;  Laterality: Bilateral;  . LAPAROSCOPIC HYSTERECTOMY N/A 05/14/2016   Procedure: HYSTERECTOMY TOTAL LAPAROSCOPIC;  Surgeon: Jerene Bears, MD;  Location: [facility];  Service: Gynecology;  Laterality: N/A;  . UMBILICAL HERNIA REPAIR     as an infant  . WISDOM TOOTH EXTRACTION    . YAG LASER APPLICATION  06/24/2006   vaporization of endometriosis    Current Outpatient Medications  Medication Sig Dispense Refill  . amitriptyline (ELAVIL) 50 MG tablet Take 100 mg by mouth at bedtime.     . clonazePAM (KLONOPIN) 0.5 MG tablet Take 0.5 mg by mouth 2 (two) times daily as needed for anxiety (or for seizures).     . Diclofenac Potassium 50 MG PACK Take 1 packet at earliest onset of HA.    . ibuprofen (ADVIL,MOTRIN) 200 MG tablet Take 800 mg by mouth every 6 (six) hours as needed for headache or moderate pain.    . Lactobacillus (PROBIOTIC ACIDOPHILUS PO) Take 1 capsule by mouth daily.     Marland Kitchen lamoTRIgine (LAMICTAL) 100 MG tablet Take 200 mg by mouth 2 (two) times daily.     Marland Kitchen loratadine (CLARITIN) 10 MG tablet Take 10 mg by mouth daily.     Marland Kitchen  Multiple Vitamin (MULTI-VITAMINS) TABS Take by mouth daily.    . Potassium 99 MG TABS Take 99 mg by mouth daily.     Marland Kitchen zonisamide (ZONEGRAN) 100 MG capsule Take 500 mg by mouth at bedtime.      No current facility-administered medications for this visit.     Family History  Problem Relation Age of Onset  . Hypertension Mother   . Diabetes Mother   . Irritable bowel syndrome Mother   . Hypertension Maternal Grandmother   . Diabetes Maternal Grandmother   . Other Father        bile duct cancer  . Irritable bowel syndrome Maternal Aunt     Review of Systems  All other systems reviewed and are negative.   Exam:   BP 110/70 (BP Location: Right Arm,  Patient Position: Sitting, Cuff Size: Large)   Pulse 88   Resp 16   Ht 5\' 5"  (1.651 m)   Wt 217 lb 12.8 oz (98.8 kg)   LMP 04/27/2016 (Approximate)   BMI 36.24 kg/m    Height: 5\' 5"  (165.1 cm)  Ht Readings from Last 3 Encounters:  08/19/18 5\' 5"  (1.651 m)  12/31/17 5' 5.25" (1.657 m)  10/10/17 5' 5.25" (1.657 m)    General appearance: alert, cooperative and appears stated age Head: Normocephalic, without obvious abnormality, atraumatic Neck: no adenopathy, supple, symmetrical, trachea midline and thyroid normal to inspection and palpation Lungs: clear to auscultation bilaterally Breasts: normal appearance, no masses or tenderness Heart: regular rate and rhythm Abdomen: soft, non-tender; bowel sounds normal; no masses,  no organomegaly Extremities: extremities normal, atraumatic, no cyanosis or edema Skin: Skin color, texture, turgor normal. No rashes or lesions Lymph nodes: Cervical, supraclavicular, and axillary nodes normal. No abnormal inguinal nodes palpated Neurologic: Grossly normal   Pelvic: External genitalia:  no lesions              Urethra:  normal appearing urethra with no masses, tenderness or lesions              Bartholins and Skenes: normal                 Vagina: normal appearing vagina with normal color and discharge, no lesions              Cervix: absent              Pap taken: No. Bimanual Exam:  Uterus:  uterus absent              Adnexa: no mass, fullness, tenderness               Rectovaginal: Confirms               Anus:  normal sphincter tone, no lesions  Chaperone was present for exam.  A:  Well Woman with normal exam S/p TLH/bilateral salpingectomy/cystoscopy 7/17 H/o chronic pelvic pain that resolved after surgery Migraines H/O epilepsy with increased seizure activity this year, recent EEG confirmed  P:   Mammogram guidelines reviewed.  Information about starting screening MMG given pap smear not indicated TSH, Vit D, HbA1C obtained today.   Did have lipid panel, per pt, last year with Dr. Barbaraann Barthel and this was normal Vaccines UTD return annually or prn

## 2018-08-20 LAB — HEMOGLOBIN A1C
Est. average glucose Bld gHb Est-mCnc: 105 mg/dL
Hgb A1c MFr Bld: 5.3 % (ref 4.8–5.6)

## 2018-08-20 LAB — VITAMIN D 25 HYDROXY (VIT D DEFICIENCY, FRACTURES): VIT D 25 HYDROXY: 25.2 ng/mL — AB (ref 30.0–100.0)

## 2018-08-20 LAB — TSH: TSH: 1.3 u[IU]/mL (ref 0.450–4.500)

## 2018-08-28 DIAGNOSIS — G43009 Migraine without aura, not intractable, without status migrainosus: Secondary | ICD-10-CM | POA: Diagnosis not present

## 2018-09-10 DIAGNOSIS — M79641 Pain in right hand: Secondary | ICD-10-CM | POA: Diagnosis not present

## 2018-09-30 ENCOUNTER — Ambulatory Visit: Payer: BLUE CROSS/BLUE SHIELD | Admitting: Obstetrics & Gynecology

## 2018-10-09 DIAGNOSIS — G40309 Generalized idiopathic epilepsy and epileptic syndromes, not intractable, without status epilepticus: Secondary | ICD-10-CM | POA: Diagnosis not present

## 2018-10-09 DIAGNOSIS — F419 Anxiety disorder, unspecified: Secondary | ICD-10-CM | POA: Diagnosis not present

## 2018-10-09 DIAGNOSIS — G43009 Migraine without aura, not intractable, without status migrainosus: Secondary | ICD-10-CM | POA: Diagnosis not present

## 2018-10-17 DIAGNOSIS — F411 Generalized anxiety disorder: Secondary | ICD-10-CM | POA: Diagnosis not present

## 2018-11-07 DIAGNOSIS — F411 Generalized anxiety disorder: Secondary | ICD-10-CM | POA: Diagnosis not present

## 2018-11-19 DIAGNOSIS — F411 Generalized anxiety disorder: Secondary | ICD-10-CM | POA: Diagnosis not present

## 2018-12-03 DIAGNOSIS — F411 Generalized anxiety disorder: Secondary | ICD-10-CM | POA: Diagnosis not present

## 2018-12-15 DIAGNOSIS — F411 Generalized anxiety disorder: Secondary | ICD-10-CM | POA: Diagnosis not present

## 2018-12-31 DIAGNOSIS — F411 Generalized anxiety disorder: Secondary | ICD-10-CM | POA: Diagnosis not present

## 2019-01-21 DIAGNOSIS — J22 Unspecified acute lower respiratory infection: Secondary | ICD-10-CM | POA: Diagnosis not present

## 2019-01-21 DIAGNOSIS — Z03818 Encounter for observation for suspected exposure to other biological agents ruled out: Secondary | ICD-10-CM | POA: Diagnosis not present

## 2019-01-21 DIAGNOSIS — J029 Acute pharyngitis, unspecified: Secondary | ICD-10-CM | POA: Diagnosis not present

## 2019-02-11 ENCOUNTER — Telehealth: Payer: Self-pay | Admitting: Obstetrics & Gynecology

## 2019-02-11 ENCOUNTER — Ambulatory Visit (INDEPENDENT_AMBULATORY_CARE_PROVIDER_SITE_OTHER): Payer: BLUE CROSS/BLUE SHIELD | Admitting: Obstetrics and Gynecology

## 2019-02-11 ENCOUNTER — Other Ambulatory Visit: Payer: Self-pay

## 2019-02-11 ENCOUNTER — Encounter: Payer: Self-pay | Admitting: Obstetrics and Gynecology

## 2019-02-11 VITALS — BP 122/80 | HR 84 | Temp 98.6°F | Ht 65.0 in | Wt 227.0 lb

## 2019-02-11 DIAGNOSIS — N939 Abnormal uterine and vaginal bleeding, unspecified: Secondary | ICD-10-CM | POA: Diagnosis not present

## 2019-02-11 DIAGNOSIS — N760 Acute vaginitis: Secondary | ICD-10-CM | POA: Diagnosis not present

## 2019-02-11 NOTE — Telephone Encounter (Signed)
Patient is having vaginal pain with discharge. Was having some bleeding but that has subsided.

## 2019-02-11 NOTE — Telephone Encounter (Signed)
Spoke with patient. Patient states that 1 week ago she had some spotting when she wiped. Thinks this came from her vaginal tissue but is unsure. Patient has had a hysterectomy. No bleeding since. Is having vaginal irritation and white discharge. Denies itching. Has not used any OTC products. Appointments scheduled for today at 2 pm with Dr.Silva. Patient is agreeable to date and time.  Routing to provider and will close encounter.

## 2019-02-11 NOTE — Patient Instructions (Signed)
Vaginitis  Vaginitis is a condition in which the vaginal tissue swells and becomes red (inflamed). This condition is most often caused by a change in the normal balance of bacteria and yeast that live in the vagina. This change causes an overgrowth of certain bacteria or yeast, which causes the inflammation. There are different types of vaginitis, but the most common types are:   Bacterial vaginosis.   Yeast infection (candidiasis).   Trichomoniasis vaginitis. This is a sexually transmitted disease (STD).   Viral vaginitis.   Atrophic vaginitis.   Allergic vaginitis.  What are the causes?  The cause of this condition depends on the type of vaginitis. It can be caused by:   Bacteria (bacterial vaginosis).   Yeast, which is a fungus (yeast infection).   A parasite (trichomoniasis vaginitis).   A virus (viral vaginitis).   Low hormone levels (atrophic vaginitis). Low hormone levels can occur during pregnancy, breastfeeding, or after menopause.   Irritants, such as bubble baths, scented tampons, and feminine sprays (allergic vaginitis).  Other factors can change the normal balance of the yeast and bacteria that live in the vagina. These include:   Antibiotic medicines.   Poor hygiene.   Diaphragms, vaginal sponges, spermicides, birth control pills, and intrauterine devices (IUD).   Sex.   Infection.   Uncontrolled diabetes.   A weakened defense (immune) system.  What increases the risk?  This condition is more likely to develop in women who:   Smoke.   Use vaginal douches, scented tampons, or scented sanitary pads.   Wear tight-fitting pants.   Wear thong underwear.   Use oral birth control pills or an IUD.   Have sex without a condom.   Have multiple sex partners.   Have an STD.   Frequently use the spermicide nonoxynol-9.   Eat lots of foods high in sugar.   Have uncontrolled diabetes.   Have low estrogen levels.   Have a weakened immune system from an immune disorder or medical  treatment.   Are pregnant or breastfeeding.  What are the signs or symptoms?  Symptoms vary depending on the cause of the vaginitis. Common symptoms include:   Abnormal vaginal discharge.  ? The discharge is white, gray, or yellow with bacterial vaginosis.  ? The discharge is thick, white, and cheesy with a yeast infection.  ? The discharge is frothy and yellow or greenish with trichomoniasis.   A bad vaginal smell. The smell is fishy with bacterial vaginosis.   Vaginal itching, pain, or swelling.   Sex that is painful.   Pain or burning when urinating.  Sometimes there are no symptoms.  How is this diagnosed?  This condition is diagnosed based on your symptoms and medical history. A physical exam, including a pelvic exam, will also be done. You may also have other tests, including:   Tests to determine the pH level (acidity or alkalinity) of your vagina.   A whiff test, to assess the odor that results when a sample of your vaginal discharge is mixed with a potassium hydroxide solution.   Tests of vaginal fluid. A sample will be examined under a microscope.  How is this treated?  Treatment varies depending on the type of vaginitis you have. Your treatment may include:   Antibiotic creams or pills to treat bacterial vaginosis and trichomoniasis.   Antifungal medicines, such as vaginal creams or suppositories, to treat a yeast infection.   Medicine to ease discomfort if you have viral vaginitis. Your sexual partner   should also be treated.   Estrogen delivered in a cream, pill, suppository, or vaginal ring to treat atrophic vaginitis. If vaginal dryness occurs, lubricants and moisturizing creams may help. You may need to avoid scented soaps, sprays, or douches.   Stopping use of a product that is causing allergic vaginitis. Then using a vaginal cream to treat the symptoms.  Follow these instructions at home:  Lifestyle   Keep your genital area clean and dry. Avoid soap, and only rinse the area with  water.   Do not douche or use tampons until your health care provider says it is okay to do so. Use sanitary pads, if needed.   Do not have sex until your health care provider approves. When you can return to sex, practice safe sex and use condoms.   Wipe from front to back. This avoids the spread of bacteria from the rectum to the vagina.  General instructions   Take over-the-counter and prescription medicines only as told by your health care provider.   If you were prescribed an antibiotic medicine, take or use it as told by your health care provider. Do not stop taking or using the antibiotic even if you start to feel better.   Keep all follow-up visits as told by your health care provider. This is important.  How is this prevented?   Use mild, non-scented products. Do not use things that can irritate the vagina, such as fabric softeners. Avoid the following products if they are scented:  ? Feminine sprays.  ? Detergents.  ? Tampons.  ? Feminine hygiene products.  ? Soaps or bubble baths.   Let air reach your genital area.  ? Wear cotton underwear to reduce moisture buildup.  ? Avoid wearing underwear while you sleep.  ? Avoid wearing tight pants and underwear or nylons without a cotton panel.  ? Avoid wearing thong underwear.   Take off any wet clothing, such as bathing suits, as soon as possible.   Practice safe sex and use condoms.  Contact a health care provider if:   You have abdominal pain.   You have a fever.   You have symptoms that last for more than 2-3 days.  Get help right away if:   You have a fever and your symptoms suddenly get worse.  Summary   Vaginitis is a condition in which the vaginal tissue becomes inflamed.This condition is most often caused by a change in the normal balance of bacteria and yeast that live in the vagina.   Treatment varies depending on the type of vaginitis you have.   Do not douche, use tampons , or have sex until your health care provider approves. When  you can return to sex, practice safe sex and use condoms.  This information is not intended to replace advice given to you by your health care provider. Make sure you discuss any questions you have with your health care provider.  Document Released: 08/12/2007 Document Revised: 11/20/2016 Document Reviewed: 11/20/2016  [employer]vier Interactive Patient Education  2019 [employer]

## 2019-02-11 NOTE — Progress Notes (Signed)
GYNECOLOGY  VISIT   HPI: 43 y.o.   Married  Caucasian  female   G0P0000 with Patient's last menstrual period was 04/27/2016 (approximate).   here for  Vaginal discharge and pain x 1 week.  States she had vaginal bleeding with wiping 5 - 7 days ago.  The next day it was resolved.  States she has checked with a mirror, and she notices the vaginal opening is red.  No lesion noted.  States she did have to strain to have a BM when she noted the blood.  Having vaginal stinging.  Not itching.  Some white discharge.  No odor.   No intercourse in the last month.  No instrumentation use.   No vaginal treatments.   No pad use.   BV in March 2019.   GYNECOLOGIC HISTORY: Patient's last menstrual period was 04/27/2016 (approximate). Contraception:  Hysterectomy  Menopausal hormone therapy:  none Last mammogram:  none Last pap smear:   04/24/16 Neg         OB History    Gravida  0   Para  0   Term  0   Preterm  0   AB  0   Living  0     SAB  0   TAB  0   Ectopic  0   Multiple  0   Live Births                 Patient Active Problem List   Diagnosis Date Noted  . Anxiety 12/14/2016  . Endometriosis 04/25/2016  . PCOS (polycystic ovarian syndrome) 03/19/2016  . Pelvic pain in female 03/19/2016  . Epilepsy with partial complex seizures (HCC) 01/21/2016  . Idiopathic generalized epilepsy (HCC) 12/06/2015  . Migraine without aura and responsive to treatment 12/06/2015    Past Medical History:  Diagnosis Date  . Anxiety   . Depression   . Endometriosis   . Epilepsy (HCC)    last seizure - years ago  . Epilepsy (HCC)   . GERD (gastroesophageal reflux disease)   . History of migraine headaches   . Seizures (HCC)    controlled with meds, last seizure 4-5 yrs ago    Past Surgical History:  Procedure Laterality Date  . CARPAL TUNNEL RELEASE Right 06/14/2015   Procedure: RIGHT CARPAL TUNNEL RELEASE ENDOSCOPIC;  Surgeon: Mack Hook, MD;  Location: [facility]  [place];  Service: Orthopedics;  Laterality: Right;  . COLONOSCOPY WITH PROPOFOL N/A 02/10/2013   Procedure: COLONOSCOPY WITH PROPOFOL;  Surgeon: Charolett Bumpers, MD;  [facility];  Service: Endoscopy;  Laterality: N/A;  . CYSTOSCOPY N/A 05/14/2016   Procedure: CYSTOSCOPY;  Surgeon: Jerene Bears, MD;  Location: [facility];  Service: Gynecology;  Laterality: N/A;  . DIAGNOSTIC LAPAROSCOPY  12/15/2009   uterosacral nerve ablation  . ELBOW SURGERY Left 2012  . LAPAROSCOPIC BILATERAL SALPINGECTOMY Bilateral 05/14/2016   Procedure: LAPAROSCOPIC BILATERAL SALPINGECTOMY;  Surgeon: Jerene Bears, MD;  Location: [facility];  Service: Gynecology;  Laterality: Bilateral;  . LAPAROSCOPIC HYSTERECTOMY N/A 05/14/2016   Procedure: HYSTERECTOMY TOTAL LAPAROSCOPIC;  Surgeon: Jerene Bears, MD;  Location[facility];  Service: Gynecology;  Laterality: N/A;  . UMBILICAL HERNIA REPAIR     as an infant  . WISDOM TOOTH EXTRACTION    . YAG LASER APPLICATION  06/24/2006   vaporization of endometriosis    Current Outpatient Medications  Medication Sig Dispense Refill  . amitriptyline (ELAVIL) 50 MG tablet Take 100 mg by mouth at  bedtime.     . clonazePAM (KLONOPIN) 0.5 MG tablet Take 0.5 mg by mouth 2 (two) times daily as needed for anxiety (or for seizures).     Marland Kitchen ibuprofen (ADVIL,MOTRIN) 200 MG tablet Take 800 mg by mouth every 6 (six) hours as needed for headache or moderate pain.    . Lactobacillus (PROBIOTIC ACIDOPHILUS PO) Take 1 capsule by mouth daily.     Marland Kitchen lamoTRIgine (LAMICTAL) 100 MG tablet Take 200 mg by mouth 2 (two) times daily.     Marland Kitchen loratadine (CLARITIN) 10 MG tablet Take 10 mg by mouth daily.     . Multiple Vitamin (MULTI-VITAMINS) TABS Take by mouth daily.    . Potassium 99 MG TABS Take 99 mg by mouth daily.     Marland Kitchen zonisamide (ZONEGRAN) 100 MG capsule Take 500 mg by mouth at bedtime.      No current facility-administered medications for this visit.      ALLERGIES: Wellbutrin  [bupropion hcl]; Escitalopram oxalate; and Topiramate  Family History  Problem Relation Age of Onset  . Hypertension Mother   . Diabetes Mother   . Irritable bowel syndrome Mother   . Hypertension Maternal Grandmother   . Diabetes Maternal Grandmother   . Other Father        bile duct cancer  . Irritable bowel syndrome Maternal Aunt     Social History   Socioeconomic History  . Marital status: Married    Spouse name: Not on file  . Number of children: 0  . Years of education: Not on file  . Highest education level: Not on file  Occupational History  . Occupation: Film/video editor in [employer]  Social Needs  . Financial resource strain: Not on file  . Food insecurity:    Worry: Not on file    Inability: Not on file  . Transportation needs:    Medical: Not on file    Non-medical: Not on file  Tobacco Use  . Smoking status: Never Smoker  . Smokeless tobacco: Never Used  Substance and Sexual Activity  . Alcohol use: Yes    Alcohol/week: 2.0 standard drinks    Types: 2 Standard drinks or equivalent per week  . Drug use: No  . Sexual activity: Yes    Partners: Male    Birth control/protection: Surgical    Comment: husband vasectomy/ Hysterectomy  Lifestyle  . Physical activity:    Days per week: Not on file    Minutes per session: Not on file  . Stress: Not on file  Relationships  . Social connections:    Talks on phone: Not on file    Gets together: Not on file    Attends religious service: Not on file    Active member of club or organization: Not on file    Attends meetings of clubs or organizations: Not on file    Relationship status: Not on file  . Intimate partner violence:    Fear of current or ex partner: Not on file    Emotionally abused: Not on file    Physically abused: Not on file    Forced sexual activity: Not on file  Other Topics Concern  . Not on file  Social History Narrative  . Not on file    Review of Systems  Genitourinary:  Positive for vaginal discharge and vaginal pain.  All other systems reviewed and are negative.   PHYSICAL EXAMINATION:    BP 122/80   Pulse 84   Temp 98.6  F (37 C) (Oral)   Ht 5\' 5"  (1.651 m)   Wt 227 lb (103 kg)   LMP 04/27/2016 (Approximate)   BMI 37.77 kg/m     General appearance: alert, cooperative and appears stated age   Pelvic: External genitalia:  no lesions              Urethra:  normal appearing urethra with no masses, tenderness or lesions              Bartholins and Skenes: normal                 Vagina: normal appearing vagina with normal color and thin white discharge, no lesions              Cervix: absent                Bimanual Exam:  Uterus:  absent              Adnexa: no mass, fullness, tenderness        Chaperone was present for exam.  ASSESSMENT  Status post TLH, bilateral salpingectomy.  Ovaries remain.  Vaginal bleeding? No lesion seen.   Bleeding related to constipation? Vaginitis.  PLAN  Vaginitis discussed.  Affirm.  States she can wait for resutls to treat.    An After Visit Summary was printed and given to the patient.  ___15__ minutes face to face time of which over 50% was spent in counseling.

## 2019-02-12 LAB — VAGINITIS/VAGINOSIS, DNA PROBE
Candida Species: NEGATIVE
Gardnerella vaginalis: POSITIVE — AB
Trichomonas vaginosis: NEGATIVE

## 2019-02-13 ENCOUNTER — Telehealth: Payer: Self-pay

## 2019-02-13 MED ORDER — METRONIDAZOLE 0.75 % VA GEL: 1.0000 | Freq: Every day | VAGINAL | 0 refills | Status: DC

## 2019-02-13 NOTE — Telephone Encounter (Signed)
Spoke with patient. Results given. Patient verbalizes understanding. Rx for Metrogel pv hs for 5 nights sent to pharmacy on file. Encounter closed.

## 2019-02-13 NOTE — Telephone Encounter (Signed)
-----   Message from Patton Salles, MD sent at 02/12/2019 11:18 AM EDT ----- Please inform of Affirm result showing bacterial vaginosis. She may treat with Flagyl 500 mg po bid for 7 days or Metrogel pv at hs for 5 nights.  Please send Rx to pharmacy of choice. ETOH precautions.

## 2019-06-25 ENCOUNTER — Other Ambulatory Visit: Payer: Self-pay | Admitting: Obstetrics & Gynecology

## 2019-06-25 DIAGNOSIS — Z1231 Encounter for screening mammogram for malignant neoplasm of breast: Secondary | ICD-10-CM

## 2019-07-22 DIAGNOSIS — H5212 Myopia, left eye: Secondary | ICD-10-CM | POA: Diagnosis not present

## 2019-07-28 DIAGNOSIS — G40309 Generalized idiopathic epilepsy and epileptic syndromes, not intractable, without status epilepticus: Secondary | ICD-10-CM | POA: Diagnosis not present

## 2019-07-28 DIAGNOSIS — G43009 Migraine without aura, not intractable, without status migrainosus: Secondary | ICD-10-CM | POA: Diagnosis not present

## 2019-07-28 DIAGNOSIS — M542 Cervicalgia: Secondary | ICD-10-CM | POA: Diagnosis not present

## 2019-07-28 DIAGNOSIS — G4700 Insomnia, unspecified: Secondary | ICD-10-CM | POA: Diagnosis not present

## 2019-08-05 DIAGNOSIS — R52 Pain, unspecified: Secondary | ICD-10-CM | POA: Diagnosis not present

## 2019-08-05 DIAGNOSIS — R05 Cough: Secondary | ICD-10-CM | POA: Diagnosis not present

## 2019-08-05 DIAGNOSIS — J029 Acute pharyngitis, unspecified: Secondary | ICD-10-CM | POA: Diagnosis not present

## 2019-08-10 ENCOUNTER — Ambulatory Visit: Payer: BLUE CROSS/BLUE SHIELD

## 2019-08-27 DIAGNOSIS — F331 Major depressive disorder, recurrent, moderate: Secondary | ICD-10-CM | POA: Diagnosis not present

## 2019-08-27 DIAGNOSIS — F411 Generalized anxiety disorder: Secondary | ICD-10-CM | POA: Diagnosis not present

## 2019-09-15 ENCOUNTER — Ambulatory Visit
Admission: RE | Admit: 2019-09-15 | Discharge: 2019-09-15 | Disposition: A | Discharge status: Home / Self Care | Payer: BC Managed Care – PPO | Source: Ambulatory Visit | Attending: Obstetrics & Gynecology | Admitting: Obstetrics & Gynecology

## 2019-09-15 ENCOUNTER — Other Ambulatory Visit: Payer: Self-pay

## 2019-09-15 DIAGNOSIS — Z1231 Encounter for screening mammogram for malignant neoplasm of breast: Secondary | ICD-10-CM | POA: Diagnosis not present

## 2019-10-19 DIAGNOSIS — F419 Anxiety disorder, unspecified: Secondary | ICD-10-CM | POA: Diagnosis not present

## 2019-10-19 DIAGNOSIS — G40309 Generalized idiopathic epilepsy and epileptic syndromes, not intractable, without status epilepticus: Secondary | ICD-10-CM | POA: Diagnosis not present

## 2019-10-19 DIAGNOSIS — R413 Other amnesia: Secondary | ICD-10-CM | POA: Diagnosis not present

## 2019-10-19 DIAGNOSIS — G43009 Migraine without aura, not intractable, without status migrainosus: Secondary | ICD-10-CM | POA: Diagnosis not present

## 2019-10-24 ENCOUNTER — Telehealth: Payer: BC Managed Care – PPO | Admitting: Nurse Practitioner

## 2019-10-24 DIAGNOSIS — Z20822 Contact with and (suspected) exposure to covid-19: Secondary | ICD-10-CM

## 2019-10-24 DIAGNOSIS — R05 Cough: Secondary | ICD-10-CM

## 2019-10-24 DIAGNOSIS — R059 Cough, unspecified: Secondary | ICD-10-CM

## 2019-10-24 DIAGNOSIS — R519 Headache, unspecified: Secondary | ICD-10-CM

## 2019-10-24 DIAGNOSIS — J029 Acute pharyngitis, unspecified: Secondary | ICD-10-CM

## 2019-10-24 MED ORDER — BENZONATATE 100 MG PO CAPS: 100.0000 mg | ORAL_CAPSULE | Freq: Three times a day (TID) | ORAL | 0 refills | Status: DC | PRN

## 2019-10-24 NOTE — Progress Notes (Signed)
E-Visit for Corona Virus Screening   Your current symptoms could be consistent with the coronavirus.  Many health care providers can now test patients at their office but not all are.  [facility] has multiple testing sites. For information on our COVID testing locations and hours go to [email or web address]  We are enrolling you in our MyChart Home Monitoring for COVID19 . Daily you will receive a questionnaire within the MyChart website. Our COVID 19 response team will be monitoring your responses daily.  Testing Information: The COVID-19 Community Testing sites will begin testing BY APPOINTMENT ONLY.  You can schedule online at [email or web address]  If you do not have access to a smart phone or computer you may call [phone] for an appointment.  Testing Locations: Appointment schedule is 8 am to 3:30 pm at all sites  [place] indoors at [address], [place] [address] [facility]  indoors at [address], [place], [place] [address] [place] indoors at [address], [place] [address]  Additional testing sites in the Community:  . For [facility] Testing sites in [place]  https://www.cvs.com/minuteclinic/covid-19-testing  . For Pop-up testing sites in [place]  https://covid19.ncdhhs.gov/about-covid-19/testing/find-my-testing-place/pop-testing-sites  . For Testing sites with regular hours https://onsms.org/[place]/  . For [place] [facility] https://[facility].com/covid-19-community-outreach-testing/  . For [facility] https://www.[place].gov/[place]ounty/human-services/health-department/coronavirus-covid-19-info/covid-19-testing  . For [place] testing in [place] https://www.[place].gov/[place]-county/human-services/health-department/coronavirus-covid-19-info/covid-19-testing  . For [facility] testing in [place]   https://lhi.care/covidtesting  For  more  information about community testing call [phone]   We are enrolling you in our MyChart Home Monitoring for COVID19 . Daily you will receive a questionnaire within the MyChart website. Our COVID 19 response team will be monitoring your responses daily.  Please quarantine yourself while awaiting your test results. If you develop fever/cough/breathlessness, please stay home for 10 days with improving symptoms and until you have had 24 hours of no fever (without taking a fever reducer).  You should wear a mask or cloth face covering over your nose and mouth if you must be around other people or animals, including pets (even at home). Try to stay at least 6 feet away from other people. This will protect the people around you.  Please continue good preventive care measures, including:  frequent hand-washing, avoid touching your face, cover coughs/sneezes, stay out of crowds and keep a 6 foot distance from others.  COVID-19 is a respiratory illness with symptoms that are similar to the flu. Symptoms are typically mild to moderate, but there have been cases of severe illness and death due to the virus.   The following symptoms may appear 2-14 days after exposure: . Fever . Cough . Shortness of breath or difficulty breathing . Chills . Repeated shaking with chills . Muscle pain . Headache . Sore throat . New loss of taste or smell . Fatigue . Congestion or runny nose . Nausea or vomiting . Diarrhea  Go to the nearest hospital ED for assessment if fever/cough/breathlessness are severe or illness seems like a threat to life.  It is vitally important that if you feel that you have an infection such as this virus or any other virus that you stay home and away from places where you may spread it to others.  You should avoid contact with people age 65 and older.   You can use medication such as A prescription cough medication called Tessalon Perles 100 mg. You may take 1-2 capsules every 8 hours as    needed for cough  You may also take acetaminophen (Tylenol) as needed for fever.  Reduce your risk of any infection by using the same precautions used for avoiding the common cold or flu:  . Wash your hands often with soap and warm water for at least 20 seconds.  If soap and water are not readily available, use an alcohol-based hand sanitizer with at least 60% alcohol.  . If coughing or sneezing, cover your mouth and nose by coughing or sneezing into the elbow areas of your shirt or coat, into a tissue or into your sleeve (not your hands). . Avoid shaking hands with others and consider head nods or verbal greetings only. . Avoid touching your eyes, nose, or mouth with unwashed hands.  . Avoid close contact with people who are sick. . Avoid places or events with large numbers of people in one location, like concerts or sporting events. . Carefully consider travel plans you have or are making. . If you are planning any travel outside or inside the [place], visit the CDC's Travelers' Health webpage for the latest health notices. . If you have some symptoms but not all symptoms, continue to monitor at home and seek medical attention if your symptoms worsen. . If you are having a medical emergency, call 911.  HOME CARE . Only take medications as instructed by your medical team. . Drink plenty of fluids and get plenty of rest. . A steam or ultrasonic humidifier can help if you have congestion.   GET HELP RIGHT AWAY IF YOU HAVE EMERGENCY WARNING SIGNS** FOR COVID-19. If you or someone is showing any of these signs seek emergency medical care immediately. Call 911 or proceed to your closest emergency facility if: . You develop worsening high fever. . Trouble breathing . Bluish lips or face . Persistent pain or pressure in the chest . New confusion . Inability to wake or stay awake . You cough up blood. . Your symptoms become more severe  **This list is not all possible symptoms. Contact your  medical provider for any symptoms that are sever or concerning to you.  MAKE SURE YOU   Understand these instructions.  Will watch your condition.  Will get help right away if you are not doing well or get worse.  Your e-visit answers were reviewed by a board certified advanced clinical practitioner to complete your personal care plan.  Depending on the condition, your plan could have included both over the counter or prescription medications.  If there is a problem please reply once you have received a response from your provider.  Your safety is important to us.  If you have drug allergies check your prescription carefully.    You can use MyChart to ask questions about today's visit, request a non-urgent call back, or ask for a work or school excuse for 24 hours related to this e-Visit. If it has been greater than 24 hours you will need to follow up with your provider, or enter a new e-Visit to address those concerns. You will get an e-mail in the next two days asking about your experience.  I hope that your e-visit has been valuable and will speed your recovery. Thank you for using e-visits.   5-10 minutes spent reviewing and documenting in chart.  

## 2019-10-26 ENCOUNTER — Ambulatory Visit: Payer: BC Managed Care – PPO | Attending: Internal Medicine

## 2019-10-26 DIAGNOSIS — Z20822 Contact with and (suspected) exposure to covid-19: Secondary | ICD-10-CM

## 2019-10-26 DIAGNOSIS — Z20828 Contact with and (suspected) exposure to other viral communicable diseases: Secondary | ICD-10-CM | POA: Diagnosis not present

## 2019-10-28 LAB — NOVEL CORONAVIRUS, NAA: SARS-CoV-2, NAA: NOT DETECTED

## 2019-10-29 DIAGNOSIS — J0100 Acute maxillary sinusitis, unspecified: Secondary | ICD-10-CM | POA: Diagnosis not present

## 2019-11-23 ENCOUNTER — Ambulatory Visit: Payer: BC Managed Care – PPO | Attending: Internal Medicine

## 2019-11-23 DIAGNOSIS — Z20822 Contact with and (suspected) exposure to covid-19: Secondary | ICD-10-CM

## 2019-11-24 LAB — NOVEL CORONAVIRUS, NAA: SARS-CoV-2, NAA: NOT DETECTED

## 2019-11-25 ENCOUNTER — Other Ambulatory Visit: Payer: Self-pay

## 2019-11-27 ENCOUNTER — Encounter: Payer: Self-pay | Admitting: Obstetrics & Gynecology

## 2019-11-27 ENCOUNTER — Other Ambulatory Visit: Payer: Self-pay

## 2019-11-27 ENCOUNTER — Ambulatory Visit: Payer: BC Managed Care – PPO | Admitting: Obstetrics & Gynecology

## 2019-11-27 VITALS — BP 118/70 | HR 88 | Temp 97.5°F | Resp 10 | Ht 65.5 in | Wt 220.0 lb

## 2019-11-27 DIAGNOSIS — N898 Other specified noninflammatory disorders of vagina: Secondary | ICD-10-CM | POA: Diagnosis not present

## 2019-11-27 DIAGNOSIS — Z01419 Encounter for gynecological examination (general) (routine) without abnormal findings: Secondary | ICD-10-CM | POA: Diagnosis not present

## 2019-11-27 NOTE — Progress Notes (Signed)
44 y.o. G0P0000 Married White or Caucasian female here for annual exam. Patient would like to discuss vaginal dryness and vaginal pain.  Had a single episode of vaginal bleeding this past year.  Thinks at least a few months.  They do use lubrication.  Painful with insertion and then uncomfortable with actual act of intercourse.    Seeing new neurologist, Dr. Joesph July.  She is planning an admission and taper off medications.  Then they will induce seizures to actually determine the type of seizures she has to help with more accurate treatment.    Patient's last menstrual period was 04/27/2016 (approximate).          Sexually active: Yes.    The current method of family planning is status post hysterectomy.    Exercising: No.  The patient does not participate in regular exercise at present. Smoker:  no  Health Maintenance: Pap:  04/24/16 Neg  07/02/14 Neg History of abnormal Pap:  no MMG:  09/15/19 BIRADS 1 negative/density b Colonoscopy:  02/10/13 Normal. Repeat age 70 TDaP:  2015 Screening Labs: does not need today   reports that she has never smoked. She has never used smokeless tobacco. She reports current alcohol use of about 2.0 standard drinks of alcohol per week. She reports that she does not use drugs.  Past Medical History:  Diagnosis Date  . Anxiety   . Depression   . Endometriosis   . Epilepsy ([place])    last seizure - years ago  . Epilepsy ([place])   . GERD (gastroesophageal reflux disease)   . History of migraine headaches   . Seizures ([place])    controlled with meds, last seizure 4-5 yrs ago    Past Surgical History:  Procedure Laterality Date  . CARPAL TUNNEL RELEASE Right 06/14/2015   Procedure: RIGHT CARPAL TUNNEL RELEASE ENDOSCOPIC;  Surgeon: Milly Jakob, MD;  Location: [facility];  Service: Orthopedics;  Laterality: Right;  . COLONOSCOPY WITH PROPOFOL N/A 02/10/2013   Procedure: COLONOSCOPY WITH PROPOFOL;  Surgeon: Garlan Fair, MD;  [facility];  Service: Endoscopy;  Laterality: N/A;  . CYSTOSCOPY N/A 05/14/2016   Procedure: CYSTOSCOPY;  Surgeon: Megan Salon, MD;  Location: [facility];  Service: Gynecology;  Laterality: N/A;  . DIAGNOSTIC LAPAROSCOPY  12/15/2009   uterosacral nerve ablation  . ELBOW SURGERY Left 2012  . LAPAROSCOPIC BILATERAL SALPINGECTOMY Bilateral 05/14/2016   Procedure: LAPAROSCOPIC BILATERAL SALPINGECTOMY;  Surgeon: Megan Salon, MD;  Location: [facility];  Service: Gynecology;  Laterality: Bilateral;  . LAPAROSCOPIC HYSTERECTOMY N/A 05/14/2016   Procedure: HYSTERECTOMY TOTAL LAPAROSCOPIC;  Surgeon: Megan Salon, MD;  Location: [facility];  Service: Gynecology;  Laterality: N/A;  . UMBILICAL HERNIA REPAIR     as an infant  . WISDOM TOOTH EXTRACTION    . YAG LASER APPLICATION  5/63/8756   vaporization of endometriosis    Current Outpatient Medications  Medication Sig Dispense Refill  . Cholecalciferol (D3 VITAMIN PO)     . ibuprofen (ADVIL,MOTRIN) 200 MG tablet Take 800 mg by mouth as needed for headache or moderate pain.     . Lactobacillus (PROBIOTIC ACIDOPHILUS PO) Take 1 capsule by mouth daily.     Marland Kitchen lamoTRIgine (LAMICTAL) 100 MG tablet Take 200 mg by mouth 2 (two) times daily.     Marland Kitchen loratadine (CLARITIN) 10 MG tablet Take 10 mg by mouth daily.     . Potassium 99 MG TABS Take 99 mg by mouth daily.     Marland Kitchen  vitamin E 180 MG (400 UNITS) capsule daily.    Marland Kitchen zonisamide (ZONEGRAN) 100 MG capsule Take 500 mg by mouth at bedtime.      No current facility-administered medications for this visit.    Family History  Problem Relation Age of Onset  . Hypertension Mother   . Diabetes Mother   . Irritable bowel syndrome Mother   . Hypertension Maternal Grandmother   . Diabetes Maternal Grandmother   . Other Father        bile duct cancer  . Irritable bowel syndrome Maternal Aunt     Review of Systems  Genitourinary: Positive for vaginal pain.  All other systems reviewed and are negative.   Exam:   BP  118/70 (BP Location: Right Arm, Patient Position: Sitting, Cuff Size: Large)   Pulse 88   Temp (!) 97.5 F (36.4 C) (Temporal)   Resp 10   Ht 5' 5.5" (1.664 m)   Wt 220 lb (99.8 kg)   LMP 04/27/2016 (Approximate)   BMI 36.05 kg/m   Height: 5' 5.5" (166.4 cm)  Ht Readings from Last 3 Encounters:  11/27/19 5' 5.5" (1.664 m)  02/11/19 5\' 5"  (1.651 m)  08/19/18 5\' 5"  (1.651 m)    General appearance: alert, cooperative and appears stated age Head: Normocephalic, without obvious abnormality, atraumatic Neck: no adenopathy, supple, symmetrical, trachea midline and thyroid normal to inspection and palpation Lungs: clear to auscultation bilaterally Breasts: normal appearance, no masses or tenderness Heart: regular rate and rhythm Abdomen: soft, non-tender; bowel sounds normal; no masses,  no organomegaly Extremities: extremities normal, atraumatic, no cyanosis or edema Skin: Skin color, texture, turgor normal. No rashes or lesions Lymph nodes: Cervical, supraclavicular, and axillary nodes normal. No abnormal inguinal nodes palpated Neurologic: Grossly normal   Pelvic: External genitalia:  no lesions              Urethra:  normal appearing urethra with no masses, tenderness or lesions              Bartholins and Skenes: normal                 Vagina: normal appearing vagina with whitish discharge, no lesions              Cervix: absent              Pap taken: No. Bimanual Exam:  Uterus:  uterus absent              Adnexa: no mass, fullness, tenderness               Rectovaginal: Confirms               Anus:  normal sphincter tone, no lesions  Chaperone, 08/21/18, CMA, was present for exam.  A:  Well Woman with normal exam H/o chronic pelvic pain with resolution after TLH/bilateral salpingectomy/cystoscopy 05/14/16.  Did see pelvic PT.  Pt feels current pain is different H/o migraines Epilepsy, having admission next week for taper off medicatoins Vaginal discharge  P:    Mammogram guidelines reviewed.   pap smear not indicated No blood work obtained today Tdap UTD in 2015 Colonoscopy guidelines reviewed Affirm obtained today.  If negative, will treat with vaginal estrogen cream Return annually or prn

## 2019-11-28 LAB — VAGINITIS/VAGINOSIS, DNA PROBE
Candida Species: NEGATIVE
Gardnerella vaginalis: POSITIVE — AB
Trichomonas vaginosis: NEGATIVE

## 2019-11-30 DIAGNOSIS — Z79899 Other long term (current) drug therapy: Secondary | ICD-10-CM | POA: Diagnosis not present

## 2019-11-30 DIAGNOSIS — G40309 Generalized idiopathic epilepsy and epileptic syndromes, not intractable, without status epilepticus: Secondary | ICD-10-CM | POA: Diagnosis not present

## 2019-11-30 DIAGNOSIS — R569 Unspecified convulsions: Secondary | ICD-10-CM | POA: Diagnosis not present

## 2019-11-30 DIAGNOSIS — G40409 Other generalized epilepsy and epileptic syndromes, not intractable, without status epilepticus: Secondary | ICD-10-CM | POA: Diagnosis not present

## 2019-11-30 DIAGNOSIS — G40909 Epilepsy, unspecified, not intractable, without status epilepticus: Secondary | ICD-10-CM | POA: Diagnosis not present

## 2019-12-07 ENCOUNTER — Other Ambulatory Visit: Payer: Self-pay

## 2019-12-07 MED ORDER — METRONIDAZOLE 500 MG PO TABS: 500.0000 mg | ORAL_TABLET | Freq: Two times a day (BID) | ORAL | 0 refills | Status: AC

## 2019-12-11 DIAGNOSIS — F419 Anxiety disorder, unspecified: Secondary | ICD-10-CM | POA: Diagnosis not present

## 2019-12-14 ENCOUNTER — Telehealth: Payer: Self-pay

## 2019-12-14 ENCOUNTER — Encounter: Payer: Self-pay | Admitting: Obstetrics & Gynecology

## 2019-12-14 NOTE — Telephone Encounter (Signed)
Pt sent Mychart message:   Hi Dr. Hyacinth Meeker, When did you want me to schedule my follow up appointment after I finished my last dose of flagyl? Thank you, April Campbell to pt. Pt needing appt for 3 weeks per Dr Rondel Baton lab note from [facility] testing. Pt states took last Flagyl today 2/15. Pt scheduled for 3 week recheck on 01/01/2020 at 4:30pm per pts request due to seizure and cant drive and has husband's schedule for 3/5. Pt agreeable to date and time.   Routing to Dr Hyacinth Meeker for review and will close encounter.

## 2019-12-21 DIAGNOSIS — F419 Anxiety disorder, unspecified: Secondary | ICD-10-CM | POA: Diagnosis not present

## 2019-12-21 DIAGNOSIS — G40909 Epilepsy, unspecified, not intractable, without status epilepticus: Secondary | ICD-10-CM | POA: Diagnosis not present

## 2019-12-22 ENCOUNTER — Encounter: Payer: Self-pay | Admitting: Neurology

## 2019-12-28 DIAGNOSIS — F410 Panic disorder [episodic paroxysmal anxiety] without agoraphobia: Secondary | ICD-10-CM | POA: Diagnosis not present

## 2019-12-28 DIAGNOSIS — F321 Major depressive disorder, single episode, moderate: Secondary | ICD-10-CM | POA: Diagnosis not present

## 2020-01-01 ENCOUNTER — Other Ambulatory Visit: Payer: Self-pay

## 2020-01-01 ENCOUNTER — Other Ambulatory Visit: Payer: Self-pay | Admitting: Obstetrics & Gynecology

## 2020-01-01 ENCOUNTER — Ambulatory Visit: Payer: BC Managed Care – PPO | Admitting: Obstetrics & Gynecology

## 2020-01-01 ENCOUNTER — Encounter: Payer: Self-pay | Admitting: Obstetrics & Gynecology

## 2020-01-01 VITALS — BP 110/64 | HR 84 | Temp 96.8°F | Resp 10 | Ht 65.5 in | Wt 220.0 lb

## 2020-01-01 DIAGNOSIS — N898 Other specified noninflammatory disorders of vagina: Secondary | ICD-10-CM | POA: Diagnosis not present

## 2020-01-01 DIAGNOSIS — R4589 Other symptoms and signs involving emotional state: Secondary | ICD-10-CM | POA: Diagnosis not present

## 2020-01-01 DIAGNOSIS — G40209 Localization-related (focal) (partial) symptomatic epilepsy and epileptic syndromes with complex partial seizures, not intractable, without status epilepticus: Secondary | ICD-10-CM | POA: Diagnosis not present

## 2020-01-01 NOTE — Patient Instructions (Addendum)
[facility] [address], [place], [place] [address] [phone]

## 2020-01-01 NOTE — Progress Notes (Signed)
GYNECOLOGY  VISIT  CC:   Patient complains of still having vaginal discomfort   HPI: 44 y.o. April Campbell Married White or Caucasian female here for recheck of vaginal irritation/discomfort.  She was diagnosed with BV and was treated however the treatment was delayed as she was hospitalized in early February for cessation of Lamictal to further evaluate her h/o seizure activity.  This was planned and we knew prior to the testing that she would be unable to have outpatient treatment for any vaginitis for at least a week or more after the treatment.  Once she was discharged, she did complete the medication but continues to have irritation.  She has not had any vaginal bleeding.  Denies vaginal odor.    Upon asking, the reports the neurology hospitalization was just terrible.  She didn't understand that the Lamictal would be abruptly stopped in the manner that it was.    Was hospitalized in early February.  This was stopped abruptly in the hospital for 72 hours.  She didn't immediately have any side effects from stopping the medication.  Her epilepsy was reconfirmed but no additional information was obtained from the testing, per her hx.  After a few days, she really felt the effects of stopping the medication.  She was restarted on 100mg  daily and is now on 200mg  daily.  She was previously on 200mg  BID.  She's had a really bad experience with the provider at [facility] who took care of her as well as the support staff.  She feels emotional and cries easily.  She was told this was all psychological and she needed to see a psychiatrist.  She felt blown off and that her symptoms were not taken seriously.  Her trust has been broken and she does have appt with Dr. Delice Lesch but it is not until April.  Feels like she is in limbo right now waiting for a new appt but also not knowing what to do to feel better.    She does has an appt with psychiatry, March 18, as well.     GYNECOLOGIC HISTORY: Patient's last menstrual  period was 04/27/2016 (approximate). Contraception: Hysterectomy Menopausal hormone therapy:  none  Patient Active Problem List   Diagnosis Date Noted  . Anxiety 12/14/2016  . Endometriosis 04/25/2016  . PCOS (polycystic ovarian syndrome) 03/19/2016  . Pelvic pain in female 03/19/2016  . Epilepsy with partial complex seizures ([place]) 01/21/2016  . Idiopathic generalized epilepsy ([place]) 12/06/2015  . Migraine without aura and responsive to treatment 12/06/2015    Past Medical History:  Diagnosis Date  . Anxiety   . Depression   . Endometriosis   . Epilepsy ([place])    last seizure - years ago  . Epilepsy ([place])   . GERD (gastroesophageal reflux disease)   . History of migraine headaches   . Seizures ([place])    controlled with meds, last seizure 4-5 yrs ago    Past Surgical History:  Procedure Laterality Date  . CARPAL TUNNEL RELEASE Right 06/14/2015   Procedure: RIGHT CARPAL TUNNEL RELEASE ENDOSCOPIC;  Surgeon: Milly Jakob, MD;  Location: [place];  Service: Orthopedics;  Laterality: Right;  . COLONOSCOPY WITH PROPOFOL N/A 02/10/2013   Procedure: COLONOSCOPY WITH PROPOFOL;  Surgeon: Garlan Fair, MD;  Location: [facility];  Service: Endoscopy;  Laterality: N/A;  . CYSTOSCOPY N/A 05/14/2016   Procedure: CYSTOSCOPY;  Surgeon: Megan Salon, MD;  Location: [facility];  Service: Gynecology;  Laterality: N/A;  . DIAGNOSTIC LAPAROSCOPY  12/15/2009  uterosacral nerve ablation  . ELBOW SURGERY Left 2012  . LAPAROSCOPIC BILATERAL SALPINGECTOMY Bilateral 05/14/2016   Procedure: LAPAROSCOPIC BILATERAL SALPINGECTOMY;  Surgeon: Jerene Bears, MD;  Location: [facility];  Service: Gynecology;  Laterality: Bilateral;  . LAPAROSCOPIC HYSTERECTOMY N/A 05/14/2016   Procedure: HYSTERECTOMY TOTAL LAPAROSCOPIC;  Surgeon: Jerene Bears, MD;  Location: [facility];  Service: Gynecology;  Laterality: N/A;  . UMBILICAL HERNIA REPAIR     as an infant  . WISDOM TOOTH EXTRACTION    . YAG LASER  APPLICATION  06/24/2006   vaporization of endometriosis    MEDS:   Current Outpatient Medications on File Prior to Visit  Medication Sig Dispense Refill  . Cholecalciferol (D3 VITAMIN PO)     . clonazePAM (KLONOPIN) 0.5 MG tablet     . ibuprofen (ADVIL,MOTRIN) 200 MG tablet Take 800 mg by mouth as needed for headache or moderate pain.     . Lactobacillus (PROBIOTIC ACIDOPHILUS PO) Take 1 capsule by mouth daily.     Marland Kitchen lamoTRIgine (LAMICTAL) 100 MG tablet Take 200 mg by mouth daily.     Marland Kitchen loratadine (CLARITIN) 10 MG tablet Take 10 mg by mouth daily.     . Potassium 99 MG TABS Take 99 mg by mouth daily.     . vitamin E 180 MG (400 UNITS) capsule daily.    Marland Kitchen zonisamide (ZONEGRAN) 100 MG capsule Take 500 mg by mouth at bedtime.      No current facility-administered medications on file prior to visit.    ALLERGIES: Wellbutrin [bupropion hcl], Escitalopram oxalate, and Topiramate  Family History  Problem Relation Age of Onset  . Hypertension Mother   . Diabetes Mother   . Irritable bowel syndrome Mother   . Hypertension Maternal Grandmother   . Diabetes Maternal Grandmother   . Other Father        bile duct cancer  . Irritable bowel syndrome Maternal Aunt     SH:  Married, non smoker  Review of Systems  Genitourinary:       Vaginal discomfort  All other systems reviewed and are negative.   PHYSICAL EXAMINATION:    BP 110/64 (BP Location: Right Arm, Patient Position: Sitting, Cuff Size: Normal)   Pulse 84   Temp (!) 96.8 F (36 C) (Temporal)   Resp 10   Ht 5' 5.5" (1.664 m)   Wt 220 lb (99.8 kg)   LMP 04/27/2016 (Approximate)   BMI 36.05 kg/m     General appearance: alert, cooperative and appears stated age Lymph:  no inguinal LAD noted  Pelvic: External genitalia:  no lesions              Urethra:  normal appearing urethra with no masses, tenderness or lesions              Bartholins and Skenes: normal                 Vagina: normal appearing vagina with normal  color and whitish discharge is present today, no lesions              Cervix: absent              Bimanual Exam:  Uterus:  uterus absent              Adnexa: no mass, fullness, tenderness  Chaperone, Zenovia Jordan, CMA, was present for exam.  Assessment: Vaginal irritation Vaginal discharge Recent hospitalization for further evaluation for h/o seizure d/o now with side effects  from medication dosage adjustment Depressed mood  Plan: Vaginitis testing again obtained today to ensure resolution If negative, will start vaginal estrogen cream with pt.  Administration discussed Will communicate with Dr. Karel Jarvis about possible sooner appt Additional options for psychiatry discussed   ~30 minutes total time spent with pt.  More of this was addressing recent hospitalization and possible ways to help pt with current symptoms/appointments.

## 2020-01-03 ENCOUNTER — Encounter: Payer: Self-pay | Admitting: Obstetrics & Gynecology

## 2020-01-04 DIAGNOSIS — F419 Anxiety disorder, unspecified: Secondary | ICD-10-CM | POA: Diagnosis not present

## 2020-01-04 DIAGNOSIS — F321 Major depressive disorder, single episode, moderate: Secondary | ICD-10-CM | POA: Diagnosis not present

## 2020-01-04 DIAGNOSIS — Z79899 Other long term (current) drug therapy: Secondary | ICD-10-CM | POA: Diagnosis not present

## 2020-01-04 DIAGNOSIS — F410 Panic disorder [episodic paroxysmal anxiety] without agoraphobia: Secondary | ICD-10-CM | POA: Diagnosis not present

## 2020-01-04 DIAGNOSIS — F329 Major depressive disorder, single episode, unspecified: Secondary | ICD-10-CM | POA: Diagnosis not present

## 2020-01-04 DIAGNOSIS — G40309 Generalized idiopathic epilepsy and epileptic syndromes, not intractable, without status epilepticus: Secondary | ICD-10-CM | POA: Diagnosis not present

## 2020-01-04 LAB — NUSWAB BV AND CANDIDA, NAA
Candida albicans, NAA: NEGATIVE
Candida glabrata, NAA: NEGATIVE

## 2020-01-05 ENCOUNTER — Encounter: Payer: Self-pay | Admitting: Obstetrics & Gynecology

## 2020-01-06 ENCOUNTER — Telehealth: Payer: Self-pay | Admitting: *Deleted

## 2020-01-06 NOTE — Telephone Encounter (Signed)
Campbell, April SHAD Clinical Pool  Phone Number: [phone]  [facility] Dr Hyacinth Meeker,  I contacted Dr. Madaline Guthrie and she is not currently taking new patients. Are you aware of any other psychiatrists that may be a good fit for me?  Thanks for your help, as always.  April Campbell    Routing MyChart message to Dr. Hyacinth Meeker to review and advise.

## 2020-01-06 NOTE — Telephone Encounter (Signed)
See telephone encounter dated 01/06/20.   Encounter closed.  

## 2020-01-08 ENCOUNTER — Other Ambulatory Visit: Payer: Self-pay

## 2020-01-08 ENCOUNTER — Telehealth: Payer: Self-pay

## 2020-01-08 MED ORDER — ESTRADIOL 0.1 MG/GM VA CREA: TOPICAL_CREAM | VAGINAL | 0 refills | Status: DC

## 2020-01-08 NOTE — Telephone Encounter (Signed)
Please let her know I reached out to a therapist I know and she suggested [facility] at [phone] [place] at [phone]

## 2020-01-08 NOTE — Telephone Encounter (Signed)
Patient returning call to taylor. °

## 2020-01-08 NOTE — Telephone Encounter (Signed)
The following results were discussed in detail with patient as seen below. Prescription has been sent to pharmacy and patient scheduled recheck in 4-6 weeks. Patient agreeable to plan of care and verbalized understanding. Closing encounter.

## 2020-01-08 NOTE — Telephone Encounter (Signed)
Tried calling patient with results as written by provider. No answer, left message for patient to call me back at [phone].

## 2020-01-08 NOTE — Telephone Encounter (Signed)
Jerene Bears, MD  Zenovia Jordan A, CMA  Please let pt know vaginitis swab was negative. We decided to treat with vaginal estradiol cream 1gm pv twice weekly for 4-6 weeks and then recheck. Ok to send in rx. If she starts to have breast tenderness, she needs to let me know. Thanks.

## 2020-01-08 NOTE — Telephone Encounter (Signed)
Call to patient, left detailed message, ok per dpr, name identified on voicemail. Advised as seen below per Dr. Hyacinth Meeker. Return call to office if any additional questions or assistance needed.    Encounter closed.

## 2020-01-12 DIAGNOSIS — G40309 Generalized idiopathic epilepsy and epileptic syndromes, not intractable, without status epilepticus: Secondary | ICD-10-CM | POA: Diagnosis not present

## 2020-01-12 DIAGNOSIS — Z79899 Other long term (current) drug therapy: Secondary | ICD-10-CM | POA: Diagnosis not present

## 2020-01-15 ENCOUNTER — Encounter: Payer: Self-pay | Admitting: Certified Nurse Midwife

## 2020-01-23 ENCOUNTER — Ambulatory Visit: Payer: BC Managed Care – PPO | Attending: Internal Medicine

## 2020-01-23 DIAGNOSIS — Z23 Encounter for immunization: Secondary | ICD-10-CM

## 2020-01-23 NOTE — Progress Notes (Signed)
   Covid-19 Vaccination Clinic  Name:  April Campbell    MRN: [id number] DOB: 1976-01-03  01/23/2020  [name] was observed post Covid-19 immunization for 15 minutes without incident. She was provided with Vaccine Information Sheet and instruction to access the V-Safe system.   [name] was instructed to call 911 with any severe reactions post vaccine: Marland Kitchen Difficulty breathing  . Swelling of face and throat  . A fast heartbeat  . A bad rash all over body  . Dizziness and weakness   Immunizations Administered    Name Date Dose VIS Date Route   Pfizer COVID-19 Vaccine 01/23/2020 11:08 AM 0.3 mL 10/09/2019 Intramuscular   Manufacturer: [employer], [employer]   Lot: [id number]   NDC: [address]-6

## 2020-01-28 DIAGNOSIS — F329 Major depressive disorder, single episode, unspecified: Secondary | ICD-10-CM | POA: Diagnosis not present

## 2020-01-28 DIAGNOSIS — F410 Panic disorder [episodic paroxysmal anxiety] without agoraphobia: Secondary | ICD-10-CM | POA: Diagnosis not present

## 2020-01-28 DIAGNOSIS — F419 Anxiety disorder, unspecified: Secondary | ICD-10-CM | POA: Diagnosis not present

## 2020-01-28 DIAGNOSIS — Z79899 Other long term (current) drug therapy: Secondary | ICD-10-CM | POA: Diagnosis not present

## 2020-01-28 DIAGNOSIS — F321 Major depressive disorder, single episode, moderate: Secondary | ICD-10-CM | POA: Diagnosis not present

## 2020-02-09 ENCOUNTER — Encounter: Payer: Self-pay | Admitting: Neurology

## 2020-02-09 ENCOUNTER — Other Ambulatory Visit: Payer: Self-pay

## 2020-02-09 ENCOUNTER — Ambulatory Visit: Payer: BC Managed Care – PPO | Admitting: Neurology

## 2020-02-09 VITALS — BP 128/80 | HR 74 | Temp 97.1°F | Ht 65.0 in | Wt 220.5 lb

## 2020-02-09 DIAGNOSIS — Z5181 Encounter for therapeutic drug level monitoring: Secondary | ICD-10-CM | POA: Diagnosis not present

## 2020-02-09 DIAGNOSIS — G40319 Generalized idiopathic epilepsy and epileptic syndromes, intractable, without status epilepticus: Secondary | ICD-10-CM | POA: Diagnosis not present

## 2020-02-09 MED ORDER — LAMOTRIGINE 100 MG PO TABS: ORAL_TABLET | ORAL | Status: DC

## 2020-02-09 NOTE — Progress Notes (Signed)
Reason for visit: Seizures  Referring physician: Dr. Stacie Acres Cissell is a 44 y.o. female  History of present illness:  April Campbell is a 45 year old right-handed white female with a history of intractable seizures.  The patient had onset of generalized seizures in her 57s.  Throughout her life she has had episodes of feeling as if her mind is racing and that her body inside is speeding up but everything around her is going slowly and she will have a vibrating feeling and feels if she can predict the future.  These episodes have been felt to be seizure aura.  The patient has had 3 lifetime generalized tonic-clonic seizures.  The patient has seizure in 2010 when she was tapered off of Depakote, and 2013 there was a prescription mixup and she had a generalized seizure.  She had a nonconvulsive event in 2019 when she was tapered off of Zonegran.  The patient is back on Zonegran and she is on Lamictal.  The combination of these medications did result in some cognitive side effects, reducing the dose of Lamictal to some degree has improved the cognitive side effects and has continued to help her anxiety issues.  She is followed through psychiatry for depression and anxiety.  In the past, Depakote resulted in cognitive slowing and weight gain.  Keppra was ineffective.  The patient believes that she is doing well on her current dose.  She is not currently driving but will resume driving in August 7741, approximately 6 months after her medications were recently altered.  The patient has been followed through [facility]. Joesph July at [facility].  EEG evaluation has shown evidence of generalized paroxysmal slow-wave activity consistent with seizures, a routine EEG study was normal.  MRI of the brain done was normal.  The patient denies any prior history of closed head injury, she reports no focal numbness or weakness of the face, arms, legs.  She denies any gait instability or difficulty controlling the bowels or the  bladder.  She does have a maternal grandfather who had seizures.  Past Medical History:  Diagnosis Date  . Anxiety   . Depression   . Endometriosis   . GERD (gastroesophageal reflux disease)   . History of migraine headaches   . Seizures ([place])    controlled with meds, last seizure 4-5 yrs ago    Past Surgical History:  Procedure Laterality Date  . CARPAL TUNNEL RELEASE Right 06/14/2015   Procedure: RIGHT CARPAL TUNNEL RELEASE ENDOSCOPIC;  Surgeon: Milly Jakob, MD;  Location: [place];  Service: Orthopedics;  Laterality: Right;  . COLONOSCOPY WITH PROPOFOL N/A 02/10/2013   Procedure: COLONOSCOPY WITH PROPOFOL;  Surgeon: Garlan Fair, MD;  [facility];  Service: Endoscopy;  Laterality: N/A;  . CYSTOSCOPY N/A 05/14/2016   Procedure: CYSTOSCOPY;  Surgeon: Megan Salon, MD;  Location: [facility];  Service: Gynecology;  Laterality: N/A;  . DIAGNOSTIC LAPAROSCOPY  12/15/2009   uterosacral nerve ablation  . ELBOW SURGERY Left 2012  . LAPAROSCOPIC BILATERAL SALPINGECTOMY Bilateral 05/14/2016   Procedure: LAPAROSCOPIC BILATERAL SALPINGECTOMY;  Surgeon: Megan Salon, MD;  Location: [facility];  Service: Gynecology;  Laterality: Bilateral;  . LAPAROSCOPIC HYSTERECTOMY N/A 05/14/2016   Procedure: HYSTERECTOMY TOTAL LAPAROSCOPIC;  Surgeon: Megan Salon, MD;  Location: [facility];  Service: Gynecology;  Laterality: N/A;  . UMBILICAL HERNIA REPAIR     as an infant  . WISDOM TOOTH EXTRACTION    . YAG LASER APPLICATION  2/87/8676  vaporization of endometriosis    Family History  Problem Relation Age of Onset  . Hypertension Mother   . Diabetes Mother   . Irritable bowel syndrome Mother   . Hypertension Maternal Grandmother   . Diabetes Maternal Grandmother   . Other Father        bile duct cancer  . Irritable bowel syndrome Maternal Aunt     Social history:  reports that she has never smoked. She has never used smokeless tobacco. She reports current alcohol use of  about 2.0 standard drinks of alcohol per week. She reports that she does not use drugs.  Medications:  Prior to Admission medications   Medication Sig Start Date End Date Taking? Authorizing Provider  Cholecalciferol (D3 VITAMIN PO) 1,000 Units.  10/29/18  Yes [provider]  estradiol (ESTRACE) 0.1 MG/GM vaginal cream Place 1gm pv twice weekly for 4-6 weeks 01/08/20  Yes Jerene Bears, MD  famotidine (PEPCID) 20 MG tablet Take by mouth.   Yes [provider]  fluticasone (FLONASE) 50 MCG/ACT nasal spray Place into the nose.   Yes [provider]  ibuprofen (ADVIL,MOTRIN) 200 MG tablet Take 800 mg by mouth as needed for headache or moderate pain.    Yes [provider]  Lactobacillus (PROBIOTIC ACIDOPHILUS PO) Take 1 capsule by mouth daily.    Yes [provider]  lamoTRIgine (LAMICTAL) 100 MG tablet Take 250 mg by mouth 2 (two) times daily. Take 100 in am and 150 ar qhs   Yes [provider]  loratadine (CLARITIN) 10 MG tablet Take 10 mg by mouth daily.    Yes [provider]  Potassium 99 MG TABS Take 99 mg by mouth daily.    Yes [provider]  sertraline (ZOLOFT) 50 MG tablet Take 1/2 tab a day for a week, then 1 tab a day 01/14/20  Yes [provider]  vitamin E 180 MG (400 UNITS) capsule daily.   Yes [provider]  zonisamide (ZONEGRAN) 100 MG capsule Take 500 mg by mouth at bedtime.  07/04/17  Yes [provider]  zonisamide (ZONEGRAN) 100 MG capsule Take 5 capsules nightly 01/12/20  Yes [provider]      Allergies  Allergen Reactions  . Wellbutrin [Bupropion Hcl] Other (See Comments)    SEIZURE  . Escitalopram Oxalate Other (See Comments)    Decreased sex drive  . Topiramate Other (See Comments)    foggy    ROS:  Out of a complete 14 system review of symptoms, the patient complains only of the following symptoms, and all other reviewed systems are  negative.  Seizures Weight gain Depression, anxiety  Blood pressure 128/80, pulse 74, temperature (!) 97.1 F (36.2 C), height 5\' 5"  (1.651 m), weight 220 lb 8 oz (100 kg), last menstrual period 04/27/2016.  Physical Exam  General: The patient is alert and cooperative at the time of the examination.  The patient is obese.  Eyes: Pupils are equal, round, and reactive to light. Discs are flat bilaterally.  Good venous pulsations are noted bilaterally.  Neck: The neck is supple, no carotid bruits are noted.  Respiratory: The respiratory examination is clear.  Cardiovascular: The cardiovascular examination reveals a regular rate and rhythm, no obvious murmurs or rubs are noted.  Skin: Extremities are without significant edema.  Neurologic Exam  Mental status: The patient is alert and oriented x 3 at the time of the examination. The patient has apparent normal recent and remote memory,  with an apparently normal attention span and concentration ability.  Cranial nerves: Facial symmetry is present. There is good sensation of the face to pinprick and soft touch bilaterally. The strength of the facial muscles and the muscles to head turning and shoulder shrug are normal bilaterally. Speech is well enunciated, no aphasia or dysarthria is noted. Extraocular movements are full. Visual fields are full. The tongue is midline, and the patient has symmetric elevation of the soft palate. No obvious hearing deficits are noted.  Motor: The motor testing reveals 5 over 5 strength of all 4 extremities. Good symmetric motor tone is noted throughout.  Sensory: Sensory testing is intact to pinprick, soft touch, vibration sensation, and position sense on all 4 extremities. No evidence of extinction is noted.  Coordination: Cerebellar testing reveals good finger-nose-finger and heel-to-shin bilaterally.  Gait and station: Gait is normal. Tandem gait is normal. Romberg is negative. No drift is  seen.  Reflexes: Deep tendon reflexes are symmetric and normal bilaterally. Toes are downgoing bilaterally.   MRI brain 12/14/15:  Result Impression   Normal unenhanced MRI of the brain.       Assessment/Plan:  1.  Generalized seizure disorder  2.  Anxiety and depression  The patient is doing well currently on the combination of Zonegran and Lamictal.  We will check blood work today.  The patient will follow up in 6 months, if she continues to do well we will see her on an annual basis.  Marlan Palau MD 02/09/2020 8:45 AM  [facility] [address] [place], [place] [address]  Phone [phone] Fax [phone]

## 2020-02-11 DIAGNOSIS — F329 Major depressive disorder, single episode, unspecified: Secondary | ICD-10-CM | POA: Diagnosis not present

## 2020-02-11 DIAGNOSIS — F419 Anxiety disorder, unspecified: Secondary | ICD-10-CM | POA: Diagnosis not present

## 2020-02-11 DIAGNOSIS — Z79899 Other long term (current) drug therapy: Secondary | ICD-10-CM | POA: Diagnosis not present

## 2020-02-11 LAB — ZONISAMIDE LEVEL: Zonisamide: 16.5 ug/mL (ref 10.0–40.0)

## 2020-02-11 LAB — LAMOTRIGINE LEVEL: Lamotrigine Lvl: 5.4 ug/mL (ref 2.0–20.0)

## 2020-02-16 ENCOUNTER — Ambulatory Visit: Payer: BC Managed Care – PPO | Attending: Internal Medicine

## 2020-02-16 DIAGNOSIS — Z23 Encounter for immunization: Secondary | ICD-10-CM

## 2020-02-16 NOTE — Progress Notes (Signed)
   Covid-19 Vaccination Clinic  Name:  Ashleyanne Hemmingway Lader    MRN: [id number] DOB: 21-Sep-1976  02/16/2020  Ms. Bucaro was observed post Covid-19 immunization for 15 minutes without incident. She was provided with Vaccine Information Sheet and instruction to access the V-Safe system.   Ms. Dedeaux was instructed to call 911 with any severe reactions post vaccine: Marland Kitchen Difficulty breathing  . Swelling of face and throat  . A fast heartbeat  . A bad rash all over body  . Dizziness and weakness   Immunizations Administered    Name Date Dose VIS Date Route   Pfizer COVID-19 Vaccine 02/16/2020  3:31 PM 0.3 mL 12/23/2018 Intramuscular   Manufacturer: [employer], [employer]   Lot: [id number]   NDC: [address]-9

## 2020-02-26 ENCOUNTER — Ambulatory Visit: Payer: Self-pay | Admitting: Obstetrics & Gynecology

## 2020-02-28 IMAGING — MG DIGITAL SCREENING BILAT W/ TOMO W/ CAD
6 of 10 series · 6 of 30 positions shown · non-contrast
Comparison: None.

CLINICAL DATA: Screening.

EXAM:
DIGITAL SCREENING BILATERAL MAMMOGRAM WITH TOMO AND CAD

[R MLO synth-2D]
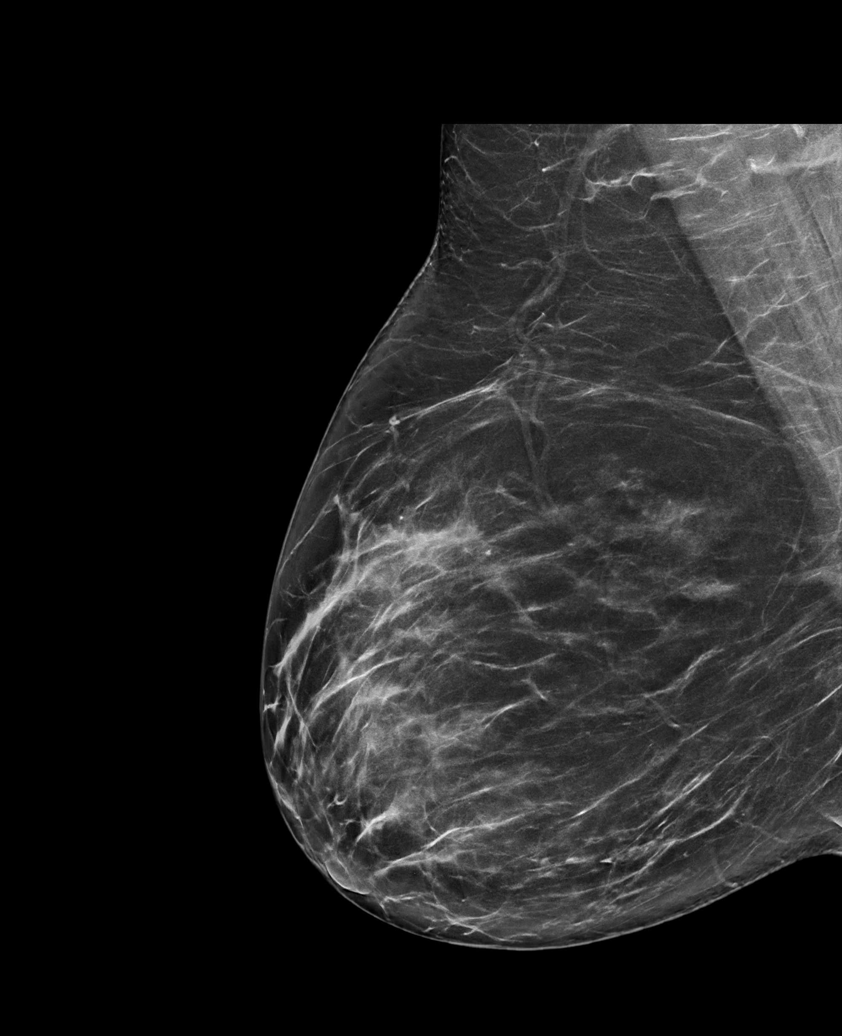

[R CC synth-2D]
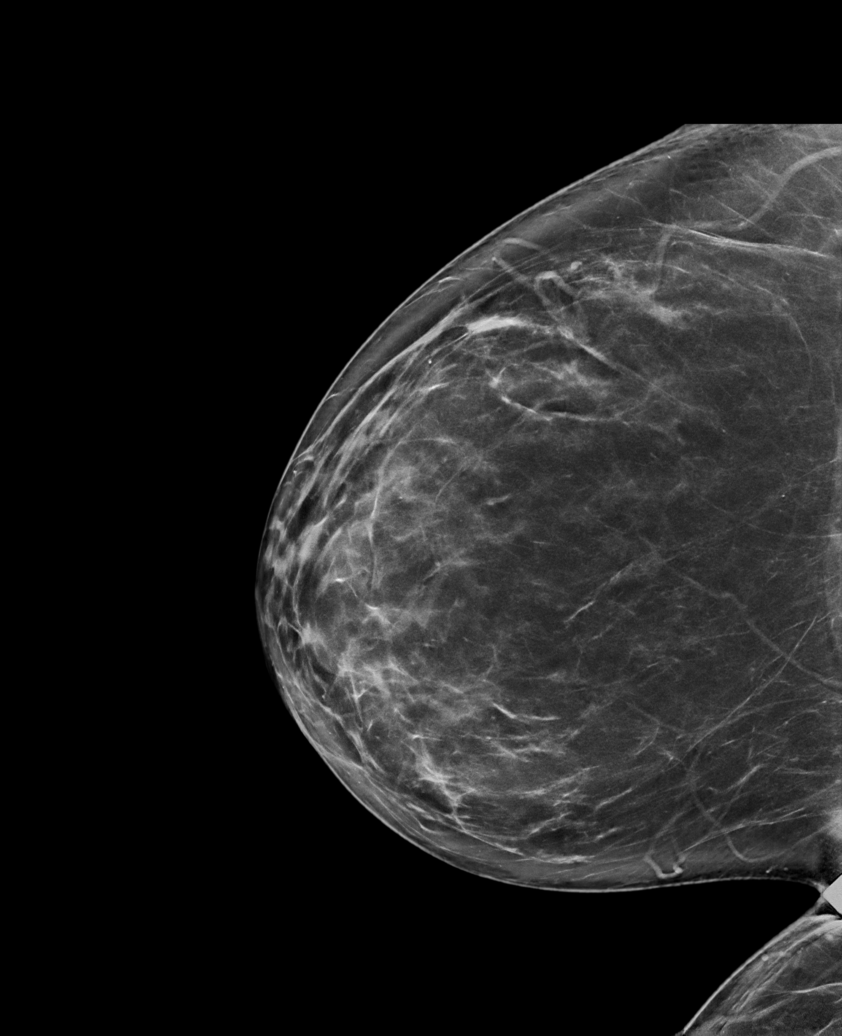

[L CC synth-2D]
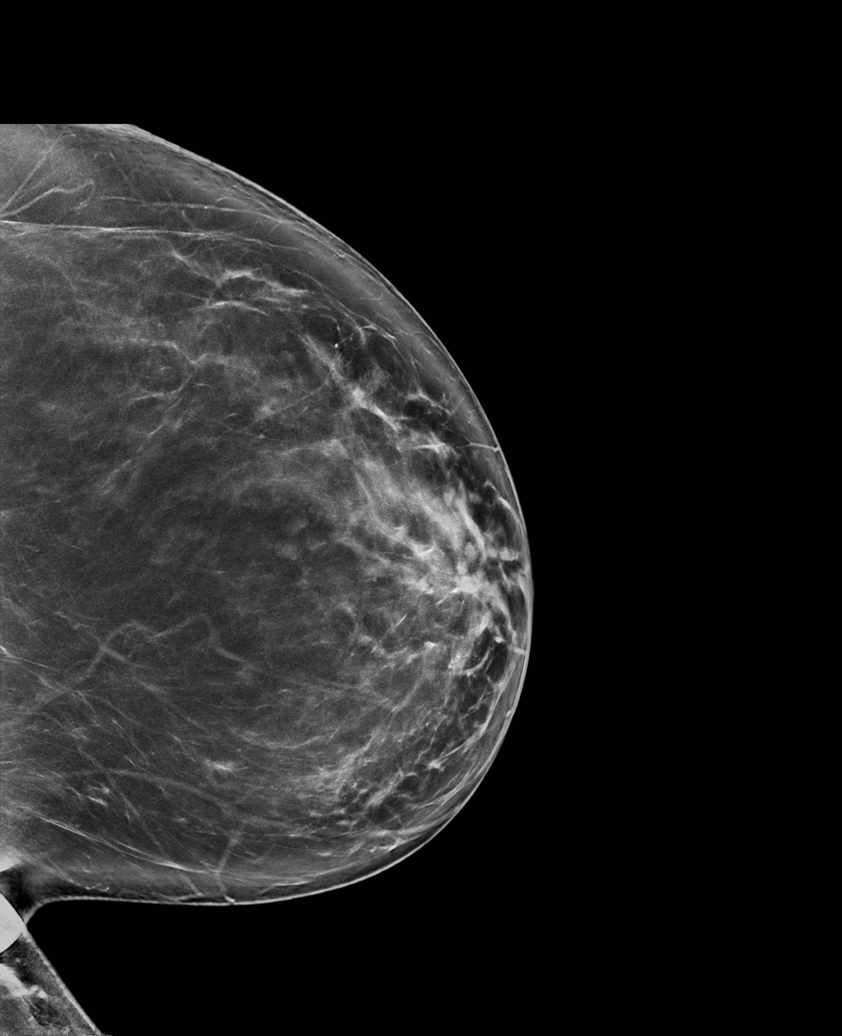

[L MLO synth-2D (1 of 2)]
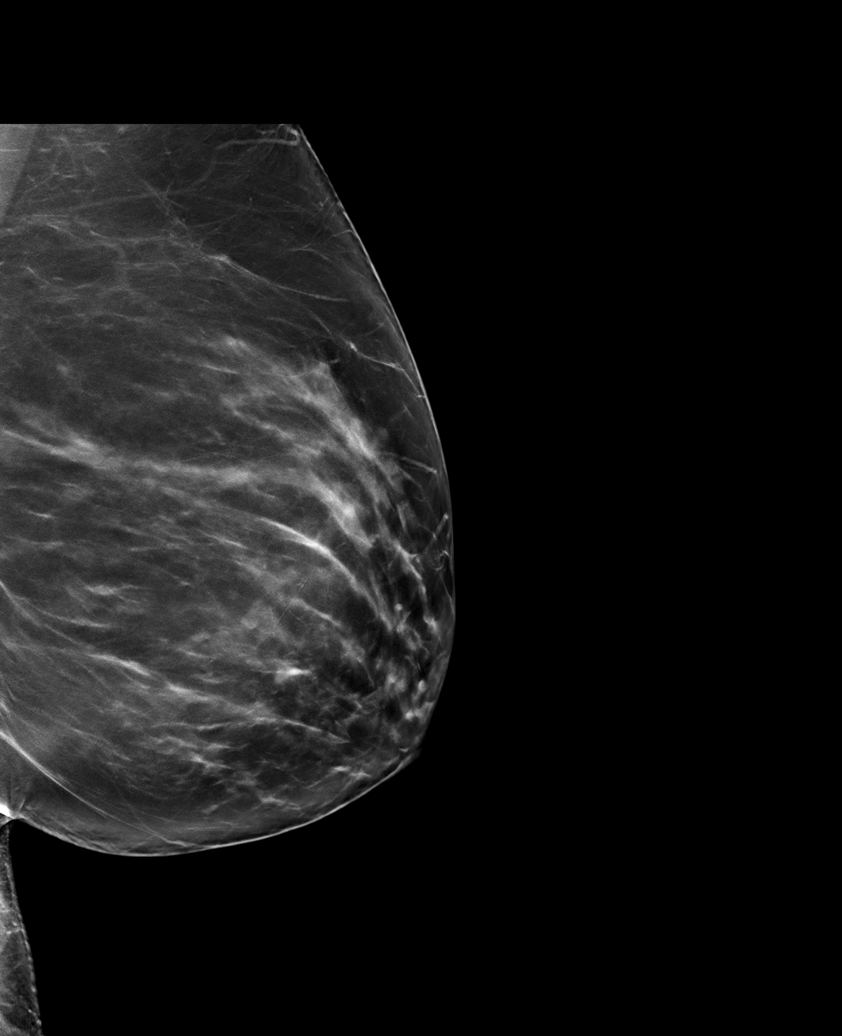

[L MLO synth-2D (2 of 2)]
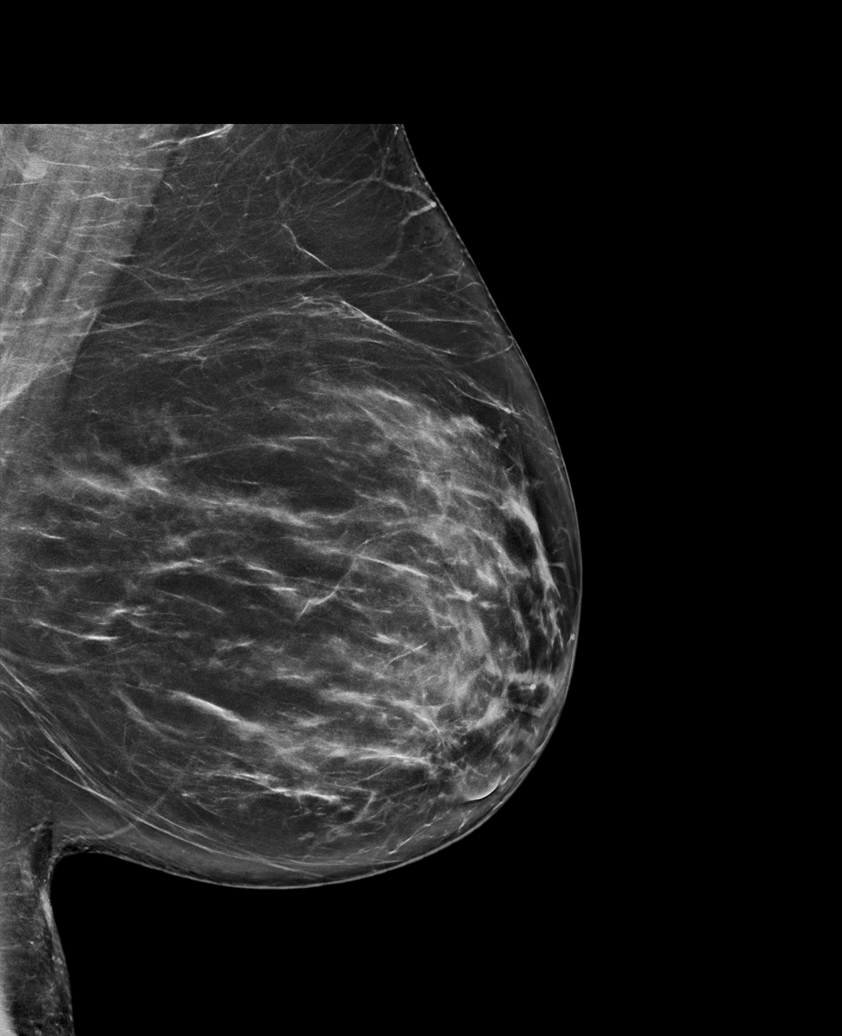

[R CC tomo · tomo slice 44/87.0]
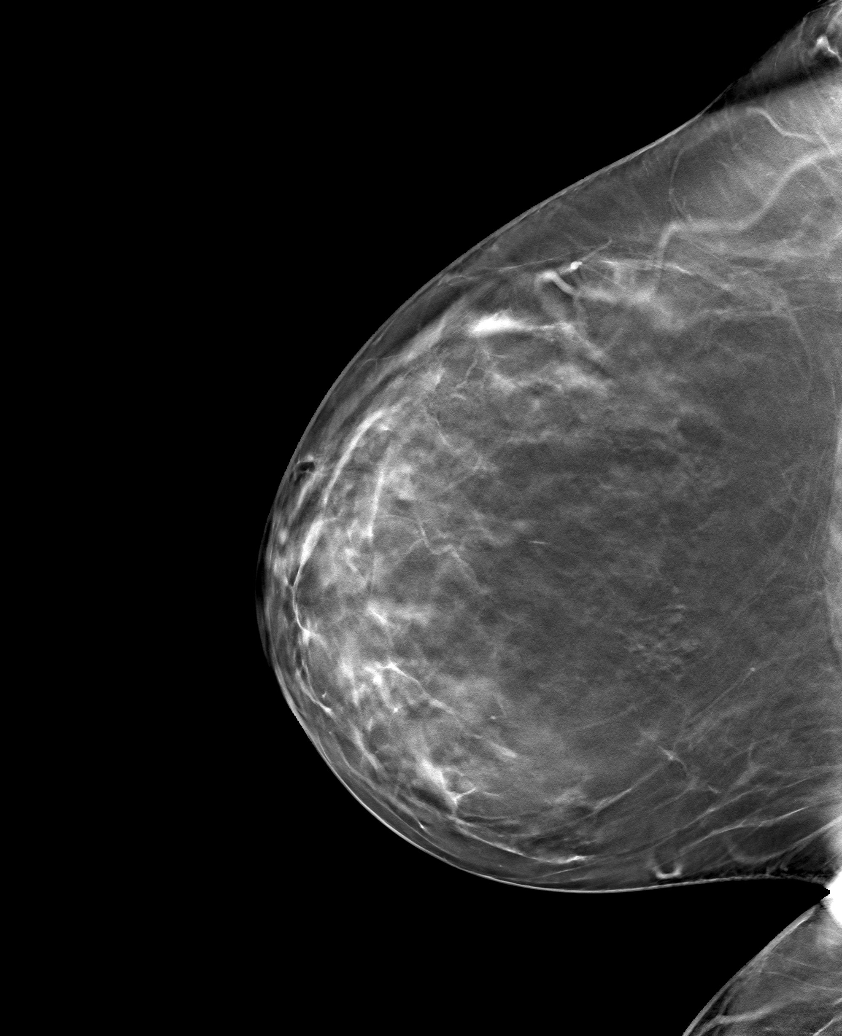

[6 of 30 positions shown; findings below may reference images not displayed]

ACR Breast Density Category b: There are scattered areas of
fibroglandular density.
FINDINGS: There are no findings suspicious for malignancy. Images were
processed with CAD.
IMPRESSION: No mammographic evidence of malignancy. A result letter of this
screening mammogram will be mailed directly to the patient.

RECOMMENDATION:
Screening mammogram in one year. (Code:Y5-G-EJ6)

BI-RADS CATEGORY  1: Negative.

## 2020-02-29 NOTE — Progress Notes (Deleted)
GYNECOLOGY  VISIT  CC:   ***  HPI: 44 y.o. G0P0000 Married White or Caucasian female here for f/u of vaginal itching & irritation. Started using vaginal estradi  GYNECOLOGIC HISTORY: Patient's last menstrual period was 04/27/2016 (approximate). Contraception: *** Menopausal hormone therapy: estradiol vaginal cream  Patient Active Problem List   Diagnosis Date Noted  . Anxiety 12/14/2016  . Endometriosis 04/25/2016  . PCOS (polycystic ovarian syndrome) 03/19/2016  . Pelvic pain in female 03/19/2016  . Epilepsy with partial complex seizures ([place]) 01/21/2016  . Idiopathic generalized epilepsy ([place]) 12/06/2015  . Migraine without aura and responsive to treatment 12/06/2015    Past Medical History:  Diagnosis Date  . Anxiety   . Depression   . Endometriosis   . GERD (gastroesophageal reflux disease)   . History of migraine headaches   . Seizures ([place])    controlled with meds, last seizure 4-5 yrs ago    Past Surgical History:  Procedure Laterality Date  . CARPAL TUNNEL RELEASE Right 06/14/2015   Procedure: RIGHT CARPAL TUNNEL RELEASE ENDOSCOPIC;  Surgeon: Milly Jakob, MD;  Location: [facility];  Service: Orthopedics;  Laterality: Right;  . COLONOSCOPY WITH PROPOFOL N/A 02/10/2013   Procedure: COLONOSCOPY WITH PROPOFOL;  Surgeon: Garlan Fair, MD;  Location[facility];  Service: Endoscopy;  Laterality: N/A;  . CYSTOSCOPY N/A 05/14/2016   Procedure: CYSTOSCOPY;  Surgeon: Megan Salon, MD;  Location: [facility];  Service: Gynecology;  Laterality: N/A;  . DIAGNOSTIC LAPAROSCOPY  12/15/2009   uterosacral nerve ablation  . ELBOW SURGERY Left 2012  . LAPAROSCOPIC BILATERAL SALPINGECTOMY Bilateral 05/14/2016   Procedure: LAPAROSCOPIC BILATERAL SALPINGECTOMY;  Surgeon: Megan Salon, MD;  Location: [facility];  Service: Gynecology;  Laterality: Bilateral;  . LAPAROSCOPIC HYSTERECTOMY N/A 05/14/2016   Procedure: HYSTERECTOMY TOTAL LAPAROSCOPIC;  Surgeon: Megan Salon,  MD;  Location: [facility];  Service: Gynecology;  Laterality: N/A;  . UMBILICAL HERNIA REPAIR     as an infant  . WISDOM TOOTH EXTRACTION    . YAG LASER APPLICATION  3/53/2992   vaporization of endometriosis    MEDS:   Current Outpatient Medications on File Prior to Visit  Medication Sig Dispense Refill  . Cholecalciferol (D3 VITAMIN PO) 1,000 Units.     Marland Kitchen estradiol (ESTRACE) 0.1 MG/GM vaginal cream Place 1gm pv twice weekly for 4-6 weeks 42.5 g 0  . famotidine (PEPCID) 20 MG tablet Take by mouth.    . fluticasone (FLONASE) 50 MCG/ACT nasal spray Place into the nose.    . ibuprofen (ADVIL,MOTRIN) 200 MG tablet Take 800 mg by mouth as needed for headache or moderate pain.     . Lactobacillus (PROBIOTIC ACIDOPHILUS PO) Take 1 capsule by mouth daily.     Marland Kitchen lamoTRIgine (LAMICTAL) 100 MG tablet One in the morning and 1.5 in the evening    . loratadine (CLARITIN) 10 MG tablet Take 10 mg by mouth daily.     . Potassium 99 MG TABS Take 99 mg by mouth daily.     . sertraline (ZOLOFT) 50 MG tablet Take 1/2 tab a day for a week, then 1 tab a day    . vitamin E 180 MG (400 UNITS) capsule daily.    Marland Kitchen zonisamide (ZONEGRAN) 100 MG capsule Take 500 mg by mouth at bedtime.     Marland Kitchen zonisamide (ZONEGRAN) 100 MG capsule Take 5 capsules nightly     No current facility-administered medications on file prior to visit.    ALLERGIES: Wellbutrin [bupropion  hcl], Escitalopram oxalate, and Topiramate  Family History  Problem Relation Age of Onset  . Hypertension Mother   . Diabetes Mother   . Irritable bowel syndrome Mother   . Hypertension Maternal Grandmother   . Diabetes Maternal Grandmother   . Other Father        bile duct cancer  . Irritable bowel syndrome Maternal Aunt     SH:  ***  Review of Systems  PHYSICAL EXAMINATION:    LMP 04/27/2016 (Approximate)     General appearance: alert, cooperative and appears stated age Neck: no adenopathy, supple, symmetrical, trachea midline and thyroid {CHL  AMB PHY EX THYROID NORM DEFAULT:[phone]::"normal to inspection and palpation"} CV:  {Exam; heart brief:31539} Lungs:  {pe lungs ob:314451::"clear to auscultation, no wheezes, rales or rhonchi, symmetric air entry"} Breasts: {Exam; breast:13139::"normal appearance, no masses or tenderness"} Abdomen: soft, non-tender; bowel sounds normal; no masses,  no organomegaly Lymph:  no inguinal LAD noted  Pelvic: External genitalia:  no lesions              Urethra:  normal appearing urethra with no masses, tenderness or lesions              Bartholins and Skenes: normal                 Vagina: normal appearing vagina with normal color and discharge, no lesions              Cervix: {CHL AMB PHY EX CERVIX NORM [phone]::"no lesions"}              Bimanual Exam:  Uterus:  {CHL AMB PHY EX UTERUS NORM DEFAULT:[phone]::"normal size, contour, position, consistency, mobility, non-tender"}              Adnexa: {CHL AMB PHY EX ADNEXA NO MASS DEFAULT:[phone]::"no mass, fullness, tenderness"}              Rectovaginal: {yes no:314532}.  Confirms.              Anus:  normal sphincter tone, no lesions  Chaperone, ***Zenovia Jordan, CMA, was present for exam.  Assessment: ***  Plan: ***   ~{NUMBERS; -10-45 JOINT ROM:10287} minutes spent with patient >50% of time was in face to face discussion of above.

## 2020-03-01 NOTE — Progress Notes (Deleted)
GYNECOLOGY  VISIT  CC:   ***  HPI: 44 y.o. G0P0000 Married White or Caucasian female here for follow up after estradiol vaginal cream use.  GYNECOLOGIC HISTORY: Patient's last menstrual period was 04/27/2016 (approximate). Contraception: *** Menopausal hormone therapy: estradiol vaginal cream  Patient Active Problem List   Diagnosis Date Noted  . Anxiety 12/14/2016  . Endometriosis 04/25/2016  . PCOS (polycystic ovarian syndrome) 03/19/2016  . Pelvic pain in female 03/19/2016  . Epilepsy with partial complex seizures ([facility]) 01/21/2016  . Idiopathic generalized epilepsy ([facility]) 12/06/2015  . Migraine without aura and responsive to treatment 12/06/2015    Past Medical History:  Diagnosis Date  . Anxiety   . Depression   . Endometriosis   . GERD (gastroesophageal reflux disease)   . History of migraine headaches   . Seizures ([facility])    controlled with meds, last seizure 4-5 yrs ago    Past Surgical History:  Procedure Laterality Date  . CARPAL TUNNEL RELEASE Right 06/14/2015   Procedure: RIGHT CARPAL TUNNEL RELEASE ENDOSCOPIC;  Surgeon: Mack Hook, MD;  Location: [facility];  Service: Orthopedics;  Laterality: Right;  . COLONOSCOPY WITH PROPOFOL N/A 02/10/2013   Procedure: COLONOSCOPY WITH PROPOFOL;  Surgeon: Charolett Bumpers, MD;  [facility];  Service: Endoscopy;  Laterality: N/A;  . CYSTOSCOPY N/A 05/14/2016   Procedure: CYSTOSCOPY;  Surgeon: Jerene Bears, MD;  Location: [facility];  Service: Gynecology;  Laterality: N/A;  . DIAGNOSTIC LAPAROSCOPY  12/15/2009   uterosacral nerve ablation  . ELBOW SURGERY Left 2012  . LAPAROSCOPIC BILATERAL SALPINGECTOMY Bilateral 05/14/2016   Procedure: LAPAROSCOPIC BILATERAL SALPINGECTOMY;  Surgeon: Jerene Bears, MD;  Location: [facility];  Service: Gynecology;  Laterality: Bilateral;  . LAPAROSCOPIC HYSTERECTOMY N/A 05/14/2016   Procedure: HYSTERECTOMY TOTAL LAPAROSCOPIC;  Surgeon: Jerene Bears, MD;  Location: [facility];   Service: Gynecology;  Laterality: N/A;  . UMBILICAL HERNIA REPAIR     as an infant  . WISDOM TOOTH EXTRACTION    . YAG LASER APPLICATION  06/24/2006   vaporization of endometriosis    MEDS:   Current Outpatient Medications on File Prior to Visit  Medication Sig Dispense Refill  . Cholecalciferol (D3 VITAMIN PO) 1,000 Units.     Marland Kitchen estradiol (ESTRACE) 0.1 MG/GM vaginal cream Place 1gm pv twice weekly for 4-6 weeks 42.5 g 0  . famotidine (PEPCID) 20 MG tablet Take by mouth.    . fluticasone (FLONASE) 50 MCG/ACT nasal spray Place into the nose.    . ibuprofen (ADVIL,MOTRIN) 200 MG tablet Take 800 mg by mouth as needed for headache or moderate pain.     . Lactobacillus (PROBIOTIC ACIDOPHILUS PO) Take 1 capsule by mouth daily.     Marland Kitchen lamoTRIgine (LAMICTAL) 100 MG tablet One in the morning and 1.5 in the evening    . loratadine (CLARITIN) 10 MG tablet Take 10 mg by mouth daily.     . Potassium 99 MG TABS Take 99 mg by mouth daily.     . sertraline (ZOLOFT) 50 MG tablet Take 1/2 tab a day for a week, then 1 tab a day    . vitamin E 180 MG (400 UNITS) capsule daily.    Marland Kitchen zonisamide (ZONEGRAN) 100 MG capsule Take 500 mg by mouth at bedtime.     Marland Kitchen zonisamide (ZONEGRAN) 100 MG capsule Take 5 capsules nightly     No current facility-administered medications on file prior to visit.    ALLERGIES: Wellbutrin [bupropion hcl], Escitalopram oxalate,  and Topiramate  Family History  Problem Relation Age of Onset  . Hypertension Mother   . Diabetes Mother   . Irritable bowel syndrome Mother   . Hypertension Maternal Grandmother   . Diabetes Maternal Grandmother   . Other Father        bile duct cancer  . Irritable bowel syndrome Maternal Aunt     SH:  ***  Review of Systems  PHYSICAL EXAMINATION:    LMP 04/27/2016 (Approximate)     General appearance: alert, cooperative and appears stated age Neck: no adenopathy, supple, symmetrical, trachea midline and thyroid {CHL AMB PHY EX THYROID NORM  DEFAULT:[phone]::"normal to inspection and palpation"} CV:  {Exam; heart brief:31539} Lungs:  {pe lungs ob:314451::"clear to auscultation, no wheezes, rales or rhonchi, symmetric air entry"} Breasts: {Exam; breast:13139::"normal appearance, no masses or tenderness"} Abdomen: soft, non-tender; bowel sounds normal; no masses,  no organomegaly Lymph:  no inguinal LAD noted  Pelvic: External genitalia:  no lesions              Urethra:  normal appearing urethra with no masses, tenderness or lesions              Bartholins and Skenes: normal                 Vagina: normal appearing vagina with normal color and discharge, no lesions              Cervix: {CHL AMB PHY EX CERVIX NORM [phone]::"no lesions"}              Bimanual Exam:  Uterus:  {CHL AMB PHY EX UTERUS NORM [phone]::"normal size, contour, position, consistency, mobility, non-tender"}              Adnexa: {CHL AMB PHY EX ADNEXA NO MASS DEFAULT:[phone]::"no mass, fullness, tenderness"}              Rectovaginal: {yes no:314532}.  Confirms.              Anus:  normal sphincter tone, no lesions  Chaperone, ***Terence Lux, CMA, was present for exam.  Assessment: ***  Plan: ***   ~{NUMBERS; -10-45 JOINT ROM:10287} minutes spent with patient >50% of time was in face to face discussion of above.

## 2020-03-03 NOTE — Progress Notes (Deleted)
GYNECOLOGY  VISIT  CC:   ***  HPI: 44 y.o. G0P0000 Married White or Caucasian female here for f/u of vaginal itching & irritation. Started using vaginal estradiol.  GYNECOLOGIC HISTORY: Patient's last menstrual period was 04/27/2016 (approximate). Contraception: *** Menopausal hormone therapy: estradiol vaginal cream  Patient Active Problem List   Diagnosis Date Noted  . Anxiety 12/14/2016  . Endometriosis 04/25/2016  . PCOS (polycystic ovarian syndrome) 03/19/2016  . Pelvic pain in female 03/19/2016  . Epilepsy with partial complex seizures ([place]) 01/21/2016  . Idiopathic generalized epilepsy ([place]) 12/06/2015  . Migraine without aura and responsive to treatment 12/06/2015    Past Medical History:  Diagnosis Date  . Anxiety   . Depression   . Endometriosis   . GERD (gastroesophageal reflux disease)   . History of migraine headaches   . Seizures ([place])    controlled with meds, last seizure 4-5 yrs ago    Past Surgical History:  Procedure Laterality Date  . CARPAL TUNNEL RELEASE Right 06/14/2015   Procedure: RIGHT CARPAL TUNNEL RELEASE ENDOSCOPIC;  Surgeon: Milly Jakob, MD;  Location: [facility];  Service: Orthopedics;  Laterality: Right;  . COLONOSCOPY WITH PROPOFOL N/A 02/10/2013   Procedure: COLONOSCOPY WITH PROPOFOL;  Surgeon: Garlan Fair, MD;  [facility];  Service: Endoscopy;  Laterality: N/A;  . CYSTOSCOPY N/A 05/14/2016   Procedure: CYSTOSCOPY;  Surgeon: Megan Salon, MD;  Location: [facility];  Service: Gynecology;  Laterality: N/A;  . DIAGNOSTIC LAPAROSCOPY  12/15/2009   uterosacral nerve ablation  . ELBOW SURGERY Left 2012  . LAPAROSCOPIC BILATERAL SALPINGECTOMY Bilateral 05/14/2016   Procedure: LAPAROSCOPIC BILATERAL SALPINGECTOMY;  Surgeon: Megan Salon, MD;  Location: [facility];  Service: Gynecology;  Laterality: Bilateral;  . LAPAROSCOPIC HYSTERECTOMY N/A 05/14/2016   Procedure: HYSTERECTOMY TOTAL LAPAROSCOPIC;  Surgeon: Megan Salon,  MD;  Location: [facility];  Service: Gynecology;  Laterality: N/A;  . UMBILICAL HERNIA REPAIR     as an infant  . WISDOM TOOTH EXTRACTION    . YAG LASER APPLICATION  1/74/0814   vaporization of endometriosis    MEDS:   Current Outpatient Medications on File Prior to Visit  Medication Sig Dispense Refill  . Cholecalciferol (D3 VITAMIN PO) 1,000 Units.     Marland Kitchen estradiol (ESTRACE) 0.1 MG/GM vaginal cream Place 1gm pv twice weekly for 4-6 weeks 42.5 g 0  . famotidine (PEPCID) 20 MG tablet Take by mouth.    . fluticasone (FLONASE) 50 MCG/ACT nasal spray Place into the nose.    . ibuprofen (ADVIL,MOTRIN) 200 MG tablet Take 800 mg by mouth as needed for headache or moderate pain.     . Lactobacillus (PROBIOTIC ACIDOPHILUS PO) Take 1 capsule by mouth daily.     Marland Kitchen lamoTRIgine (LAMICTAL) 100 MG tablet One in the morning and 1.5 in the evening    . loratadine (CLARITIN) 10 MG tablet Take 10 mg by mouth daily.     . Potassium 99 MG TABS Take 99 mg by mouth daily.     . sertraline (ZOLOFT) 50 MG tablet Take 1/2 tab a day for a week, then 1 tab a day    . vitamin E 180 MG (400 UNITS) capsule daily.    Marland Kitchen zonisamide (ZONEGRAN) 100 MG capsule Take 500 mg by mouth at bedtime.     Marland Kitchen zonisamide (ZONEGRAN) 100 MG capsule Take 5 capsules nightly     No current facility-administered medications on file prior to visit.    ALLERGIES: Wellbutrin [bupropion  hcl], Escitalopram oxalate, and Topiramate  Family History  Problem Relation Age of Onset  . Hypertension Mother   . Diabetes Mother   . Irritable bowel syndrome Mother   . Hypertension Maternal Grandmother   . Diabetes Maternal Grandmother   . Other Father        bile duct cancer  . Irritable bowel syndrome Maternal Aunt     SH:  ***  Review of Systems  PHYSICAL EXAMINATION:    LMP 04/27/2016 (Approximate)     General appearance: alert, cooperative and appears stated age Neck: no adenopathy, supple, symmetrical, trachea midline and thyroid {CHL  AMB PHY EX THYROID NORM DEFAULT:[phone]::"normal to inspection and palpation"} CV:  {Exam; heart brief:31539} Lungs:  {pe lungs ob:314451::"clear to auscultation, no wheezes, rales or rhonchi, symmetric air entry"} Breasts: {Exam; breast:13139::"normal appearance, no masses or tenderness"} Abdomen: soft, non-tender; bowel sounds normal; no masses,  no organomegaly Lymph:  no inguinal LAD noted  Pelvic: External genitalia:  no lesions              Urethra:  normal appearing urethra with no masses, tenderness or lesions              Bartholins and Skenes: normal                 Vagina: normal appearing vagina with normal color and discharge, no lesions              Cervix: {CHL AMB PHY EX CERVIX NORM DEFAULT:[phone]::"no lesions"}              Bimanual Exam:  Uterus:  {CHL AMB PHY EX UTERUS NORM DEFAULT:[phone]::"normal size, contour, position, consistency, mobility, non-tender"}              Adnexa: {CHL AMB PHY EX ADNEXA NO MASS [phone]::"no mass, fullness, tenderness"}              Rectovaginal: {yes no:314532}.  Confirms.              Anus:  normal sphincter tone, no lesions  Chaperone, ***Zenovia Jordan, CMA, was present for exam.  Assessment: ***  Plan: ***   ~{NUMBERS; -10-45 JOINT ROM:10287} minutes spent with patient >50% of time was in face to face discussion of above.

## 2020-03-04 ENCOUNTER — Telehealth: Payer: Self-pay | Admitting: *Deleted

## 2020-03-04 ENCOUNTER — Ambulatory Visit: Payer: Self-pay | Admitting: Obstetrics & Gynecology

## 2020-03-04 ENCOUNTER — Ambulatory Visit: Payer: BC Managed Care – PPO | Admitting: Obstetrics & Gynecology

## 2020-03-04 DIAGNOSIS — F410 Panic disorder [episodic paroxysmal anxiety] without agoraphobia: Secondary | ICD-10-CM | POA: Diagnosis not present

## 2020-03-04 DIAGNOSIS — F321 Major depressive disorder, single episode, moderate: Secondary | ICD-10-CM | POA: Diagnosis not present

## 2020-03-04 NOTE — Telephone Encounter (Signed)
Patient rescheduled today because she sick. Rescheduled to Monday 5/10.

## 2020-03-04 NOTE — Progress Notes (Signed)
GYNECOLOGY  VISIT  CC:   Recheck estrogen cream use  HPI: 44 y.o. G0P0000 Married White or Caucasian female here for follow up after estrace vaginal cream use.  She has hx of dyspareunia.  Pt reports pain is better but there is still some present.  She is using the estrogen cream but not faithfully.  Also, not very sexually active at this point due to all the psychological and physical stressors that occurred with the hospitalization when all her medications were stopped cold [place].  She is feeling better, at this point, finally.  Saw Dr. Anne Hahn for a second opinion.  Felt like she was a bother by the medical assistance before she saw Dr. Anne Hahn.  Will be seeing Dr. Karel Jarvis at the end of the month.  She is seeing a psychologist and psychiatrist.  She feels the therapy has been really good for her.  The therapist is a really good fit for her.  Husband very supportive through all of this.    Denies vaginal bleeding.  GYNECOLOGIC HISTORY: Patient's last menstrual period was 04/27/2016 (approximate). Contraception: hysterectomy Menopausal hormone therapy: estradiol vaginal cream  Patient Active Problem List   Diagnosis Date Noted  . Anxiety 12/14/2016  . Endometriosis 04/25/2016  . PCOS (polycystic ovarian syndrome) 03/19/2016  . Epilepsy with partial complex seizures ([facility]) 01/21/2016  . Idiopathic generalized epilepsy ([facility]) 12/06/2015  . Migraine without aura and responsive to treatment 12/06/2015    Past Medical History:  Diagnosis Date  . Anxiety   . Depression   . Endometriosis   . GERD (gastroesophageal reflux disease)   . History of migraine headaches   . Seizures ([facility])    controlled with meds, last seizure 4-5 yrs ago    Past Surgical History:  Procedure Laterality Date  . CARPAL TUNNEL RELEASE Right 06/14/2015   Procedure: RIGHT CARPAL TUNNEL RELEASE ENDOSCOPIC;  Surgeon: Mack Hook, MD;  Location: [facility];  Service: Orthopedics;  Laterality: Right;   . COLONOSCOPY WITH PROPOFOL N/A 02/10/2013   Procedure: COLONOSCOPY WITH PROPOFOL;  Surgeon: Charolett Bumpers, MD;  [facility];  Service: Endoscopy;  Laterality: N/A;  . CYSTOSCOPY N/A 05/14/2016   Procedure: CYSTOSCOPY;  Surgeon: Jerene Bears, MD;  Location: [facility];  Service: Gynecology;  Laterality: N/A;  . DIAGNOSTIC LAPAROSCOPY  12/15/2009   uterosacral nerve ablation  . ELBOW SURGERY Left 2012  . LAPAROSCOPIC BILATERAL SALPINGECTOMY Bilateral 05/14/2016   Procedure: LAPAROSCOPIC BILATERAL SALPINGECTOMY;  Surgeon: Jerene Bears, MD;  Location: [facility];  Service: Gynecology;  Laterality: Bilateral;  . LAPAROSCOPIC HYSTERECTOMY N/A 05/14/2016   Procedure: HYSTERECTOMY TOTAL LAPAROSCOPIC;  Surgeon: Jerene Bears, MD;  Location: [facility];  Service: Gynecology;  Laterality: N/A;  . UMBILICAL HERNIA REPAIR     as an infant  . WISDOM TOOTH EXTRACTION    . YAG LASER APPLICATION  06/24/2006   vaporization of endometriosis    MEDS:   Current Outpatient Medications on File Prior to Visit  Medication Sig Dispense Refill  . Cholecalciferol (D3 VITAMIN PO) 1,000 Units.     Marland Kitchen estradiol (ESTRACE) 0.1 MG/GM vaginal cream Place 1gm pv twice weekly for 4-6 weeks 42.5 g 0  . famotidine (PEPCID) 20 MG tablet Take by mouth.    . fluticasone (FLONASE) 50 MCG/ACT nasal spray Place into the nose.    . ibuprofen (ADVIL,MOTRIN) 200 MG tablet Take 800 mg by mouth as needed for headache or moderate pain.     . Lactobacillus (PROBIOTIC ACIDOPHILUS PO)  Take 1 capsule by mouth daily.     Marland Kitchen lamoTRIgine (LAMICTAL) 100 MG tablet One in the morning and 1.5 in the evening (Patient taking differently: 100 mg. )    . loratadine (CLARITIN) 10 MG tablet Take 10 mg by mouth daily.     . Potassium 99 MG TABS Take 99 mg by mouth daily.     . sertraline (ZOLOFT) 100 MG tablet Take 1.5 tab =150 mg daily    . vitamin E 180 MG (400 UNITS) capsule daily.    Marland Kitchen zonisamide (ZONEGRAN) 100 MG capsule Take 5 capsules nightly      No current facility-administered medications on file prior to visit.    ALLERGIES: Wellbutrin [bupropion hcl], Escitalopram oxalate, and Topiramate  Family History  Problem Relation Age of Onset  . Hypertension Mother   . Diabetes Mother   . Irritable bowel syndrome Mother   . Hypertension Maternal Grandmother   . Diabetes Maternal Grandmother   . Other Father        bile duct cancer  . Irritable bowel syndrome Maternal Aunt     SH:  Married, non smoker  Review of Systems  Constitutional: Negative.   HENT: Negative.   Eyes: Negative.   Respiratory: Negative.   Cardiovascular: Negative.   Gastrointestinal: Negative.   Endocrine: Negative.   Genitourinary:       Pain with intercourse  Musculoskeletal: Negative.   Skin: Negative.   Allergic/Immunologic: Negative.   Neurological: Negative.   Psychiatric/Behavioral: Negative.     PHYSICAL EXAMINATION:    BP 112/70   Pulse 70   Temp 97.9 F (36.6 C) (Skin)   Resp 16   Wt 219 lb (99.3 kg)   LMP 04/27/2016 (Approximate)   BMI 36.44 kg/m     General appearance: alert, cooperative and appears stated age Lymph:  no inguinal LAD noted  Pelvic: External genitalia:  no lesions              Urethra:  normal appearing urethra with no masses, tenderness or lesions              Bartholins and Skenes: normal                 Vagina: normal appearing vagina with normal color and discharge, no lesions              Cervix: absent              Bimanual Exam:  Uterus:  uterus absent and tenderness along left pelvic floor              Adnexa: no mass, fullness, tenderness              Rectovaginal: No.  Chaperone, Terence Lux, CMA, was present for exam.  Assessment: Dyspareunia Left pelvic floor dysfunction H/o TLH/bilateral salpingectomy 7/17  Plan: Recommended pt continue to use vaginal estrogen cream at least once weekly.  Has rx and does not need RF right now Recommended considering pelvic PT again in the future.   She had similar symptoms after her hysterectomy and pelvic PT resolved symptoms.  She saw Ileana Roup at [facility], who is now retired, so pt and I discussed options. She will let me know when desirous of referral.   Approximately 20 minutes total spent with pt.

## 2020-03-07 ENCOUNTER — Encounter: Payer: Self-pay | Admitting: Obstetrics & Gynecology

## 2020-03-07 ENCOUNTER — Ambulatory Visit: Payer: BC Managed Care – PPO | Admitting: Obstetrics & Gynecology

## 2020-03-07 ENCOUNTER — Other Ambulatory Visit: Payer: Self-pay

## 2020-03-07 VITALS — BP 112/70 | HR 70 | Temp 97.9°F | Resp 16 | Wt 219.0 lb

## 2020-03-07 DIAGNOSIS — N941 Unspecified dyspareunia: Secondary | ICD-10-CM | POA: Diagnosis not present

## 2020-03-07 DIAGNOSIS — M6289 Other specified disorders of muscle: Secondary | ICD-10-CM | POA: Diagnosis not present

## 2020-03-10 DIAGNOSIS — Z79899 Other long term (current) drug therapy: Secondary | ICD-10-CM | POA: Diagnosis not present

## 2020-03-10 DIAGNOSIS — F321 Major depressive disorder, single episode, moderate: Secondary | ICD-10-CM | POA: Diagnosis not present

## 2020-03-10 DIAGNOSIS — F419 Anxiety disorder, unspecified: Secondary | ICD-10-CM | POA: Diagnosis not present

## 2020-03-10 DIAGNOSIS — F329 Major depressive disorder, single episode, unspecified: Secondary | ICD-10-CM | POA: Diagnosis not present

## 2020-03-10 DIAGNOSIS — F410 Panic disorder [episodic paroxysmal anxiety] without agoraphobia: Secondary | ICD-10-CM | POA: Diagnosis not present

## 2020-03-14 DIAGNOSIS — Z79899 Other long term (current) drug therapy: Secondary | ICD-10-CM | POA: Diagnosis not present

## 2020-03-14 DIAGNOSIS — F329 Major depressive disorder, single episode, unspecified: Secondary | ICD-10-CM | POA: Diagnosis not present

## 2020-03-14 DIAGNOSIS — F419 Anxiety disorder, unspecified: Secondary | ICD-10-CM | POA: Diagnosis not present

## 2020-03-25 ENCOUNTER — Encounter: Payer: Self-pay | Admitting: Neurology

## 2020-03-25 ENCOUNTER — Other Ambulatory Visit: Payer: Self-pay

## 2020-03-25 ENCOUNTER — Ambulatory Visit: Payer: BC Managed Care – PPO | Admitting: Neurology

## 2020-03-25 VITALS — BP 142/81 | HR 69 | Ht 65.0 in | Wt 219.2 lb

## 2020-03-25 DIAGNOSIS — G40319 Generalized idiopathic epilepsy and epileptic syndromes, intractable, without status epilepticus: Secondary | ICD-10-CM

## 2020-03-25 MED ORDER — ZONISAMIDE 100 MG PO CAPS: ORAL_CAPSULE | ORAL | 3 refills | Status: DC

## 2020-03-25 NOTE — Progress Notes (Signed)
NEUROLOGY CONSULTATION NOTE  April Campbell MRN: [id number] DOB: 25-Nov-1975  Referring provider: Jarrett Soho, PA-C Primary care provider: Dr. Beverley Fiedler  Reason for consult:  epilepsy  Thank you for your kind referral of April Campbell for consultation of the above symptoms. Although her history is well known to you, please allow me to reiterate it for the purpose of our medical record. The patient was accompanied to the clinic by her husband who also provides collateral information. Records and images were personally reviewed where available.   HISTORY OF PRESENT ILLNESS: This is a 44 year old right-handed woman with a history of anxiety, depression, migraines, presenting for a second opinion regarding epilepsy. Records from [facility] were reviewed. She was last seen at [facility] 2 months ago. She had a GTC in 2002 while on Wellbutrin, EEG at that time showed bifrontal spikes in drowsiness. She was started on Depakote but had side effects. She went to [facility] for a second opinion and had a normal MRI and EEG, tapered off Depakote. Seizures recurred in 2010 and she was started on Levetiracetam, however it was ineffective and she was changed to Zonisamide and Lamotrigine. She had a breakthrough seizure due to medication refill mixup in 2013 and was seizure-free until 2019 when Zonisamide was trial of Zonisamide taper was done. She started having recurrent episodes of feeling like a seizure was coming on with a feeling of warmth and everything around her going faster. She also reported deja vu feelings. She was reporting cognitive and word-finding issues, she felt she was overmedicated and had an EMU admission in February 2021 where Lamotrigine and Zonisamide were held. Typical events were not captured, however baseline EEG showed generalized 3 Hz polyspike and wave interictal activity. She was told that there was no need for LTG, on her follow-up in March 2021, she reported feeling very  anxious, especially about not being tapered off Lamotrigine and poor communication about restarting it. She felt a "lack of support" for her symptoms. Lamotrigine was restarted by her PCP to 100mg  in AM, 150mg  in PM, but now she is on 50mg  BID. She continues on Zonisamide 500mg  qhs with no side effects. She continues to report word-finding difficulties. She is feeling a lot better with reduction in Lamotrigine dose and follows up with Psychiatry. She denies any significant falls.  She states she has had 3 or 4 GTCs in her lifetime. She denies losing consciousness with most of her seizures, she describes sensations of high energy inside but everything around is moving in slow motion, "like I can see the future." Sometimes there is a buzzing, strong, heat, strong fear, there is always an intense sensation that started in her childhood. In HS this was attributed to caffeine, then she had the first GTC in her early 71s attributed to Wellbutrin. Her last GTC was in 2017. Seizure triggers include lack of sleep/food, stress. She reports that seizures had quieted down prior to this year with a lot going on. This past months she feels she is doing better, previously she was having smaller seizures once a week, lasting for a minute. She feels tired and shaken up after, no focal weakness. They deny any staring/unresponsive episodes, she can talk and respond. Her husband reports she twitches a lot in her sleep. She has migraines once a week and takes prn Advil and Cambia. There is occasional photophobia, no nausea/vomiting. She denies any dizziness, diplopia, dysarthria/dysphagia, bladder dysfunction. She has neck pain, occasional tingling in both  arms. She has constipation.   Prior AEDs: Topamax, Depakote, Keppra  Diagnostic Data: EMU admission Feb 1-4, 2021 at [facility]: Typical events were not captured. Baseline showed generalized 3 Hz PSW interictal activity. MRI brain in 2017 without contrast was normal.  PAST MEDICAL  HISTORY: Past Medical History:  Diagnosis Date  . Anxiety   . Depression   . Endometriosis   . GERD (gastroesophageal reflux disease)   . History of migraine headaches   . Seizures ([place])    controlled with meds, last seizure 4-5 yrs ago    PAST SURGICAL HISTORY: Past Surgical History:  Procedure Laterality Date  . CARPAL TUNNEL RELEASE Right 06/14/2015   Procedure: RIGHT CARPAL TUNNEL RELEASE ENDOSCOPIC;  Surgeon: Milly Jakob, MD;  Location: [place];  Service: Orthopedics;  Laterality: Right;  . COLONOSCOPY WITH PROPOFOL N/A 02/10/2013   Procedure: COLONOSCOPY WITH PROPOFOL;  Surgeon: Garlan Fair, MD;  Location: [facility];  Service: Endoscopy;  Laterality: N/A;  . CYSTOSCOPY N/A 05/14/2016   Procedure: CYSTOSCOPY;  Surgeon: Megan Salon, MD;  Location: [facility];  Service: Gynecology;  Laterality: N/A;  . DIAGNOSTIC LAPAROSCOPY  12/15/2009   uterosacral nerve ablation  . ELBOW SURGERY Left 2012  . LAPAROSCOPIC BILATERAL SALPINGECTOMY Bilateral 05/14/2016   Procedure: LAPAROSCOPIC BILATERAL SALPINGECTOMY;  Surgeon: Megan Salon, MD;  Location: [place] [facility];  Service: Gynecology;  Laterality: Bilateral;  . LAPAROSCOPIC HYSTERECTOMY N/A 05/14/2016   Procedure: HYSTERECTOMY TOTAL LAPAROSCOPIC;  Surgeon: Megan Salon, MD;  Location: [facility];  Service: Gynecology;  Laterality: N/A;  . UMBILICAL HERNIA REPAIR     as an infant  . WISDOM TOOTH EXTRACTION    . YAG LASER APPLICATION  1/76/1607   vaporization of endometriosis    MEDICATIONS: Current Outpatient Medications on File Prior to Visit  Medication Sig Dispense Refill  . Cholecalciferol (D3 VITAMIN PO) 1,000 Units.     Marland Kitchen estradiol (ESTRACE) 0.1 MG/GM vaginal cream Place 1gm pv twice weekly for 4-6 weeks 42.5 g 0  . famotidine (PEPCID) 20 MG tablet Take by mouth.    . fluticasone (FLONASE) 50 MCG/ACT nasal spray Place into the nose.    . ibuprofen (ADVIL,MOTRIN) 200 MG tablet Take 800 mg by mouth as needed for  headache or moderate pain.     . Lactobacillus (PROBIOTIC ACIDOPHILUS PO) Take 1 capsule by mouth daily.     Marland Kitchen lamoTRIgine (LAMICTAL) 100 MG tablet One in the morning and 1.5 in the evening (Patient taking differently: 100 mg. Takes 50 mg in the morning 50 mg at night)    . loratadine (CLARITIN) 10 MG tablet Take 10 mg by mouth daily.     . Potassium 99 MG TABS Take 99 mg by mouth daily.     . sertraline (ZOLOFT) 100 MG tablet Take 1.5 tab =150 mg daily    . vitamin E 180 MG (400 UNITS) capsule daily.    Marland Kitchen zonisamide (ZONEGRAN) 100 MG capsule Take 5 capsules nightly     No current facility-administered medications on file prior to visit.    ALLERGIES: Allergies  Allergen Reactions  . Wellbutrin [Bupropion Hcl] Other (See Comments)    SEIZURE  . Escitalopram Oxalate Other (See Comments)    Decreased sex drive  . Topiramate Other (See Comments)    foggy    FAMILY HISTORY: Family History  Problem Relation Age of Onset  . Hypertension Mother   . Diabetes Mother   . Irritable bowel syndrome Mother   . Hypertension  Maternal Grandmother   . Diabetes Maternal Grandmother   . Other Father        bile duct cancer  . Irritable bowel syndrome Maternal Aunt     SOCIAL HISTORY: Social History   Socioeconomic History  . Marital status: Married    Spouse name: Not on file  . Number of children: 0  . Years of education: Not on file  . Highest education level: Not on file  Occupational History  . Occupation: Film/video editor in [employer]  Tobacco Use  . Smoking status: Never Smoker  . Smokeless tobacco: Never Used  Substance and Sexual Activity  . Alcohol use: Yes    Alcohol/week: 2.0 standard drinks    Types: 2 Standard drinks or equivalent per week  . Drug use: No  . Sexual activity: Yes    Partners: Male    Birth control/protection: Surgical    Comment: husband vasectomy/ Hysterectomy  Other Topics Concern  . Not on file  Social History Narrative   Right handed      Lives with husband    Social Determinants of Health   Financial Resource Strain:   . Difficulty of Paying Living Expenses:   Food Insecurity:   . Worried About Programme researcher, broadcasting/film/video in the Last Year:   . Barista in the Last Year:   Transportation Needs:   . Freight forwarder (Medical):   Marland Kitchen Lack of Transportation (Non-Medical):   Physical Activity:   . Days of Exercise per Week:   . Minutes of Exercise per Session:   Stress:   . Feeling of Stress :   Social Connections:   . Frequency of Communication with Friends and Family:   . Frequency of Social Gatherings with Friends and Family:   . Attends Religious Services:   . Active Member of Clubs or Organizations:   . Attends Banker Meetings:   Marland Kitchen Marital Status:   Intimate Partner Violence:   . Fear of Current or Ex-Partner:   . Emotionally Abused:   Marland Kitchen Physically Abused:   . Sexually Abused:     REVIEW OF SYSTEMS: Constitutional: No fevers, chills, or sweats, no generalized fatigue, change in appetite Eyes: No visual changes, double vision, eye pain Ear, nose and throat: No hearing loss, ear pain, nasal congestion, sore throat Cardiovascular: No chest pain, palpitations Respiratory:  No shortness of breath at rest or with exertion, wheezes GastrointestinaI: No nausea, vomiting, diarrhea, abdominal pain, fecal incontinence Genitourinary:  No dysuria, urinary retention or frequency Musculoskeletal:  + neck pain,no back pain Integumentary: No rash, pruritus, skin lesions Neurological: as above Psychiatric: + depression,anxiety Endocrine: No palpitations, fatigue, diaphoresis, mood swings, change in appetite, change in weight, increased thirst Hematologic/Lymphatic:  No anemia, purpura, petechiae. Allergic/Immunologic: no itchy/runny eyes, nasal congestion, recent allergic reactions, rashes  PHYSICAL EXAM: Vitals:   03/25/20 1019  BP: (!) 142/81  Pulse: 69  SpO2: 100%   General: No acute  distress Head:  Normocephalic/atraumatic Skin/Extremities: No rash, no edema Neurological Exam: Mental status: alert and oriented to [place], place, and time, no dysarthria or aphasia, Fund of knowledge is appropriate.  Recent and remote memory are intact. 3/3 delayed recall. Attention and concentration are normal.   Cranial nerves: CN I: not tested CN II: pupils equal, round and reactive to light, visual fields intact CN III, IV, VI:  full range of motion, no nystagmus, no ptosis CN V: facial sensation intact CN VII: upper and lower face symmetric CN  VIII: hearing intact to conversation Bulk & Tone: normal, no fasciculations. Motor: 5/5 throughout with no pronator drift. Sensation: intact to light touch, cold, pin, vibration and joint position sense.  No extinction to double simultaneous stimulation.  Romberg test negative Deep Tendon Reflexes: brisk +3 throughout, no ankle clonus, +Hoffman sign on right Plantar responses: downgoing bilaterally Cerebellar: no incoordination on finger to nose testing Gait: narrow-based and steady, able to tandem walk adequately. Tremor: none   IMPRESSION: This is a 44 year old right-handed woman with a history of primary generalized epilepsy presenting to establish care. EEG showed 3 Hz polyspike and wave discharges, MRI brain normal. She had significant worsening of depression and anxiety with sudden discontinuation of Lamotrigine during EMU admission in February, she feels better on Lamotrigine 50mg  BID and continues follow-up with Psychiatry. Her last GTC was in 2017. She is satisfied with current seizure control, she denies any loss of consciousness with smaller seizures. Continue Zonisamide 500mg  qhs.  driving laws were discussed with the patient, and she knows to stop driving after a seizure, until 6 months seizure-free. She was advised to keep a seizure calendar, follow-up in 4 months, they know to call for any changes.   Thank you for allowing me to  participate in the care of this patient. Please do not hesitate to call for any questions or concerns.   2018, M.D.  CC: , PA-C, Dr. Patrcia Dolly

## 2020-03-25 NOTE — Patient Instructions (Signed)
Good to meet you! Continue Zonisamide 500mg  every night. Continue follow-up with Psychiatry and therapy. Keep a seizure calendar, follow-up in 4 months, call for any changes.  Seizure Precautions: 1. If medication has been prescribed for you to prevent seizures, take it exactly as directed.  Do not stop taking the medicine without talking to your doctor first, even if you have not had a seizure in a long time.   2. Avoid activities in which a seizure would cause danger to yourself or to others.  Don't operate dangerous machinery, swim alone, or climb in high or dangerous places, such as on ladders, roofs, or girders.  Do not drive unless your doctor says you may.  3. If you have any warning that you may have a seizure, lay down in a safe place where you can't hurt yourself.    4.  No driving for 6 months from last seizure, as per [facility].   Please refer to the following link on the Epilepsy Foundation of America's website for more information: http://www.epilepsyfoundation.org/answerplace/Social/driving/drivingu.cfm   5.  Maintain good sleep hygiene. Avoid alcohol.  6.  Notify your neurology if you are planning pregnancy or if you become pregnant.  7.  Contact your doctor if you have any problems that may be related to the medicine you are taking.  8.  Call 911 and bring the patient back to the ED if:        A.  The seizure lasts longer than 5 minutes.       B.  The patient doesn't awaken shortly after the seizure  C.  The patient has new problems such as difficulty seeing, speaking or moving  D.  The patient was injured during the seizure  E.  The patient has a temperature over 102 F (39C)  F.  The patient vomited and now is having trouble breathing

## 2020-04-04 DIAGNOSIS — G8929 Other chronic pain: Secondary | ICD-10-CM | POA: Diagnosis not present

## 2020-04-04 DIAGNOSIS — H9203 Otalgia, bilateral: Secondary | ICD-10-CM | POA: Diagnosis not present

## 2020-04-06 ENCOUNTER — Encounter (HOSPITAL_COMMUNITY): Payer: Self-pay | Admitting: Emergency Medicine

## 2020-04-06 ENCOUNTER — Emergency Department (HOSPITAL_COMMUNITY)
Admission: EM | Admit: 2020-04-06 | Discharge: 2020-04-06 | Disposition: A | Discharge status: Home / Self Care | Payer: BC Managed Care – PPO | Attending: Emergency Medicine | Admitting: Emergency Medicine

## 2020-04-06 DIAGNOSIS — H9203 Otalgia, bilateral: Secondary | ICD-10-CM | POA: Diagnosis not present

## 2020-04-06 MED ORDER — FLUTICASONE PROPIONATE 50 MCG/ACT NA SUSP: 2.0000 | Freq: Every day | NASAL | 0 refills | Status: DC

## 2020-04-06 NOTE — ED Provider Notes (Signed)
[place] EMERGENCY DEPARTMENT Provider Note   CSN: [id number] Arrival date & time: 04/06/20  1015     History No chief complaint on file.   April Campbell is a 44 y.o. female.  HPI 44 year old female presents with bilateral ear pain.  On and off over the last couple weeks but now more constant over the last few days.  Saw her PCP 2 days ago and put on Omnicef.  No fevers.  No ear drainage.  Has had a little bit of congestion and some drainage in her throat.  Has ear nose throat follow-up set for later this month but the pain was worse today.  Has taken some ibuprofen and Tylenol. No change in hearing.   Past Medical History:  Diagnosis Date  . Anxiety   . Depression   . Endometriosis   . GERD (gastroesophageal reflux disease)   . History of migraine headaches   . Seizures ([place])    controlled with meds, last seizure 4-5 yrs ago    Patient Active Problem List   Diagnosis Date Noted  . Anxiety 12/14/2016  . Endometriosis 04/25/2016  . PCOS (polycystic ovarian syndrome) 03/19/2016  . Epilepsy with partial complex seizures ([place]) 01/21/2016  . Idiopathic generalized epilepsy ([place]) 12/06/2015  . Migraine without aura and responsive to treatment 12/06/2015    Past Surgical History:  Procedure Laterality Date  . CARPAL TUNNEL RELEASE Right 06/14/2015   Procedure: RIGHT CARPAL TUNNEL RELEASE ENDOSCOPIC;  Surgeon: Milly Jakob, MD;  Location: [place];  Service: Orthopedics;  Laterality: Right;  . COLONOSCOPY WITH PROPOFOL N/A 02/10/2013   Procedure: COLONOSCOPY WITH PROPOFOL;  Surgeon: Garlan Fair, MD;  [facility];  Service: Endoscopy;  Laterality: N/A;  . CYSTOSCOPY N/A 05/14/2016   Procedure: CYSTOSCOPY;  Surgeon: Megan Salon, MD;  Location: [facility];  Service: Gynecology;  Laterality: N/A;  . DIAGNOSTIC LAPAROSCOPY  12/15/2009   uterosacral nerve ablation  . ELBOW SURGERY Left 2012  . LAPAROSCOPIC BILATERAL SALPINGECTOMY  Bilateral 05/14/2016   Procedure: LAPAROSCOPIC BILATERAL SALPINGECTOMY;  Surgeon: Megan Salon, MD;  Location: [facility] [place] [facility];  Service: Gynecology;  Laterality: Bilateral;  . LAPAROSCOPIC HYSTERECTOMY N/A 05/14/2016   Procedure: HYSTERECTOMY TOTAL LAPAROSCOPIC;  Surgeon: Megan Salon, MD;  Location: [facility];  Service: Gynecology;  Laterality: N/A;  . UMBILICAL HERNIA REPAIR     as an infant  . WISDOM TOOTH EXTRACTION    . YAG LASER APPLICATION  02/04/8118   vaporization of endometriosis     OB History    Gravida  0   Para  0   Term  0   Preterm  0   AB  0   Living  0     SAB  0   TAB  0   Ectopic  0   Multiple  0   Live Births              Family History  Problem Relation Age of Onset  . Hypertension Mother   . Diabetes Mother   . Irritable bowel syndrome Mother   . Hypertension Maternal Grandmother   . Diabetes Maternal Grandmother   . Other Father        bile duct cancer  . Irritable bowel syndrome Maternal Aunt     Social History   Tobacco Use  . Smoking status: Never Smoker  . Smokeless tobacco: Never Used  Substance Use Topics  . Alcohol use: Yes    Alcohol/week: 2.0  standard drinks    Types: 2 Standard drinks or equivalent per week  . Drug use: No    Home Medications Prior to Admission medications   Medication Sig Start Date End Date Taking? Authorizing Provider  Cholecalciferol (D3 VITAMIN PO) 1,000 Units.  10/29/18   [provider]  estradiol (ESTRACE) 0.1 MG/GM vaginal cream Place 1gm pv twice weekly for 4-6 weeks 01/08/20   Jerene Bears, MD  famotidine (PEPCID) 20 MG tablet Take by mouth.    [provider]  fluticasone (FLONASE) 50 MCG/ACT nasal spray Place 2 sprays into both nostrils daily. 04/06/20   Pricilla Loveless, MD  ibuprofen (ADVIL,MOTRIN) 200 MG tablet Take 800 mg by mouth as needed for headache or moderate pain.     [provider]  Lactobacillus (PROBIOTIC ACIDOPHILUS PO) Take 1 capsule by mouth daily.      [provider]  lamoTRIgine (LAMICTAL) 100 MG tablet One in the morning and 1.5 in the evening Patient taking differently: 100 mg. Takes 50 mg in the morning 50 mg at night 02/09/20   York Spaniel, MD  loratadine (CLARITIN) 10 MG tablet Take 10 mg by mouth daily.     [provider]  Potassium 99 MG TABS Take 99 mg by mouth daily.     [provider]  sertraline (ZOLOFT) 100 MG tablet Take 1.5 tab =150 mg daily 03/03/20   [provider]  vitamin E 180 MG (400 UNITS) capsule daily.    [provider]  zonisamide (ZONEGRAN) 100 MG capsule Take 5 capsules nightly 03/25/20   Van Clines, MD    Allergies    Wellbutrin [bupropion hcl], Escitalopram oxalate, and Topiramate  Review of Systems   Review of Systems  Constitutional: Negative for fever.  HENT: Positive for congestion and ear pain.   Respiratory: Negative for cough.     Physical Exam Updated Vital Signs BP 134/78 (BP Location: Left Arm)   Pulse 68   Temp 98.1 F (36.7 C) (Oral)   Resp 18   LMP 04/27/2016 (Approximate)   SpO2 100%   Physical Exam Vitals and nursing note reviewed.  Constitutional:      General: She is not in acute distress.    Appearance: She is well-developed. She is not ill-appearing or diaphoretic.  HENT:     Head: Normocephalic and atraumatic.     Right Ear: Tympanic membrane, ear canal and external ear normal. No tenderness. No mastoid tenderness.     Left Ear: Tympanic membrane, ear canal and external ear normal. No tenderness. No mastoid tenderness.     Nose: Nose normal.  Eyes:     General:        Right eye: No discharge.        Left eye: No discharge.  Pulmonary:     Effort: Pulmonary effort is normal.  Abdominal:     General: There is no distension.  Skin:    General: Skin is warm and dry.  Neurological:     Mental Status: She is alert.  Psychiatric:        Mood and Affect: Mood is not anxious.     ED Results / Procedures /  Treatments   Labs (all labs ordered are listed, but only abnormal results are displayed) Labs Reviewed - No data to display  EKG None  Radiology No results found.  Procedures Procedures (including critical care time)  Medications Ordered in ED Medications - No data to display  ED Course  I have reviewed the triage vital signs and the nursing notes.  Pertinent labs & imaging results that were available during my care of the patient were reviewed by me and considered in my medical decision making (see chart for details).    MDM Rules/Calculators/A&P                      Patient overall appears well.  Vital signs are unremarkable. Perhaps some small amount behind her TM. Given congestion, will recommend flonase. She is already on claritin. Continue nsaids, tylenol and antibiotics. F/u with ENT. No mastoid tenderness. Final Clinical Impression(s) / ED Diagnoses Final diagnoses:  Acute ear pain, bilateral    Rx / DC Orders ED Discharge Orders         Ordered    fluticasone (FLONASE) 50 MCG/ACT nasal spray  Daily     04/06/20 1200           Pricilla Loveless, MD 04/06/20 1204

## 2020-04-06 NOTE — ED Triage Notes (Signed)
Pt here with c/o bil ear pain left hurts worse than right , pt is already on omincef day 2 and has a ent appointment in 1 month

## 2020-04-07 DIAGNOSIS — F410 Panic disorder [episodic paroxysmal anxiety] without agoraphobia: Secondary | ICD-10-CM | POA: Diagnosis not present

## 2020-04-07 DIAGNOSIS — F321 Major depressive disorder, single episode, moderate: Secondary | ICD-10-CM | POA: Diagnosis not present

## 2020-04-12 ENCOUNTER — Ambulatory Visit (INDEPENDENT_AMBULATORY_CARE_PROVIDER_SITE_OTHER): Payer: BC Managed Care – PPO | Admitting: Otolaryngology

## 2020-04-12 ENCOUNTER — Encounter (INDEPENDENT_AMBULATORY_CARE_PROVIDER_SITE_OTHER): Payer: Self-pay | Admitting: Otolaryngology

## 2020-04-12 ENCOUNTER — Other Ambulatory Visit: Payer: Self-pay

## 2020-04-12 VITALS — Temp 98.2°F

## 2020-04-12 DIAGNOSIS — H9203 Otalgia, bilateral: Secondary | ICD-10-CM

## 2020-04-12 NOTE — Progress Notes (Signed)
HPI: April Campbell is a 44 y.o. female who presents for evaluation of bilateral ear pain left little bit worse than right.  She saw her primary care physician 1 week ago Monday who did not see anything but treat her with antibiotic, Omnicef..  The pain became worse and she went to the ED last Wednesday and was treated with Sudafed and Flonase and this seemed to help a little bit.  She still feels like she she has ear discomfort left side worse than right.  She denies any hearing problems.  No drainage from her ears. When she initially developed ear pain she thought it was stress related.  Past Medical History:  Diagnosis Date  . Anxiety   . Depression   . Endometriosis   . GERD (gastroesophageal reflux disease)   . History of migraine headaches   . Seizures (HCC)    controlled with meds, last seizure 4-5 yrs ago   Past Surgical History:  Procedure Laterality Date  . CARPAL TUNNEL RELEASE Right 06/14/2015   Procedure: RIGHT CARPAL TUNNEL RELEASE ENDOSCOPIC;  Surgeon: Mack Hook, MD;  Location: [facility];  Service: Orthopedics;  Laterality: Right;  . COLONOSCOPY WITH PROPOFOL N/A 02/10/2013   Procedure: COLONOSCOPY WITH PROPOFOL;  Surgeon: Charolett Bumpers, MD;  Location: [facility];  Service: Endoscopy;  Laterality: N/A;  . CYSTOSCOPY N/A 05/14/2016   Procedure: CYSTOSCOPY;  Surgeon: Jerene Bears, MD;  Location: [facility];  Service: Gynecology;  Laterality: N/A;  . DIAGNOSTIC LAPAROSCOPY  12/15/2009   uterosacral nerve ablation  . ELBOW SURGERY Left 2012  . LAPAROSCOPIC BILATERAL SALPINGECTOMY Bilateral 05/14/2016   Procedure: LAPAROSCOPIC BILATERAL SALPINGECTOMY;  Surgeon: Jerene Bears, MD;  Location: [facility];  Service: Gynecology;  Laterality: Bilateral;  . LAPAROSCOPIC HYSTERECTOMY N/A 05/14/2016   Procedure: HYSTERECTOMY TOTAL LAPAROSCOPIC;  Surgeon: Jerene Bears, MD;  Location: [facility];  Service: Gynecology;  Laterality: N/A;  . UMBILICAL HERNIA REPAIR     as an  infant  . WISDOM TOOTH EXTRACTION    . YAG LASER APPLICATION  06/24/2006   vaporization of endometriosis   Social History   Socioeconomic History  . Marital status: Married    Spouse name: Not on file  . Number of children: 0  . Years of education: Not on file  . Highest education level: Not on file  Occupational History  . Occupation: Film/video editor in [employer]  Tobacco Use  . Smoking status: Never Smoker  . Smokeless tobacco: Never Used  Vaping Use  . Vaping Use: Never used  Substance and Sexual Activity  . Alcohol use: Yes    Alcohol/week: 2.0 standard drinks    Types: 2 Standard drinks or equivalent per week  . Drug use: No  . Sexual activity: Yes    Partners: Male    Birth control/protection: Surgical    Comment: husband vasectomy/ Hysterectomy  Other Topics Concern  . Not on file  Social History Narrative   Right handed    Lives with husband    Social Determinants of Health   Financial Resource Strain:   . Difficulty of Paying Living Expenses:   Food Insecurity:   . Worried About Programme researcher, broadcasting/film/video in the Last Year:   . Barista in the Last Year:   Transportation Needs:   . Freight forwarder (Medical):   Marland Kitchen Lack of Transportation (Non-Medical):   Physical Activity:   . Days of Exercise per Week:   . Minutes  of Exercise per Session:   Stress:   . Feeling of Stress :   Social Connections:   . Frequency of Communication with Friends and Family:   . Frequency of Social Gatherings with Friends and Family:   . Attends Religious Services:   . Active Member of Clubs or Organizations:   . Attends Archivist Meetings:   Marland Kitchen Marital Status:    Family History  Problem Relation Age of Onset  . Hypertension Mother   . Diabetes Mother   . Irritable bowel syndrome Mother   . Hypertension Maternal Grandmother   . Diabetes Maternal Grandmother   . Other Father        bile duct cancer  . Irritable bowel syndrome Maternal Aunt     Allergies  Allergen Reactions  . Wellbutrin [Bupropion Hcl] Other (See Comments)    SEIZURE  . Escitalopram Oxalate Other (See Comments)    Decreased sex drive  . Topiramate Other (See Comments)    foggy   Prior to Admission medications   Medication Sig Start Date End Date Taking? Authorizing Provider  Cholecalciferol (D3 VITAMIN PO) 1,000 Units.  10/29/18  Yes [provider]  estradiol (ESTRACE) 0.1 MG/GM vaginal cream Place 1gm pv twice weekly for 4-6 weeks 01/08/20  Yes Megan Salon, MD  famotidine (PEPCID) 20 MG tablet Take by mouth.   Yes [provider]  fluticasone (FLONASE) 50 MCG/ACT nasal spray Place 2 sprays into both nostrils daily. 04/06/20  Yes Sherwood Gambler, MD  ibuprofen (ADVIL,MOTRIN) 200 MG tablet Take 800 mg by mouth as needed for headache or moderate pain.    Yes [provider]  Lactobacillus (PROBIOTIC ACIDOPHILUS PO) Take 1 capsule by mouth daily.    Yes [provider]  lamoTRIgine (LAMICTAL) 100 MG tablet One in the morning and 1.5 in the evening Patient taking differently: 100 mg. Takes 50 mg in the morning 50 mg at night 02/09/20  Yes Kathrynn Ducking, MD  loratadine (CLARITIN) 10 MG tablet Take 10 mg by mouth daily.    Yes [provider]  Potassium 99 MG TABS Take 99 mg by mouth daily.    Yes [provider]  sertraline (ZOLOFT) 100 MG tablet Take 1.5 tab =150 mg daily 03/03/20  Yes [provider]  vitamin E 180 MG (400 UNITS) capsule daily.   Yes [provider]  zonisamide (ZONEGRAN) 100 MG capsule Take 5 capsules nightly 03/25/20  Yes Cameron Sprang, MD     Positive ROS: Otherwise negative  All other systems have been reviewed and were otherwise negative with the exception of those mentioned in the HPI and as above.  Physical Exam: Constitutional: Alert, well-appearing, no acute distress Ears: External ears without lesions or tenderness. Ear canals are clear bilaterally.  On  microscopic exam TMs are clear bilaterally with good mobility pneumatic otoscopy.  Hearing screening with the tuning forks revealed symmetric hearing with AC > BC bilaterally. Nasal: External nose without lesions.. Clear nasal passages bilaterally. Oral: Lips and gums without lesions. Tongue and palate mucosa without lesions. Posterior oropharynx clear.  Tonsils appear benign bilaterally are average size with no exudate.  Indirect laryngoscopy revealed a clear base of tongue vallecula and epiglottis. Neck: No palpable adenopathy or masses on either side of the neck.  Manual manipulation of the ears is nontender.  Evaluation of the TMJ region reveals no tenderness in the region of the TMJ and no swelling. Respiratory: Breathing comfortably  Skin: No facial/neck lesions  or rash noted.  Procedures  Assessment: Ear pain questionable etiology.  On clinical exam no signs of infection with clear TMs bilaterally and normal hearing.  External ear canals are clear. Could possibly be related to stress or TMJ.  Plan: I discussed with patient today that I cannot find any cause or etiology of her ear pain.  There is no clinical evidence of infection. Suggested initially use of NSAIDs such as ibuprofen on a regular basis for 7 to 10 days. If she notices any change in her hearing she will follow-up for recheck.  Narda Bonds, MD

## 2020-04-14 DIAGNOSIS — F321 Major depressive disorder, single episode, moderate: Secondary | ICD-10-CM | POA: Diagnosis not present

## 2020-04-14 DIAGNOSIS — F410 Panic disorder [episodic paroxysmal anxiety] without agoraphobia: Secondary | ICD-10-CM | POA: Diagnosis not present

## 2020-04-21 DIAGNOSIS — F321 Major depressive disorder, single episode, moderate: Secondary | ICD-10-CM | POA: Diagnosis not present

## 2020-04-21 DIAGNOSIS — F410 Panic disorder [episodic paroxysmal anxiety] without agoraphobia: Secondary | ICD-10-CM | POA: Diagnosis not present

## 2020-04-28 DIAGNOSIS — F410 Panic disorder [episodic paroxysmal anxiety] without agoraphobia: Secondary | ICD-10-CM | POA: Diagnosis not present

## 2020-04-28 DIAGNOSIS — F321 Major depressive disorder, single episode, moderate: Secondary | ICD-10-CM | POA: Diagnosis not present

## 2020-05-05 DIAGNOSIS — F321 Major depressive disorder, single episode, moderate: Secondary | ICD-10-CM | POA: Diagnosis not present

## 2020-05-05 DIAGNOSIS — F410 Panic disorder [episodic paroxysmal anxiety] without agoraphobia: Secondary | ICD-10-CM | POA: Diagnosis not present

## 2020-05-12 DIAGNOSIS — F410 Panic disorder [episodic paroxysmal anxiety] without agoraphobia: Secondary | ICD-10-CM | POA: Diagnosis not present

## 2020-05-12 DIAGNOSIS — F321 Major depressive disorder, single episode, moderate: Secondary | ICD-10-CM | POA: Diagnosis not present

## 2020-05-19 DIAGNOSIS — F329 Major depressive disorder, single episode, unspecified: Secondary | ICD-10-CM | POA: Diagnosis not present

## 2020-05-19 DIAGNOSIS — Z79899 Other long term (current) drug therapy: Secondary | ICD-10-CM | POA: Diagnosis not present

## 2020-05-19 DIAGNOSIS — F419 Anxiety disorder, unspecified: Secondary | ICD-10-CM | POA: Diagnosis not present

## 2020-05-26 DIAGNOSIS — F321 Major depressive disorder, single episode, moderate: Secondary | ICD-10-CM | POA: Diagnosis not present

## 2020-05-26 DIAGNOSIS — F410 Panic disorder [episodic paroxysmal anxiety] without agoraphobia: Secondary | ICD-10-CM | POA: Diagnosis not present

## 2020-06-01 DIAGNOSIS — Z713 Dietary counseling and surveillance: Secondary | ICD-10-CM | POA: Diagnosis not present

## 2020-06-09 DIAGNOSIS — F410 Panic disorder [episodic paroxysmal anxiety] without agoraphobia: Secondary | ICD-10-CM | POA: Diagnosis not present

## 2020-06-09 DIAGNOSIS — F321 Major depressive disorder, single episode, moderate: Secondary | ICD-10-CM | POA: Diagnosis not present

## 2020-06-15 DIAGNOSIS — M9901 Segmental and somatic dysfunction of cervical region: Secondary | ICD-10-CM | POA: Diagnosis not present

## 2020-06-15 DIAGNOSIS — M9903 Segmental and somatic dysfunction of lumbar region: Secondary | ICD-10-CM | POA: Diagnosis not present

## 2020-06-15 DIAGNOSIS — M5412 Radiculopathy, cervical region: Secondary | ICD-10-CM | POA: Diagnosis not present

## 2020-06-16 DIAGNOSIS — F321 Major depressive disorder, single episode, moderate: Secondary | ICD-10-CM | POA: Diagnosis not present

## 2020-06-16 DIAGNOSIS — F410 Panic disorder [episodic paroxysmal anxiety] without agoraphobia: Secondary | ICD-10-CM | POA: Diagnosis not present

## 2020-06-20 DIAGNOSIS — J029 Acute pharyngitis, unspecified: Secondary | ICD-10-CM | POA: Diagnosis not present

## 2020-06-22 DIAGNOSIS — Z20822 Contact with and (suspected) exposure to covid-19: Secondary | ICD-10-CM | POA: Diagnosis not present

## 2020-06-23 DIAGNOSIS — F321 Major depressive disorder, single episode, moderate: Secondary | ICD-10-CM | POA: Diagnosis not present

## 2020-06-23 DIAGNOSIS — F410 Panic disorder [episodic paroxysmal anxiety] without agoraphobia: Secondary | ICD-10-CM | POA: Diagnosis not present

## 2020-06-27 DIAGNOSIS — M9901 Segmental and somatic dysfunction of cervical region: Secondary | ICD-10-CM | POA: Diagnosis not present

## 2020-06-27 DIAGNOSIS — M9902 Segmental and somatic dysfunction of thoracic region: Secondary | ICD-10-CM | POA: Diagnosis not present

## 2020-06-27 DIAGNOSIS — M530 Cervicocranial syndrome: Secondary | ICD-10-CM | POA: Diagnosis not present

## 2020-06-27 DIAGNOSIS — M9903 Segmental and somatic dysfunction of lumbar region: Secondary | ICD-10-CM | POA: Diagnosis not present

## 2020-06-28 DIAGNOSIS — M9902 Segmental and somatic dysfunction of thoracic region: Secondary | ICD-10-CM | POA: Diagnosis not present

## 2020-06-28 DIAGNOSIS — M9901 Segmental and somatic dysfunction of cervical region: Secondary | ICD-10-CM | POA: Diagnosis not present

## 2020-06-28 DIAGNOSIS — M530 Cervicocranial syndrome: Secondary | ICD-10-CM | POA: Diagnosis not present

## 2020-06-28 DIAGNOSIS — M9903 Segmental and somatic dysfunction of lumbar region: Secondary | ICD-10-CM | POA: Diagnosis not present

## 2020-06-29 DIAGNOSIS — M9903 Segmental and somatic dysfunction of lumbar region: Secondary | ICD-10-CM | POA: Diagnosis not present

## 2020-06-29 DIAGNOSIS — M9902 Segmental and somatic dysfunction of thoracic region: Secondary | ICD-10-CM | POA: Diagnosis not present

## 2020-06-29 DIAGNOSIS — M9901 Segmental and somatic dysfunction of cervical region: Secondary | ICD-10-CM | POA: Diagnosis not present

## 2020-06-29 DIAGNOSIS — M530 Cervicocranial syndrome: Secondary | ICD-10-CM | POA: Diagnosis not present

## 2020-06-30 DIAGNOSIS — F321 Major depressive disorder, single episode, moderate: Secondary | ICD-10-CM | POA: Diagnosis not present

## 2020-06-30 DIAGNOSIS — F410 Panic disorder [episodic paroxysmal anxiety] without agoraphobia: Secondary | ICD-10-CM | POA: Diagnosis not present

## 2020-07-06 DIAGNOSIS — M9903 Segmental and somatic dysfunction of lumbar region: Secondary | ICD-10-CM | POA: Diagnosis not present

## 2020-07-06 DIAGNOSIS — M9901 Segmental and somatic dysfunction of cervical region: Secondary | ICD-10-CM | POA: Diagnosis not present

## 2020-07-06 DIAGNOSIS — M530 Cervicocranial syndrome: Secondary | ICD-10-CM | POA: Diagnosis not present

## 2020-07-06 DIAGNOSIS — M9902 Segmental and somatic dysfunction of thoracic region: Secondary | ICD-10-CM | POA: Diagnosis not present

## 2020-07-11 DIAGNOSIS — M9902 Segmental and somatic dysfunction of thoracic region: Secondary | ICD-10-CM | POA: Diagnosis not present

## 2020-07-11 DIAGNOSIS — M530 Cervicocranial syndrome: Secondary | ICD-10-CM | POA: Diagnosis not present

## 2020-07-11 DIAGNOSIS — M9901 Segmental and somatic dysfunction of cervical region: Secondary | ICD-10-CM | POA: Diagnosis not present

## 2020-07-11 DIAGNOSIS — M9903 Segmental and somatic dysfunction of lumbar region: Secondary | ICD-10-CM | POA: Diagnosis not present

## 2020-07-12 ENCOUNTER — Telehealth: Payer: Self-pay

## 2020-07-12 DIAGNOSIS — M9903 Segmental and somatic dysfunction of lumbar region: Secondary | ICD-10-CM | POA: Diagnosis not present

## 2020-07-12 DIAGNOSIS — M9901 Segmental and somatic dysfunction of cervical region: Secondary | ICD-10-CM | POA: Diagnosis not present

## 2020-07-12 DIAGNOSIS — M9902 Segmental and somatic dysfunction of thoracic region: Secondary | ICD-10-CM | POA: Diagnosis not present

## 2020-07-12 DIAGNOSIS — M530 Cervicocranial syndrome: Secondary | ICD-10-CM | POA: Diagnosis not present

## 2020-07-12 NOTE — Telephone Encounter (Signed)
Patient would like to be seen for a vaginal cyst.

## 2020-07-12 NOTE — Telephone Encounter (Signed)
AEX 10/2019 Hysterectomy, no HRT Hx +BV, x 5 in past 3 yrs.  Spoke with pt. Pt states noticing a labial bump/cyst on left side externally a few days ago. Had same bump that ruptured on right side of labia x 1 week ago. Pt denies any fever, chills, abd pain, vaginal bleeding. Pt states does not shave, no recent change in clothing or soaps. Pt denies any other vaginal sx of discharge or odor, itching.  Pt states area is hard and painful. Denies any HSV hx. Denies using or taking anything for treatment.   Advised pt to have OV for further evaluation. Pt agreeable. Pt scheduled for 07/15/20 at 4 pm with Dr Hyacinth Meeker. Pt agreeable to date and time of appt. Declines earlier offered work-in appts.   Encounter closed.

## 2020-07-13 DIAGNOSIS — M530 Cervicocranial syndrome: Secondary | ICD-10-CM | POA: Diagnosis not present

## 2020-07-13 DIAGNOSIS — M9901 Segmental and somatic dysfunction of cervical region: Secondary | ICD-10-CM | POA: Diagnosis not present

## 2020-07-13 DIAGNOSIS — M9902 Segmental and somatic dysfunction of thoracic region: Secondary | ICD-10-CM | POA: Diagnosis not present

## 2020-07-13 DIAGNOSIS — Z713 Dietary counseling and surveillance: Secondary | ICD-10-CM | POA: Diagnosis not present

## 2020-07-13 DIAGNOSIS — M9903 Segmental and somatic dysfunction of lumbar region: Secondary | ICD-10-CM | POA: Diagnosis not present

## 2020-07-14 DIAGNOSIS — F410 Panic disorder [episodic paroxysmal anxiety] without agoraphobia: Secondary | ICD-10-CM | POA: Diagnosis not present

## 2020-07-14 DIAGNOSIS — F321 Major depressive disorder, single episode, moderate: Secondary | ICD-10-CM | POA: Diagnosis not present

## 2020-07-15 ENCOUNTER — Encounter: Payer: Self-pay | Admitting: Obstetrics & Gynecology

## 2020-07-15 ENCOUNTER — Ambulatory Visit: Payer: BC Managed Care – PPO | Admitting: Obstetrics & Gynecology

## 2020-07-15 ENCOUNTER — Other Ambulatory Visit: Payer: Self-pay

## 2020-07-15 VITALS — BP 114/78 | HR 70 | Resp 16 | Wt 223.0 lb

## 2020-07-15 DIAGNOSIS — L723 Sebaceous cyst: Secondary | ICD-10-CM | POA: Diagnosis not present

## 2020-07-15 DIAGNOSIS — N764 Abscess of vulva: Secondary | ICD-10-CM

## 2020-07-15 MED ORDER — SULFAMETHOXAZOLE-TRIMETHOPRIM 800-160 MG PO TABS: 1.0000 | ORAL_TABLET | Freq: Two times a day (BID) | ORAL | 0 refills | Status: DC

## 2020-07-15 NOTE — Progress Notes (Signed)
GYNECOLOGY  VISIT  CC:   Labial bump/cyst  HPI: 44 y.o. G0P0000 Married White or Caucasian female here for labial bump/cyst that she'd like evaluated.  She has two areas that she wants assessed.  reports there was a single area on the right labia that became very tender and enlarged but it is better.  There is a second, draining area on the left.  If is also better but still tender.  Denies fever.  Also, feels a firm area that is not tender.  She does occasionally get a tender vulvar bump but these typically go away and she's never had two at a time.   GYNECOLOGIC HISTORY: Patient's last menstrual period was 04/27/2016 (approximate). Contraception: hysterectomy Menopausal hormone therapy: estrace cream occ  Patient Active Problem List   Diagnosis Date Noted  . Anxiety 12/14/2016  . Endometriosis 04/25/2016  . PCOS (polycystic ovarian syndrome) 03/19/2016  . Epilepsy with partial complex seizures (HCC) 01/21/2016  . Idiopathic generalized epilepsy (HCC) 12/06/2015  . Migraine without aura and responsive to treatment 12/06/2015    Past Medical History:  Diagnosis Date  . Anxiety   . Depression   . Endometriosis   . GERD (gastroesophageal reflux disease)   . History of migraine headaches   . Seizures (HCC)    controlled with meds, last seizure 4-5 yrs ago    Past Surgical History:  Procedure Laterality Date  . CARPAL TUNNEL RELEASE Right 06/14/2015   Procedure: RIGHT CARPAL TUNNEL RELEASE ENDOSCOPIC;  Surgeon: Mack Hook, MD;  Location: [facility];  Service: Orthopedics;  Laterality: Right;  . COLONOSCOPY WITH PROPOFOL N/A 02/10/2013   Procedure: COLONOSCOPY WITH PROPOFOL;  Surgeon: Charolett Bumpers, MD;  Location: [facility];  Service: Endoscopy;  Laterality: N/A;  . CYSTOSCOPY N/A 05/14/2016   Procedure: CYSTOSCOPY;  Surgeon: Jerene Bears, MD;  Location: [facility];  Service: Gynecology;  Laterality: N/A;  . DIAGNOSTIC LAPAROSCOPY  12/15/2009   uterosacral nerve  ablation  . ELBOW SURGERY Left 2012  . LAPAROSCOPIC BILATERAL SALPINGECTOMY Bilateral 05/14/2016   Procedure: LAPAROSCOPIC BILATERAL SALPINGECTOMY;  Surgeon: Jerene Bears, MD;  Location: [facility];  Service: Gynecology;  Laterality: Bilateral;  . LAPAROSCOPIC HYSTERECTOMY N/A 05/14/2016   Procedure: HYSTERECTOMY TOTAL LAPAROSCOPIC;  Surgeon: Jerene Bears, MD;  Location: [facility];  Service: Gynecology;  Laterality: N/A;  . UMBILICAL HERNIA REPAIR     as an infant  . WISDOM TOOTH EXTRACTION    . YAG LASER APPLICATION  06/24/2006   vaporization of endometriosis    MEDS:   Current Outpatient Medications on File Prior to Visit  Medication Sig Dispense Refill  . Cholecalciferol (D3 VITAMIN PO) 1,000 Units.     Marland Kitchen estradiol (ESTRACE) 0.1 MG/GM vaginal cream Place 1gm pv twice weekly for 4-6 weeks 42.5 g 0  . famotidine (PEPCID) 20 MG tablet Take by mouth.    . fluticasone (FLONASE) 50 MCG/ACT nasal spray Place 2 sprays into both nostrils daily. 16 g 0  . ibuprofen (ADVIL,MOTRIN) 200 MG tablet Take 800 mg by mouth as needed for headache or moderate pain.     . Lactobacillus (PROBIOTIC ACIDOPHILUS PO) Take 1 capsule by mouth daily.     Marland Kitchen lamoTRIgine (LAMICTAL) 100 MG tablet One in the morning and 1.5 in the evening (Patient taking differently: 100 mg. Takes 50 mg in the morning 50 mg at night)    . loratadine (CLARITIN) 10 MG tablet Take 10 mg by mouth daily.     . Potassium  99 MG TABS Take 99 mg by mouth daily.     . sertraline (ZOLOFT) 100 MG tablet Take 1.5 tab =150 mg daily    . vitamin E 180 MG (400 UNITS) capsule daily.    Marland Kitchen zonisamide (ZONEGRAN) 100 MG capsule Take 5 capsules nightly 450 capsule 3   No current facility-administered medications on file prior to visit.    ALLERGIES: Wellbutrin [bupropion hcl], Escitalopram oxalate, and Topiramate  Family History  Problem Relation Age of Onset  . Hypertension Mother   . Diabetes Mother   . Irritable bowel syndrome Mother   . Hypertension  Maternal Grandmother   . Diabetes Maternal Grandmother   . Other Father        bile duct cancer  . Irritable bowel syndrome Maternal Aunt     SH:  Married, non smoker  Review of Systems  Constitutional: Negative.   HENT: Negative.   Eyes: Negative.   Respiratory: Negative.   Cardiovascular: Negative.   Gastrointestinal: Negative.   Endocrine: Negative.   Genitourinary: Negative.   Musculoskeletal: Negative.   Skin:       Labial bump  Allergic/Immunologic: Negative.   Neurological: Negative.   Hematological: Negative.   Psychiatric/Behavioral: Negative.     PHYSICAL EXAMINATION:    BP 114/78   Pulse 70   Resp 16   Wt 223 lb (101.2 kg)   LMP 04/27/2016 (Approximate)   BMI 37.11 kg/m     General appearance: alert, cooperative and appears stated age Lymph:  no inguinal LAD noted  Pelvic: External genitalia:  Two small sebaceous cysts at inner edge of labia majora (one on right and one on left), also left labia minora erythematous and enlarged lesion, about 1cm, that is draining              Urethra:  normal appearing urethra with no masses, tenderness or lesions              Bartholins and Skenes: normal                  Chaperone, Cornelia Copa, CMA, was present for exam.  Assessment: Vulvar sebaceous cysts and left labia minor abscess noted today  Plan: Wound culture obtained Bactrim DS BID x 5 days sent to pharmacy

## 2020-07-18 ENCOUNTER — Telehealth: Payer: Self-pay

## 2020-07-18 DIAGNOSIS — M530 Cervicocranial syndrome: Secondary | ICD-10-CM | POA: Diagnosis not present

## 2020-07-18 DIAGNOSIS — M9902 Segmental and somatic dysfunction of thoracic region: Secondary | ICD-10-CM | POA: Diagnosis not present

## 2020-07-18 DIAGNOSIS — M9903 Segmental and somatic dysfunction of lumbar region: Secondary | ICD-10-CM | POA: Diagnosis not present

## 2020-07-18 DIAGNOSIS — M9901 Segmental and somatic dysfunction of cervical region: Secondary | ICD-10-CM | POA: Diagnosis not present

## 2020-07-18 NOTE — Telephone Encounter (Signed)
-----   Message from Jerene Bears, MD sent at 07/18/2020  6:36 AM EDT ----- Please let pt know her skin culture is growing a gram negative rod.  This will most likely be e coli.  She should complete the antibiotics.  We will give an update once this result is finalized.  Can you see if the area is any better?  Thanks.

## 2020-07-18 NOTE — Telephone Encounter (Signed)
Left message for call back.

## 2020-07-19 DIAGNOSIS — M9903 Segmental and somatic dysfunction of lumbar region: Secondary | ICD-10-CM | POA: Diagnosis not present

## 2020-07-19 DIAGNOSIS — M530 Cervicocranial syndrome: Secondary | ICD-10-CM | POA: Diagnosis not present

## 2020-07-19 DIAGNOSIS — M9901 Segmental and somatic dysfunction of cervical region: Secondary | ICD-10-CM | POA: Diagnosis not present

## 2020-07-19 DIAGNOSIS — M9902 Segmental and somatic dysfunction of thoracic region: Secondary | ICD-10-CM | POA: Diagnosis not present

## 2020-07-19 NOTE — Telephone Encounter (Signed)
Patient notified of results. See lab 

## 2020-07-20 DIAGNOSIS — M9903 Segmental and somatic dysfunction of lumbar region: Secondary | ICD-10-CM | POA: Diagnosis not present

## 2020-07-20 DIAGNOSIS — M530 Cervicocranial syndrome: Secondary | ICD-10-CM | POA: Diagnosis not present

## 2020-07-20 DIAGNOSIS — M9902 Segmental and somatic dysfunction of thoracic region: Secondary | ICD-10-CM | POA: Diagnosis not present

## 2020-07-20 DIAGNOSIS — M9901 Segmental and somatic dysfunction of cervical region: Secondary | ICD-10-CM | POA: Diagnosis not present

## 2020-07-21 DIAGNOSIS — F321 Major depressive disorder, single episode, moderate: Secondary | ICD-10-CM | POA: Diagnosis not present

## 2020-07-21 DIAGNOSIS — F410 Panic disorder [episodic paroxysmal anxiety] without agoraphobia: Secondary | ICD-10-CM | POA: Diagnosis not present

## 2020-07-21 LAB — WOUND CULTURE

## 2020-07-22 ENCOUNTER — Telehealth: Payer: Self-pay

## 2020-07-22 NOTE — Telephone Encounter (Signed)
-----   Message from Jerene Bears, MD sent at 07/22/2020 12:58 PM EDT ----- Please let pt know her culture showed e coli.  This is a common bacterial seen in vulvar infections.  She should be done with her antibiotics so I'd like to know how it looks now.  Thanks.

## 2020-07-22 NOTE — Telephone Encounter (Signed)
Left message for call back.

## 2020-07-25 DIAGNOSIS — M9902 Segmental and somatic dysfunction of thoracic region: Secondary | ICD-10-CM | POA: Diagnosis not present

## 2020-07-25 DIAGNOSIS — M9903 Segmental and somatic dysfunction of lumbar region: Secondary | ICD-10-CM | POA: Diagnosis not present

## 2020-07-25 DIAGNOSIS — M530 Cervicocranial syndrome: Secondary | ICD-10-CM | POA: Diagnosis not present

## 2020-07-25 DIAGNOSIS — M9901 Segmental and somatic dysfunction of cervical region: Secondary | ICD-10-CM | POA: Diagnosis not present

## 2020-07-25 NOTE — Telephone Encounter (Signed)
Patient notified of results. See lab 

## 2020-07-28 DIAGNOSIS — F321 Major depressive disorder, single episode, moderate: Secondary | ICD-10-CM | POA: Diagnosis not present

## 2020-07-28 DIAGNOSIS — F410 Panic disorder [episodic paroxysmal anxiety] without agoraphobia: Secondary | ICD-10-CM | POA: Diagnosis not present

## 2020-08-01 DIAGNOSIS — M9901 Segmental and somatic dysfunction of cervical region: Secondary | ICD-10-CM | POA: Diagnosis not present

## 2020-08-01 DIAGNOSIS — M9902 Segmental and somatic dysfunction of thoracic region: Secondary | ICD-10-CM | POA: Diagnosis not present

## 2020-08-01 DIAGNOSIS — M530 Cervicocranial syndrome: Secondary | ICD-10-CM | POA: Diagnosis not present

## 2020-08-01 DIAGNOSIS — M9903 Segmental and somatic dysfunction of lumbar region: Secondary | ICD-10-CM | POA: Diagnosis not present

## 2020-08-02 DIAGNOSIS — M9903 Segmental and somatic dysfunction of lumbar region: Secondary | ICD-10-CM | POA: Diagnosis not present

## 2020-08-02 DIAGNOSIS — M9901 Segmental and somatic dysfunction of cervical region: Secondary | ICD-10-CM | POA: Diagnosis not present

## 2020-08-02 DIAGNOSIS — M9902 Segmental and somatic dysfunction of thoracic region: Secondary | ICD-10-CM | POA: Diagnosis not present

## 2020-08-02 DIAGNOSIS — M530 Cervicocranial syndrome: Secondary | ICD-10-CM | POA: Diagnosis not present

## 2020-08-04 DIAGNOSIS — F410 Panic disorder [episodic paroxysmal anxiety] without agoraphobia: Secondary | ICD-10-CM | POA: Diagnosis not present

## 2020-08-04 DIAGNOSIS — F321 Major depressive disorder, single episode, moderate: Secondary | ICD-10-CM | POA: Diagnosis not present

## 2020-08-08 ENCOUNTER — Ambulatory Visit (INDEPENDENT_AMBULATORY_CARE_PROVIDER_SITE_OTHER): Payer: BC Managed Care – PPO | Admitting: Neurology

## 2020-08-08 ENCOUNTER — Other Ambulatory Visit: Payer: Self-pay

## 2020-08-08 ENCOUNTER — Encounter: Payer: Self-pay | Admitting: Neurology

## 2020-08-08 VITALS — BP 106/73 | HR 72 | Ht 66.0 in | Wt 229.8 lb

## 2020-08-08 DIAGNOSIS — G40319 Generalized idiopathic epilepsy and epileptic syndromes, intractable, without status epilepticus: Secondary | ICD-10-CM | POA: Diagnosis not present

## 2020-08-08 DIAGNOSIS — M9901 Segmental and somatic dysfunction of cervical region: Secondary | ICD-10-CM | POA: Diagnosis not present

## 2020-08-08 DIAGNOSIS — M9902 Segmental and somatic dysfunction of thoracic region: Secondary | ICD-10-CM | POA: Diagnosis not present

## 2020-08-08 DIAGNOSIS — M9903 Segmental and somatic dysfunction of lumbar region: Secondary | ICD-10-CM | POA: Diagnosis not present

## 2020-08-08 DIAGNOSIS — M530 Cervicocranial syndrome: Secondary | ICD-10-CM | POA: Diagnosis not present

## 2020-08-08 NOTE — Progress Notes (Signed)
NEUROLOGY FOLLOW UP OFFICE NOTE  April Campbell [id number] 21-Jan-1976  HISTORY OF PRESENT ILLNESS: I had the pleasure of seeing April Campbell in follow-up in the neurology clinic on 08/08/2020.  The patient was last seen 4 months ago for primary generalized epilepsy. She was having recurrent episodes of a feeling of warmth, everything around going faster, deja vu. She had inpatient EMU admission in February 2021 where Lamotrigine was stopped. She felt very anxious off Lamotrigine, much improved back on 50mg  BID however she is still having panic attacks. She is also on Zonisamide 500mg  qhs. She is unsure if the panic attacks are seizure-related, she has had 4 in the past 2 months, most last 5-6 minutes however one lasted 30 minutes. She is able to function during them, but feels panicked inside. She denies any staring/unresponsive episodes. No clear triggers, but they may be work-related when she is worrying about that day. They usually occur early in the morning. She has not had the episodes of wam feeling and things going faster, however the panic attacks are very reminiscent of them, but not the same. She has occasional headaches, no dizziness, diplopia, focal numbness/tingling/weakness, no falls. Sleep is good.    History on Initial Assessment 03/25/2020: This is a 44 year old right-handed woman with a history of anxiety, depression, migraines, presenting for a second opinion regarding epilepsy. [facility]. [facility] months ago. She had a GTC in 2002 while on Wellbutrin, EEG at that time showed bifrontal spikes in drowsiness. She was started on Depakote but had side effects. [facility] for a second opinion and had a normal MRI and EEG, tapered off Depakote. Seizures recurred in 2010 and she was started on Levetiracetam, however it was ineffective and she was changed to Zonisamide and Lamotrigine. She had a breakthrough seizure due to medication  refill mixup in 2013 and was seizure-free until 2019 when trial of Zonisamide taper was done. She started having recurrent episodes of feeling like a seizure was coming on with a feeling of warmth and everything around her going faster. She also reported deja vu feelings. She was reporting cognitive and word-finding issues, she felt she was overmedicated and had an EMU admission in February 2021 where Lamotrigine and Zonisamide were held. Typical events were not captured, however baseline EEG showed generalized 3 Hz polyspike and wave interictal activity. She was told that there was no need for LTG, on her follow-up in March 2021, she reported feeling very anxious, especially about not being tapered off Lamotrigine and poor communication about restarting it. She felt a "lack of support" for her symptoms. Lamotrigine was restarted by her PCP to 100mg  in AM, 150mg  in PM, but now she is on 50mg  BID. She continues on Zonisamide 500mg  qhs with no side effects. She continues to report word-finding difficulties. She is feeling a lot better with reduction in Lamotrigine dose and follows up with Psychiatry. She denies any significant falls.  She states she has had 3 or 4 GTCs in her lifetime. She denies losing consciousness with most of her seizures, she describes sensations of high energy inside but everything around is moving in slow motion, "like I can see the future." Sometimes there is a buzzing, strong, heat, strong fear, there is always an intense sensation that started in her childhood. In HS this was attributed to caffeine, then she had the first GTC in her early 2620s attributed to Wellbutrin. Her  last GTC was in 2017. Seizure triggers include lack of sleep/food, stress. She reports that seizures had quieted down prior to this year with a lot going on. This past months she feels she is doing better, previously she was having smaller seizures once a week, lasting for a minute. She feels tired and shaken up after, no  focal weakness. They deny any staring/unresponsive episodes, she can talk and respond. Her husband reports she twitches a lot in her sleep. She has migraines once a week and takes prn Advil and Cambia. There is occasional photophobia, no nausea/vomiting. She denies any dizziness, diplopia, dysarthria/dysphagia, bladder dysfunction. She has neck pain, occasional tingling in both arms. She has constipation.   Prior AEDs: Topamax, Depakote, Keppra  Diagnostic Data: EMU admission Feb 1-4, 2021 at [facility]: Typical events were not captured. Baseline showed generalized 3 Hz PSW interictal activity. MRI brain in 2017 without contrast was normal.   PAST MEDICAL HISTORY: Past Medical History:  Diagnosis Date  . Anxiety   . Depression   . Endometriosis   . GERD (gastroesophageal reflux disease)   . History of migraine headaches   . Seizures (HCC)    controlled with meds, last seizure 4-5 yrs ago    MEDICATIONS: Current Outpatient Medications on File Prior to Visit  Medication Sig Dispense Refill  . Cholecalciferol (D3 VITAMIN PO) 1,000 Units.     Marland Kitchen estradiol (ESTRACE) 0.1 MG/GM vaginal cream Place 1gm pv twice weekly for 4-6 weeks 42.5 g 0  . famotidine (PEPCID) 20 MG tablet Take by mouth.    . fluticasone (FLONASE) 50 MCG/ACT nasal spray Place 2 sprays into both nostrils daily. 16 g 0  . ibuprofen (ADVIL,MOTRIN) 200 MG tablet Take 800 mg by mouth as needed for headache or moderate pain.     . Lactobacillus (PROBIOTIC ACIDOPHILUS PO) Take 1 capsule by mouth daily.     Marland Kitchen lamoTRIgine (LAMICTAL) 100 MG tablet One in the morning and 1.5 in the evening (Patient taking differently: 100 mg. Takes 50 mg in the morning 50 mg at night)    . loratadine (CLARITIN) 10 MG tablet Take 10 mg by mouth daily.     . Potassium 99 MG TABS Take 99 mg by mouth daily.     . sertraline (ZOLOFT) 100 MG tablet Take 1.5 tab =150 mg daily    . vitamin E 180 MG (400 UNITS) capsule daily.    Marland Kitchen zonisamide (ZONEGRAN) 100 MG  capsule Take 5 capsules nightly 450 capsule 3   No current facility-administered medications on file prior to visit.    ALLERGIES: Allergies  Allergen Reactions  . Wellbutrin [Bupropion Hcl] Other (See Comments)    SEIZURE  . Escitalopram Oxalate Other (See Comments)    Decreased sex drive  . Topiramate Other (See Comments)    foggy    FAMILY HISTORY: Family History  Problem Relation Age of Onset  . Hypertension Mother   . Diabetes Mother   . Irritable bowel syndrome Mother   . Hypertension Maternal Grandmother   . Diabetes Maternal Grandmother   . Other Father        bile duct cancer  . Irritable bowel syndrome Maternal Aunt     SOCIAL HISTORY: Social History   Socioeconomic History  . Marital status: Married    Spouse name: Not on file  . Number of children: 0  . Years of education: Not on file  . Highest education level: Not on file  Occupational History  . Occupation: regulatory  specialist in [employer]  Tobacco Use  . Smoking status: Never Smoker  . Smokeless tobacco: Never Used  Vaping Use  . Vaping Use: Never used  Substance and Sexual Activity  . Alcohol use: Yes    Alcohol/week: 2.0 standard drinks    Types: 2 Standard drinks or equivalent per week  . Drug use: No  . Sexual activity: Yes    Partners: Male    Birth control/protection: Surgical    Comment: husband vasectomy/ Hysterectomy  Other Topics Concern  . Not on file  Social History Narrative   Right handed    Lives with husband    Social Determinants of Health   Financial Resource Strain:   . Difficulty of Paying Living Expenses: Not on file  Food Insecurity:   . Worried About Programme researcher, broadcasting/film/video in the Last Year: Not on file  . Ran Out of Food in the Last Year: Not on file  Transportation Needs:   . Lack of Transportation (Medical): Not on file  . Lack of Transportation (Non-Medical): Not on file  Physical Activity:   . Days of Exercise per Week: Not on file  . Minutes of  Exercise per Session: Not on file  Stress:   . Feeling of Stress : Not on file  Social Connections:   . Frequency of Communication with Friends and Family: Not on file  . Frequency of Social Gatherings with Friends and Family: Not on file  . Attends Religious Services: Not on file  . Active Member of Clubs or Organizations: Not on file  . Attends Banker Meetings: Not on file  . Marital Status: Not on file  Intimate Partner Violence:   . Fear of Current or Ex-Partner: Not on file  . Emotionally Abused: Not on file  . Physically Abused: Not on file  . Sexually Abused: Not on file     PHYSICAL EXAM: Vitals:   08/08/20 1345  BP: 106/73  Pulse: 72  SpO2: 99%   General: No acute distress Head:  Normocephalic/atraumatic Skin/Extremities: No rash, no edema Neurological Exam: alert and awake.  No aphasia or dysarthria. Fund of knowledge is appropriate.  Recent and remote memory are intact.  Attention and concentration are normal.   Cranial nerves: Pupils equal, round. Extraocular movements intact with no nystagmus. Visual fields full.  No facial asymmetry.  Motor: Bulk and tone normal, muscle strength 5/5 throughout with no pronator drift.   Finger to nose testing intact.  Gait narrow-based and steady, able to tandem walk adequately.  Romberg negative.   IMPRESSION: This is a 44 yo RH woman with primary generalized epilepsy. EEG showed 3 Hz polyspike and wave discharges, MRI brain normal. She had significant worsening of depression and anxiety with sudden discontinuation of Lamotrigine during EMU admission in February, which improved back on Lamotrigine 50mg  BID. She is also on Zonisamide 500mg  qhs. Last GTC was in 2017, she denies any of her typical seizures but reports continued panic attacks reminiscent of milder seizures. We discussed consideration for increasing Lamotrigine to 100mg  BID and assess if this helps with these episodes, she will continue symptom calendar and let  know if she would like to proceed. Continue follow-up with Psychiatry. She is aware of [place] driving laws to stop driving after a seizure until 6 months seizure-free. Follow-up in 4-5 months, she knows to call for any changes.    Thank you for allowing me to participate in her care.  Please do not hesitate to  call for any questions or concerns.   Patrcia Dolly, M.D.   CC: Dr. Barbaraann Barthel

## 2020-08-08 NOTE — Patient Instructions (Signed)
Good to see you. Consider increasing Lamictal to 100mg  twice a day. Continue symptom calendar, call our office if you would like to proceed with increased dose. Continue Zonisamide 500mg  every night. Follow-up in 4-5 months, call for any changes.   Seizure Precautions: 1. If medication has been prescribed for you to prevent seizures, take it exactly as directed.  Do not stop taking the medicine without talking to your doctor first, even if you have not had a seizure in a long time.   2. Avoid activities in which a seizure would cause danger to yourself or to others.  Don't operate dangerous machinery, swim alone, or climb in high or dangerous places, such as on ladders, roofs, or girders.  Do not drive unless your doctor says you may.  3. If you have any warning that you may have a seizure, lay down in a safe place where you can't hurt yourself.    4.  No driving for 6 months from last seizure, as per [facility].   Please refer to the following link on the Epilepsy Foundation of America's website for more information: http://www.epilepsyfoundation.org/answerplace/Social/driving/drivingu.cfm   5.  Maintain good sleep hygiene. Avoid alcohol  6.  Notify your neurology if you are planning pregnancy or if you become pregnant.  7.  Contact your doctor if you have any problems that may be related to the medicine you are taking.  8.  Call 911 and bring the patient back to the ED if:        A.  The seizure lasts longer than 5 minutes.       B.  The patient doesn't awaken shortly after the seizure  C.  The patient has new problems such as difficulty seeing, speaking or moving  D.  The patient was injured during the seizure  E.  The patient has a temperature over 102 F (39C)  F.  The patient vomited and now is having trouble breathing

## 2020-08-10 ENCOUNTER — Ambulatory Visit: Payer: BC Managed Care – PPO | Admitting: Neurology

## 2020-08-11 DIAGNOSIS — F321 Major depressive disorder, single episode, moderate: Secondary | ICD-10-CM | POA: Diagnosis not present

## 2020-08-11 DIAGNOSIS — F410 Panic disorder [episodic paroxysmal anxiety] without agoraphobia: Secondary | ICD-10-CM | POA: Diagnosis not present

## 2020-08-15 DIAGNOSIS — M9903 Segmental and somatic dysfunction of lumbar region: Secondary | ICD-10-CM | POA: Diagnosis not present

## 2020-08-15 DIAGNOSIS — M9902 Segmental and somatic dysfunction of thoracic region: Secondary | ICD-10-CM | POA: Diagnosis not present

## 2020-08-15 DIAGNOSIS — M9901 Segmental and somatic dysfunction of cervical region: Secondary | ICD-10-CM | POA: Diagnosis not present

## 2020-08-15 DIAGNOSIS — M530 Cervicocranial syndrome: Secondary | ICD-10-CM | POA: Diagnosis not present

## 2020-08-18 DIAGNOSIS — F321 Major depressive disorder, single episode, moderate: Secondary | ICD-10-CM | POA: Diagnosis not present

## 2020-08-18 DIAGNOSIS — F410 Panic disorder [episodic paroxysmal anxiety] without agoraphobia: Secondary | ICD-10-CM | POA: Diagnosis not present

## 2020-08-22 DIAGNOSIS — M9902 Segmental and somatic dysfunction of thoracic region: Secondary | ICD-10-CM | POA: Diagnosis not present

## 2020-08-22 DIAGNOSIS — M9903 Segmental and somatic dysfunction of lumbar region: Secondary | ICD-10-CM | POA: Diagnosis not present

## 2020-08-22 DIAGNOSIS — M530 Cervicocranial syndrome: Secondary | ICD-10-CM | POA: Diagnosis not present

## 2020-08-22 DIAGNOSIS — M9901 Segmental and somatic dysfunction of cervical region: Secondary | ICD-10-CM | POA: Diagnosis not present

## 2020-08-24 DIAGNOSIS — M530 Cervicocranial syndrome: Secondary | ICD-10-CM | POA: Diagnosis not present

## 2020-08-24 DIAGNOSIS — M9903 Segmental and somatic dysfunction of lumbar region: Secondary | ICD-10-CM | POA: Diagnosis not present

## 2020-08-24 DIAGNOSIS — M9901 Segmental and somatic dysfunction of cervical region: Secondary | ICD-10-CM | POA: Diagnosis not present

## 2020-08-24 DIAGNOSIS — M9902 Segmental and somatic dysfunction of thoracic region: Secondary | ICD-10-CM | POA: Diagnosis not present

## 2020-08-25 DIAGNOSIS — F410 Panic disorder [episodic paroxysmal anxiety] without agoraphobia: Secondary | ICD-10-CM | POA: Diagnosis not present

## 2020-08-25 DIAGNOSIS — F321 Major depressive disorder, single episode, moderate: Secondary | ICD-10-CM | POA: Diagnosis not present

## 2020-08-29 DIAGNOSIS — M9901 Segmental and somatic dysfunction of cervical region: Secondary | ICD-10-CM | POA: Diagnosis not present

## 2020-08-29 DIAGNOSIS — M9902 Segmental and somatic dysfunction of thoracic region: Secondary | ICD-10-CM | POA: Diagnosis not present

## 2020-08-29 DIAGNOSIS — M530 Cervicocranial syndrome: Secondary | ICD-10-CM | POA: Diagnosis not present

## 2020-08-29 DIAGNOSIS — M9903 Segmental and somatic dysfunction of lumbar region: Secondary | ICD-10-CM | POA: Diagnosis not present

## 2020-08-31 DIAGNOSIS — M9901 Segmental and somatic dysfunction of cervical region: Secondary | ICD-10-CM | POA: Diagnosis not present

## 2020-08-31 DIAGNOSIS — M9903 Segmental and somatic dysfunction of lumbar region: Secondary | ICD-10-CM | POA: Diagnosis not present

## 2020-08-31 DIAGNOSIS — M9902 Segmental and somatic dysfunction of thoracic region: Secondary | ICD-10-CM | POA: Diagnosis not present

## 2020-08-31 DIAGNOSIS — M530 Cervicocranial syndrome: Secondary | ICD-10-CM | POA: Diagnosis not present

## 2020-09-05 DIAGNOSIS — M9902 Segmental and somatic dysfunction of thoracic region: Secondary | ICD-10-CM | POA: Diagnosis not present

## 2020-09-05 DIAGNOSIS — M9901 Segmental and somatic dysfunction of cervical region: Secondary | ICD-10-CM | POA: Diagnosis not present

## 2020-09-05 DIAGNOSIS — M9903 Segmental and somatic dysfunction of lumbar region: Secondary | ICD-10-CM | POA: Diagnosis not present

## 2020-09-05 DIAGNOSIS — M530 Cervicocranial syndrome: Secondary | ICD-10-CM | POA: Diagnosis not present

## 2020-09-07 DIAGNOSIS — M9901 Segmental and somatic dysfunction of cervical region: Secondary | ICD-10-CM | POA: Diagnosis not present

## 2020-09-07 DIAGNOSIS — M530 Cervicocranial syndrome: Secondary | ICD-10-CM | POA: Diagnosis not present

## 2020-09-07 DIAGNOSIS — M9902 Segmental and somatic dysfunction of thoracic region: Secondary | ICD-10-CM | POA: Diagnosis not present

## 2020-09-07 DIAGNOSIS — M9903 Segmental and somatic dysfunction of lumbar region: Secondary | ICD-10-CM | POA: Diagnosis not present

## 2020-09-08 DIAGNOSIS — F410 Panic disorder [episodic paroxysmal anxiety] without agoraphobia: Secondary | ICD-10-CM | POA: Diagnosis not present

## 2020-09-08 DIAGNOSIS — F321 Major depressive disorder, single episode, moderate: Secondary | ICD-10-CM | POA: Diagnosis not present

## 2020-09-15 DIAGNOSIS — F321 Major depressive disorder, single episode, moderate: Secondary | ICD-10-CM | POA: Diagnosis not present

## 2020-09-15 DIAGNOSIS — F410 Panic disorder [episodic paroxysmal anxiety] without agoraphobia: Secondary | ICD-10-CM | POA: Diagnosis not present

## 2020-09-19 DIAGNOSIS — Z79899 Other long term (current) drug therapy: Secondary | ICD-10-CM | POA: Diagnosis not present

## 2020-09-19 DIAGNOSIS — F419 Anxiety disorder, unspecified: Secondary | ICD-10-CM | POA: Diagnosis not present

## 2020-09-19 DIAGNOSIS — F32A Depression, unspecified: Secondary | ICD-10-CM | POA: Diagnosis not present

## 2020-09-21 DIAGNOSIS — F321 Major depressive disorder, single episode, moderate: Secondary | ICD-10-CM | POA: Diagnosis not present

## 2020-09-21 DIAGNOSIS — F410 Panic disorder [episodic paroxysmal anxiety] without agoraphobia: Secondary | ICD-10-CM | POA: Diagnosis not present

## 2020-09-29 DIAGNOSIS — F321 Major depressive disorder, single episode, moderate: Secondary | ICD-10-CM | POA: Diagnosis not present

## 2020-09-29 DIAGNOSIS — F410 Panic disorder [episodic paroxysmal anxiety] without agoraphobia: Secondary | ICD-10-CM | POA: Diagnosis not present

## 2020-10-03 DIAGNOSIS — F321 Major depressive disorder, single episode, moderate: Secondary | ICD-10-CM | POA: Diagnosis not present

## 2020-10-03 DIAGNOSIS — F410 Panic disorder [episodic paroxysmal anxiety] without agoraphobia: Secondary | ICD-10-CM | POA: Diagnosis not present

## 2020-10-13 DIAGNOSIS — F321 Major depressive disorder, single episode, moderate: Secondary | ICD-10-CM | POA: Diagnosis not present

## 2020-10-13 DIAGNOSIS — F410 Panic disorder [episodic paroxysmal anxiety] without agoraphobia: Secondary | ICD-10-CM | POA: Diagnosis not present

## 2020-10-20 DIAGNOSIS — F321 Major depressive disorder, single episode, moderate: Secondary | ICD-10-CM | POA: Diagnosis not present

## 2020-10-20 DIAGNOSIS — F410 Panic disorder [episodic paroxysmal anxiety] without agoraphobia: Secondary | ICD-10-CM | POA: Diagnosis not present

## 2020-10-24 ENCOUNTER — Other Ambulatory Visit: Payer: BC Managed Care – PPO

## 2020-10-24 DIAGNOSIS — Z20822 Contact with and (suspected) exposure to covid-19: Secondary | ICD-10-CM | POA: Diagnosis not present

## 2020-10-26 LAB — NOVEL CORONAVIRUS, NAA: SARS-CoV-2, NAA: NOT DETECTED

## 2020-10-26 LAB — SARS-COV-2, NAA 2 DAY TAT

## 2020-12-09 ENCOUNTER — Other Ambulatory Visit: Payer: Self-pay

## 2020-12-09 ENCOUNTER — Encounter: Payer: Self-pay | Admitting: Neurology

## 2020-12-09 ENCOUNTER — Ambulatory Visit: Payer: 59 | Admitting: Neurology

## 2020-12-09 VITALS — BP 129/89 | HR 69 | Ht 66.0 in | Wt 231.6 lb

## 2020-12-09 DIAGNOSIS — G40319 Generalized idiopathic epilepsy and epileptic syndromes, intractable, without status epilepticus: Secondary | ICD-10-CM | POA: Diagnosis not present

## 2020-12-09 MED ORDER — ZONISAMIDE 100 MG PO CAPS: ORAL_CAPSULE | ORAL | 3 refills | Status: DC

## 2020-12-09 MED ORDER — LAMOTRIGINE 100 MG PO TABS: ORAL_TABLET | ORAL | Status: AC

## 2020-12-09 NOTE — Patient Instructions (Signed)
Good to see you!  1. Continue Zonisamide 100mg : Take 5 capsules every night  2. Continue Lamotrigine 100mg : Take 1/2 tablet twice a day  3. Follow-up in 6-8 months, call for any changes.   Seizure Precautions: 1. If medication has been prescribed for you to prevent seizures, take it exactly as directed.  Do not stop taking the medicine without talking to your doctor first, even if you have not had a seizure in a long time.   2. Avoid activities in which a seizure would cause danger to yourself or to others.  Don't operate dangerous machinery, swim alone, or climb in high or dangerous places, such as on ladders, roofs, or girders.  Do not drive unless your doctor says you may.  3. If you have any warning that you may have a seizure, lay down in a safe place where you can't hurt yourself.    4.  No driving for 6 months from last seizure, as per [facility].   Please refer to the following link on the Epilepsy Foundation of America's website for more information: http://www.epilepsyfoundation.org/answerplace/Social/driving/drivingu.cfm   5.  Maintain good sleep hygiene. Avoid alcohol  6.  Notify your neurology if you are planning pregnancy or if you become pregnant.  7.  Contact your doctor if you have any problems that may be related to the medicine you are taking.  8.  Call 911 and bring the patient back to the ED if:        A.  The seizure lasts longer than 5 minutes.       B.  The patient doesn't awaken shortly after the seizure  C.  The patient has new problems such as difficulty seeing, speaking or moving  D.  The patient was injured during the seizure  E.  The patient has a temperature over 102 F (39C)  F.  The patient vomited and now is having trouble breathing

## 2020-12-09 NOTE — Progress Notes (Signed)
NEUROLOGY FOLLOW UP OFFICE NOTE  April Campbell [id number] May 12, 1976  HISTORY OF PRESENT ILLNESS: I had the pleasure of seeing April Campbell in follow-up in the neurology clinic on 12/09/2020.  The patient was last seen 4 months ago for primary generalized epilepsy. She is alone in the office today. She denies any GTCs since 2017. On her last visit, she was reporting panic attacks. We agreed to stay on same doses of Lamotrigine 100mg  1/2 tab BID and Zonisamide 500mg  qhs, and she reports today that she has not been having these episodes and feels stable on current regimen. No side effects. She denies any staring/unresponsive episodes, gaps in time, focal numbness/tingling/weakness, no falls. She has been having headaches that she attributes to weather changes, but overall has been doing pretty good. Her husband reports she moves a lot in her sleep, no myoclonic jerks in the daytime. She has been lightheaded a couple of times. She reports sleep is not the best, things have been stressful and really busy. She gets 6-7 hours of sleep but hopes stress will settle down soon. Mood is really good, better.    History on Initial Assessment 03/25/2020: This is a 45 year old right-handed woman with a history of anxiety, depression, migraines, presenting for a second opinion regarding epilepsy. Records from [facility] were reviewed. She was last seen at [facility] 2 months ago. She had a GTC in 2002 while on Wellbutrin, EEG at that time showed bifrontal spikes in drowsiness. She was started on Depakote but had side effects. She went to [facility] for a second opinion and had a normal MRI and EEG, tapered off Depakote. Seizures recurred in 2010 and she was started on Levetiracetam, however it was ineffective and she was changed to Zonisamide and Lamotrigine. She had a breakthrough seizure due to medication refill mixup in 2013 and was seizure-free until 2019 when trial of Zonisamide taper was done. She started having recurrent  episodes of feeling like a seizure was coming on with a feeling of warmth and everything around her going faster. She also reported deja vu feelings. She was reporting cognitive and word-finding issues, she felt she was overmedicated and had an EMU admission in February 2021 where Lamotrigine and Zonisamide were held. Typical events were not captured, however baseline EEG showed generalized 3 Hz polyspike and wave interictal activity. She was told that there was no need for LTG, on her follow-up in March 2021, she reported feeling very anxious, especially about not being tapered off Lamotrigine and poor communication about restarting it. She felt a "lack of support" for her symptoms. Lamotrigine was restarted by her PCP to 100mg  in AM, 150mg  in PM, but now she is on 50mg  BID. She continues on Zonisamide 500mg  qhs with no side effects. She continues to report word-finding difficulties. She is feeling a lot better with reduction in Lamotrigine dose and follows up with Psychiatry. She denies any significant falls.  She states she has had 3 or 4 GTCs in her lifetime. She denies losing consciousness with most of her seizures, she describes sensations of high energy inside but everything around is moving in slow motion, "like I can see the future." Sometimes there is a buzzing, strong, heat, strong fear, there is always an intense sensation that started in her childhood. In HS this was attributed to caffeine, then she had the first GTC in her early 70s attributed to Wellbutrin. Her last GTC was in 2017. Seizure triggers include lack of sleep/food, stress. She  reports that seizures had quieted down prior to this year with a lot going on. This past months she feels she is doing better, previously she was having smaller seizures once a week, lasting for a minute. She feels tired and shaken up after, no focal weakness. They deny any staring/unresponsive episodes, she can talk and respond. Her husband reports she twitches a  lot in her sleep. She has migraines once a week and takes prn Advil and Cambia. There is occasional photophobia, no nausea/vomiting. She denies any dizziness, diplopia, dysarthria/dysphagia, bladder dysfunction. She has neck pain, occasional tingling in both arms. She has constipation.   Prior AEDs: Topamax, Depakote, Keppra  Diagnostic Data: EMU admission Feb 1-4, 2021 at [facility]: Typical events were not captured. Baseline showed generalized 3 Hz PSW interictal activity. MRI brain in 2017 without contrast was normal.   PAST MEDICAL HISTORY: Past Medical History:  Diagnosis Date  . Anxiety   . Depression   . Endometriosis   . GERD (gastroesophageal reflux disease)   . History of migraine headaches   . Seizures (HCC)    controlled with meds, last seizure 4-5 yrs ago    MEDICATIONS: Current Outpatient Medications on File Prior to Visit  Medication Sig Dispense Refill  . Cholecalciferol (D3 VITAMIN PO) 1,000 Units.     . famotidine (PEPCID) 20 MG tablet Take by mouth.    . fluticasone (FLONASE) 50 MCG/ACT nasal spray Place 2 sprays into both nostrils daily. 16 g 0  . ibuprofen (ADVIL,MOTRIN) 200 MG tablet Take 800 mg by mouth as needed for headache or moderate pain.     . Lactobacillus (PROBIOTIC ACIDOPHILUS PO) Take 1 capsule by mouth daily.     Marland Kitchen lamoTRIgine (LAMICTAL) 100 MG tablet One in the morning and 1.5 in the evening (Patient taking differently: 100 mg. Takes 50 mg in the morning 50 mg at night)    . loratadine (CLARITIN) 10 MG tablet Take 10 mg by mouth daily.    . Potassium 99 MG TABS Take 99 mg by mouth daily.     . sertraline (ZOLOFT) 100 MG tablet Take 1.5 tab =150 mg daily    . vitamin E 180 MG (400 UNITS) capsule daily.    Marland Kitchen zonisamide (ZONEGRAN) 100 MG capsule Take 5 capsules nightly 450 capsule 3  . estradiol (ESTRACE) 0.1 MG/GM vaginal cream Place 1gm pv twice weekly for 4-6 weeks (Patient not taking: Reported on 12/09/2020) 42.5 g 0   No current facility-administered  medications on file prior to visit.    ALLERGIES: Allergies  Allergen Reactions  . Wellbutrin [Bupropion Hcl] Other (See Comments)    SEIZURE  . Escitalopram Oxalate Other (See Comments)    Decreased sex drive  . Topiramate Other (See Comments)    foggy    FAMILY HISTORY: Family History  Problem Relation Age of Onset  . Hypertension Mother   . Diabetes Mother   . Irritable bowel syndrome Mother   . Hypertension Maternal Grandmother   . Diabetes Maternal Grandmother   . Other Father        bile duct cancer  . Irritable bowel syndrome Maternal Aunt     SOCIAL HISTORY: Social History   Socioeconomic History  . Marital status: Married    Spouse name: Not on file  . Number of children: 0  . Years of education: Not on file  . Highest education level: Not on file  Occupational History  . Occupation: Film/video editor in [employer]  Tobacco Use  .  Smoking status: Never Smoker  . Smokeless tobacco: Never Used  Vaping Use  . Vaping Use: Never used  Substance and Sexual Activity  . Alcohol use: Yes    Alcohol/week: 2.0 standard drinks    Types: 2 Standard drinks or equivalent per week  . Drug use: No  . Sexual activity: Yes    Partners: Male    Birth control/protection: Surgical    Comment: husband vasectomy/ Hysterectomy  Other Topics Concern  . Not on file  Social History Narrative   Right handed    Lives with husband    Social Determinants of Health   Financial Resource Strain: Not on file  Food Insecurity: Not on file  Transportation Needs: Not on file  Physical Activity: Not on file  Stress: Not on file  Social Connections: Not on file  Intimate Partner Violence: Not on file     PHYSICAL EXAM: Vitals:   12/09/20 1124  BP: 129/89  Pulse: 69  SpO2: 99%   General: No acute distress Head:  Normocephalic/atraumatic Skin/Extremities: No rash, no edema Neurological Exam: alert and awake. No aphasia or dysarthria. Fund of knowledge is  appropriate.  Attention and concentration are normal.   Cranial nerves: Pupils equal, round. Extraocular movements intact with no nystagmus. Visual fields full.  No facial asymmetry.  Motor: Bulk and tone normal, muscle strength 5/5 throughout with no pronator drift.   Finger to nose testing intact.  Gait narrow-based and steady, able to tandem walk adequately.  Romberg negative.   IMPRESSION: This is a 45 yo RH woman with primary generalized epilepsy. EEG showed 3 Hz polyspike and wave discharges, MRI brain normal. She had significant worsening of depression and anxiety with sudden discontinuation of Lamotrigine during EMU admission, she is doing well and feels stable on current regimen of Lamotrigine 100mg  1/2 tab BID (50mg  BID) and Zonisamide 500mg  qhs. No GTCs since 2017, no further panic attacks. Continue follow-up with Behavioral Health. She is aware of [place] driving laws to stop driving after a seizure until 6 months seizure-free. Follow-up in 6-8 months, she knows to call for any changes.    Thank you for allowing me to participate in her care.  Please do not hesitate to call for any questions or concerns.   , M.D.   CC: Dr. 

## 2021-02-21 ENCOUNTER — Ambulatory Visit: Payer: BC Managed Care – PPO

## 2021-03-03 ENCOUNTER — Other Ambulatory Visit: Payer: Self-pay

## 2021-03-03 ENCOUNTER — Emergency Department (HOSPITAL_COMMUNITY)
Admission: EM | Admit: 2021-03-03 | Discharge: 2021-03-04 | Disposition: A | Discharge status: Home / Self Care | Payer: 59 | Attending: Emergency Medicine | Admitting: Emergency Medicine

## 2021-03-03 ENCOUNTER — Emergency Department (HOSPITAL_COMMUNITY): Payer: 59

## 2021-03-03 ENCOUNTER — Encounter (HOSPITAL_COMMUNITY): Payer: Self-pay | Admitting: Emergency Medicine

## 2021-03-03 DIAGNOSIS — U071 COVID-19: Secondary | ICD-10-CM | POA: Insufficient documentation

## 2021-03-03 DIAGNOSIS — M79601 Pain in right arm: Secondary | ICD-10-CM | POA: Diagnosis not present

## 2021-03-03 DIAGNOSIS — M79602 Pain in left arm: Secondary | ICD-10-CM | POA: Insufficient documentation

## 2021-03-03 DIAGNOSIS — R0602 Shortness of breath: Secondary | ICD-10-CM | POA: Diagnosis present

## 2021-03-03 LAB — CBC WITH DIFFERENTIAL/PLATELET
Abs Immature Granulocytes: 0.22 10*3/uL — ABNORMAL HIGH (ref 0.00–0.07)
Basophils Absolute: 0.1 10*3/uL (ref 0.0–0.1)
Basophils Relative: 1 %
Eosinophils Absolute: 0.1 10*3/uL (ref 0.0–0.5)
Eosinophils Relative: 1 %
HCT: 43.4 % (ref 36.0–46.0)
Hemoglobin: 14.4 g/dL (ref 12.0–15.0)
Immature Granulocytes: 2 %
Lymphocytes Relative: 25 %
Lymphs Abs: 3.2 10*3/uL (ref 0.7–4.0)
MCH: 30.3 pg (ref 26.0–34.0)
MCHC: 33.2 g/dL (ref 30.0–36.0)
MCV: 91.2 fL (ref 80.0–100.0)
Monocytes Absolute: 0.8 10*3/uL (ref 0.1–1.0)
Monocytes Relative: 6 %
Neutro Abs: 8.5 10*3/uL — ABNORMAL HIGH (ref 1.7–7.7)
Neutrophils Relative %: 65 %
Platelets: 404 10*3/uL — ABNORMAL HIGH (ref 150–400)
RBC: 4.76 MIL/uL (ref 3.87–5.11)
RDW: 12.9 % (ref 11.5–15.5)
WBC: 12.8 10*3/uL — ABNORMAL HIGH (ref 4.0–10.5)
nRBC: 0 % (ref 0.0–0.2)

## 2021-03-03 LAB — BASIC METABOLIC PANEL
Anion gap: 10 (ref 5–15)
BUN: 15 mg/dL (ref 6–20)
CO2: 27 mmol/L (ref 22–32)
Calcium: 8.9 mg/dL (ref 8.9–10.3)
Chloride: 101 mmol/L (ref 98–111)
Creatinine, Ser: 0.7 mg/dL (ref 0.44–1.00)
GFR, Estimated: >60 mL/min (ref >60)
Glucose, Bld: 148 mg/dL — ABNORMAL HIGH (ref 70–99)
Potassium: 3.2 mmol/L — ABNORMAL LOW (ref 3.5–5.1)
Sodium: 138 mmol/L (ref 135–145)

## 2021-03-03 LAB — TROPONIN I (HIGH SENSITIVITY)
Troponin I (High Sensitivity): 3 ng/L (ref <18)
Troponin I (High Sensitivity): 3 ng/L (ref <18)

## 2021-03-03 LAB — D-DIMER, QUANTITATIVE: D-Dimer, Quant: 0.31 ug/mL-FEU (ref 0.00–0.50)

## 2021-03-03 NOTE — ED Triage Notes (Signed)
Pt here with c/o cp and sob , pt had covid 10 days ago and thinks that it may be related to that

## 2021-03-03 NOTE — ED Provider Notes (Signed)
Emergency Medicine Provider Triage Evaluation Note  April Campbell , a 45 y.o. female  was evaluated in triage.  Pt complains of chest pain and shortness of breath.  Patient diagnosed with COVID 10 days ago and has had a rough course, symptoms started to get better but then she started having worsening chest pain and shortness of breath over the past 2 hours.  No prior history of blood clots, not on any blood thinners.  Still having some cough but no fever.  Review of Systems  Positive: Chest pain, shortness of breath, cough Negative: fever  Physical Exam  BP (!) 162/91 (BP Location: Left Arm)   Pulse (!) 109   Temp 98.3 F (36.8 C) (Oral)   Resp 18   LMP 04/27/2016 (Approximate)   SpO2 98%  Gen:   Awake, no distress  Resp:  Normal effort, CTA bilat MSK:   Moves extremities without difficulty   Medical Decision Making  Medically screening exam initiated at 3:59 PM.  Appropriate orders placed.  April Campbell was informed that the remainder of the evaluation will be completed by another provider, this initial triage assessment does not replace that evaluation, and the importance of remaining in the ED until their evaluation is complete.     Dartha Lodge, PA-C 03/03/21 1604    Benjiman Core, MD 03/03/21 1606

## 2021-03-04 MED ORDER — IPRATROPIUM-ALBUTEROL 0.5-2.5 (3) MG/3ML IN SOLN
3.0000 mL | Freq: Once | RESPIRATORY_TRACT | Status: AC
  Administered 2021-03-04: 3 mL via RESPIRATORY_TRACT
  Filled 2021-03-04: qty 3

## 2021-03-04 NOTE — ED Provider Notes (Signed)
[facility] EMERGENCY DEPARTMENT Provider Note   CSN: [id number] Arrival date & time: 03/03/21  1514     History No chief complaint on file.   April Campbell is a 45 y.o. female with a history of epilepsy, depression, and endometriosis who presents the emergency department with a chief complaint of shortness of breath.  The patient tested positive for COVID-19 on 4/27 after developing symptoms on 4/24.  She has been having chest pain and shortness of breath throughout her illness course, but reports that yesterday the shortness of breath and chest pain significantly worsened after symptoms had seem to be improving symptoms were constant, but would wax and wane in intensity.  She characterizes the chest pain as burning.  The pain was present in her bilateral chest and radiated up to her bilateral upper arms.  Symptoms were not noted to be worse at rest or with exertion.  Pain is not pleuritic or postprandial.  She denies syncope, dizziness, lightheadedness, vomiting, diarrhea, rash, back pain, visual changes, numbness, or weakness.  She also notes that she has had increasing brain fogginess since yesterday, which is new.  No headaches, confusion.  She was treated with antiviral medication and a 6-day course of prednisone, which she has completed.  She also has been given an albuterol inhaler, which she has used at home, but has not had increased usage.  She has had no breakthrough seizures.  She does not take OCPs.  No recent long travel.  No history of VTE.  The history is provided by the patient and medical records. No language interpreter was used.       Past Medical History:  Diagnosis Date  . Anxiety   . Depression   . Endometriosis   . GERD (gastroesophageal reflux disease)   . History of migraine headaches   . Seizures (HCC)    controlled with meds, last seizure 4-5 yrs ago    Patient Active Problem List   Diagnosis Date Noted  . Anxiety 12/14/2016  .  Endometriosis 04/25/2016  . PCOS (polycystic ovarian syndrome) 03/19/2016  . Epilepsy with partial complex seizures (HCC) 01/21/2016  . Idiopathic generalized epilepsy (HCC) 12/06/2015  . Migraine without aura and responsive to treatment 12/06/2015    Past Surgical History:  Procedure Laterality Date  . CARPAL TUNNEL RELEASE Right 06/14/2015   Procedure: RIGHT CARPAL TUNNEL RELEASE ENDOSCOPIC;  Surgeon: Mack Hook, MD;  Location: [facility];  Service: Orthopedics;  Laterality: Right;  . COLONOSCOPY WITH PROPOFOL N/A 02/10/2013   Procedure: COLONOSCOPY WITH PROPOFOL;  Surgeon: Charolett Bumpers, MD;  Location: [facility];  Service: Endoscopy;  Laterality: N/A;  . CYSTOSCOPY N/A 05/14/2016   Procedure: CYSTOSCOPY;  Surgeon: Jerene Bears, MD;  Location: [facility];  Service: Gynecology;  Laterality: N/A;  . DIAGNOSTIC LAPAROSCOPY  12/15/2009   uterosacral nerve ablation  . ELBOW SURGERY Left 2012  . LAPAROSCOPIC BILATERAL SALPINGECTOMY Bilateral 05/14/2016   Procedure: LAPAROSCOPIC BILATERAL SALPINGECTOMY;  Surgeon: Jerene Bears, MD;  Location: [facility];  Service: Gynecology;  Laterality: Bilateral;  . LAPAROSCOPIC HYSTERECTOMY N/A 05/14/2016   Procedure: HYSTERECTOMY TOTAL LAPAROSCOPIC;  Surgeon: Jerene Bears, MD;  Location: [facility];  Service: Gynecology;  Laterality: N/A;  . UMBILICAL HERNIA REPAIR     as an infant  . WISDOM TOOTH EXTRACTION    . YAG LASER APPLICATION  06/24/2006   vaporization of endometriosis     OB History    Gravida  0   Para  0   Term  0   Preterm  0   AB  0   Living  0     SAB  0   IAB  0   Ectopic  0   Multiple  0   Live Births              Family History  Problem Relation Age of Onset  . Hypertension Mother   . Diabetes Mother   . Irritable bowel syndrome Mother   . Hypertension Maternal Grandmother   . Diabetes Maternal Grandmother   . Other Father        bile duct cancer  . Irritable bowel syndrome Maternal Aunt      Social History   Tobacco Use  . Smoking status: Never Smoker  . Smokeless tobacco: Never Used  Vaping Use  . Vaping Use: Never used  Substance Use Topics  . Alcohol use: Yes    Alcohol/week: 2.0 standard drinks    Types: 2 Standard drinks or equivalent per week  . Drug use: No    Home Medications Prior to Admission medications   Medication Sig Start Date End Date Taking? Authorizing Provider  albuterol (VENTOLIN HFA) 108 (90 Base) MCG/ACT inhaler Inhale 2 puffs into the lungs every 4 (four) hours as needed for wheezing or shortness of breath. 02/23/21  Yes [provider]  famotidine (PEPCID) 20 MG tablet Take 20 mg by mouth at bedtime.   Yes [provider]  fluticasone (FLONASE) 50 MCG/ACT nasal spray Place 2 sprays into both nostrils daily. 04/06/20  Yes Pricilla LovelessGoldston, Scott, MD  hydrOXYzine (ATARAX/VISTARIL) 10 MG tablet Take 10 mg by mouth 3 (three) times daily as needed for anxiety. 02/22/21  Yes [provider]  ibuprofen (ADVIL,MOTRIN) 200 MG tablet Take 800 mg by mouth as needed for headache or moderate pain.    Yes [provider]  Lactobacillus (PROBIOTIC ACIDOPHILUS PO) Take 1 capsule by mouth daily.    Yes [provider]  lamoTRIgine (LAMICTAL) 100 MG tablet Take 1/2 tab in AM, 1/2 tab in PM Patient taking differently: Take 50 mg by mouth 2 (two) times daily. 12/09/20  Yes Van ClinesAquino, Karen M, MD  loratadine (CLARITIN) 10 MG tablet Take 10 mg by mouth daily.   Yes [provider]  Multiple Vitamins-Minerals (ONE-A-DAY WOMENS PO) Take 1 tablet by mouth daily.   Yes [provider]  sertraline (ZOLOFT) 100 MG tablet Take 200 mg by mouth at bedtime. 03/03/20  Yes [provider]  zonisamide (ZONEGRAN) 100 MG capsule Take 5 capsules nightly Patient taking differently: Take 500 mg by mouth at bedtime. 12/09/20  Yes Van ClinesAquino, Karen M, MD    Allergies    Wellbutrin [bupropion hcl], Escitalopram oxalate, and  Topiramate  Review of Systems   Review of Systems  Constitutional: Negative for activity change, chills, diaphoresis and fever.  HENT: Negative for congestion and sore throat.   Eyes: Negative for visual disturbance.  Respiratory: Positive for cough and shortness of breath. Negative for chest tightness and wheezing.   Cardiovascular: Positive for chest pain. Negative for palpitations.  Gastrointestinal: Negative for abdominal pain, diarrhea, nausea and vomiting.  Genitourinary: Negative for dysuria.  Musculoskeletal: Negative for back pain, myalgias and neck pain.  Skin: Negative for rash and wound.  Allergic/Immunologic: Negative for immunocompromised state.  Neurological: Negative for seizures, weakness, numbness and headaches.  Psychiatric/Behavioral: Negative for confusion.    Physical Exam Updated Vital Signs BP 122/71   Pulse 60  Temp 98.1 F (36.7 C) (Oral)   Resp 13   LMP 04/27/2016 (Approximate)   SpO2 97%   Physical Exam Vitals and nursing note reviewed.  Constitutional:      General: She is not in acute distress.    Appearance: She is not ill-appearing, toxic-appearing or diaphoretic.  HENT:     Head: Normocephalic.  Eyes:     Conjunctiva/sclera: Conjunctivae normal.  Cardiovascular:     Rate and Rhythm: Normal rate and regular rhythm.     Pulses: Normal pulses.     Heart sounds: Normal heart sounds. No murmur heard. No friction rub. No gallop.   Pulmonary:     Effort: Pulmonary effort is normal. No respiratory distress.     Breath sounds: No stridor. Wheezing present. No rhonchi or rales.     Comments: And expiratory wheezes in the bilateral bases.  No rhonchi or rales.  No increased work of breathing.  Patient is able to speak in complete, fluent sentences without increased work of breathing. Chest:     Chest wall: No tenderness.  Abdominal:     General: There is no distension.     Palpations: Abdomen is soft. There is no mass.     Tenderness: There is  no abdominal tenderness. There is no right CVA tenderness, left CVA tenderness, guarding or rebound.     Hernia: No hernia is present.  Musculoskeletal:     Cervical back: Neck supple.     Right lower leg: No edema.     Left lower leg: No edema.  Skin:    General: Skin is warm.     Findings: No rash.  Neurological:     Mental Status: She is alert.     Sensory: No sensory deficit.     Comments: Alert and oriented x3  Psychiatric:        Behavior: Behavior normal.     ED Results / Procedures / Treatments   Labs (all labs ordered are listed, but only abnormal results are displayed) Labs Reviewed  BASIC METABOLIC PANEL - Abnormal; Notable for the following components:      Result Value   Potassium 3.2 (*)    Glucose, Bld 148 (*)    All other components within normal limits  CBC WITH DIFFERENTIAL/PLATELET - Abnormal; Notable for the following components:   WBC 12.8 (*)    Platelets 404 (*)    Neutro Abs 8.5 (*)    Abs Immature Granulocytes 0.22 (*)    All other components within normal limits  D-DIMER, QUANTITATIVE  TROPONIN I (HIGH SENSITIVITY)  TROPONIN I (HIGH SENSITIVITY)    EKG None  Radiology DG Chest 2 View  Result Date: 03/03/2021 CLINICAL DATA:  Chest pain, shortness of breath and dizziness. EXAM: CHEST - 2 VIEW COMPARISON:  July 05, 2009 FINDINGS: The heart size and mediastinal contours are within normal limits. Both lungs are clear. The visualized skeletal structures are unremarkable. IMPRESSION: No active cardiopulmonary disease. Electronically Signed   By: Aram Candela M.D.   On: 03/03/2021 16:34    Procedures Procedures   Medications Ordered in ED Medications  ipratropium-albuterol (DUONEB) 0.5-2.5 (3) MG/3ML nebulizer solution 3 mL (3 mLs Nebulization Given 03/04/21 0152)    ED Course  I have reviewed the triage vital signs and the nursing notes.  Pertinent labs & imaging results that were available during my care of the patient were reviewed  by me and considered in my medical decision making (see chart for details).  MDM Rules/Calculators/A&P                          45 year old female with a history of epilepsy, depression, and endometriosis who has been symptomatic from COVID since April 24 and had a positive test on April 27.  She presents with a 1 day history of worsening shortness of breath and chest pain.  Minimal tachycardic on arrival that is since resolved without treatment.  Vital signs are otherwise unremarkable.  On exam, she has end expiratory wheezes in the bilateral bases without increased work of breathing.  Physical exam is overall reassuring.  Labs been reviewed and independently interpreted by me.  EKG with normal sinus rhythm.  Delta troponin is not elevated.  D-dimer is elevated.  No metabolic derangements.  She does have a mild leukocytosis, but this is likely reactive secondary to corticosteroid use.  Chest x-ray is unremarkable.  I suspect her symptoms are secondary to COVID-19 infection, but I also considered ACS, PE, aortic dissection, esophageal rupture, or secondary bacterial pneumonia, esophagitis, on the differential diagnosis.  The patient was given a DuoNeb and ambulated on pulse ox in the ER with oxygen saturation of 99%.  I did offer to represcribe her a course of prednisone, but the patient declined.  Will discharge home with close outpatient follow-up and continued albuterol use.  She does have a home pulse oximeter that she can use to continue to monitor her symptoms.  ER return precautions given.  She is hemodynamically stable no acute distress.  Safer discharge home with outpatient follow-up as indicated.  Final Clinical Impression(s) / ED Diagnoses Final diagnoses:  COVID-19    Rx / DC Orders ED Discharge Orders    None       Barkley Boards, PA-C 03/04/21 0218    Mesner, Barbara Cower, MD 03/04/21 [phone]

## 2021-03-04 NOTE — ED Notes (Signed)
Pt ambulated in the hallway sats 199% the entire time

## 2021-03-04 NOTE — Discharge Instructions (Addendum)
Thank you for allowing me to care for you today in the Emergency Department.   Your work-up today was reassuring for chest pain or shortness of breath.  Your symptoms are most likely related to COVID-19 infection.  Take 650 mg of Tylenol or 600 mg of ibuprofen with food every 6 hours for fever or pain.  You can alternate between these 2 medications every 3 hours if your pain returns.  For instance, you can take Tylenol at noon, followed by a dose of ibuprofen at 3, followed by second dose of Tylenol and 6.  Continue to use 2 puffs of your home albuterol inhaler every 4 hours as needed for wheezing or shortness of breath.  Follow-up with primary care if your symptoms do not significantly improve.  Return to the emergency department if you pass out, develop respiratory distress, if your fingers or lips turn blue, or if you develop other new, concerning symptoms.

## 2021-08-18 ENCOUNTER — Encounter: Payer: Self-pay | Admitting: Neurology

## 2021-08-18 ENCOUNTER — Ambulatory Visit: Payer: 59 | Admitting: Neurology

## 2021-08-18 ENCOUNTER — Other Ambulatory Visit: Payer: Self-pay

## 2021-08-18 VITALS — BP 94/68 | HR 66 | Ht 66.0 in | Wt 245.0 lb

## 2021-08-18 DIAGNOSIS — R200 Anesthesia of skin: Secondary | ICD-10-CM | POA: Diagnosis not present

## 2021-08-18 DIAGNOSIS — G40309 Generalized idiopathic epilepsy and epileptic syndromes, not intractable, without status epilepticus: Secondary | ICD-10-CM | POA: Diagnosis not present

## 2021-08-18 MED ORDER — ZONISAMIDE 100 MG PO CAPS: ORAL_CAPSULE | ORAL | 3 refills | Status: DC

## 2021-08-18 NOTE — Patient Instructions (Signed)
Good to see you!  Continue Zonisamide 500mg  every night  2. Continue with Lamictal as prescribed by your psychiatrist  3. Try using a wrist brace for the hand numbness. If symptoms worsen, call our office and we can schedule a nerve test  4. Follow-up in 1 year, call for any changes   Seizure Precautions: 1. If medication has been prescribed for you to prevent seizures, take it exactly as directed.  Do not stop taking the medicine without talking to your doctor first, even if you have not had a seizure in a long time.   2. Avoid activities in which a seizure would cause danger to yourself or to others.  Don't operate dangerous machinery, swim alone, or climb in high or dangerous places, such as on ladders, roofs, or girders.  Do not drive unless your doctor says you may.  3. If you have any warning that you may have a seizure, lay down in a safe place where you can't hurt yourself.    4.  No driving for 6 months from last seizure, as per [facility].   Please refer to the following link on the [employer]'s website for more information: http://www.epilepsyfoundation.org/answerplace/Social/driving/drivingu.cfm   5.  Maintain good sleep hygiene. Avoid alcohol.  6.  Contact your doctor if you have any problems that may be related to the medicine you are taking.  7.  Call 911 and bring the patient back to the ED if:        A.  The seizure lasts longer than 5 minutes.       B.  The patient doesn't awaken shortly after the seizure  C.  The patient has new problems such as difficulty seeing, speaking or moving  D.  The patient was injured during the seizure  E.  The patient has a temperature over 102 F (39C)  F.  The patient vomited and now is having trouble breathing

## 2021-08-18 NOTE — Progress Notes (Signed)
NEUROLOGY FOLLOW UP OFFICE NOTE  April Campbell [id number] 24-Feb-1976  HISTORY OF PRESENT ILLNESS: I had the pleasure of seeing April Campbell in follow-up in the neurology clinic on 08/18/2021.  The patient was last seen 8 months ago for primary generalized epilepsy. Since her last visit, she continues to do well from a seizure standpoint with no GTCs since 2017. She was previously having panic attacks, she states these are not as much. She had been on Lamotrigine 100mg  1/2 tab BID (50mg  BID) and Zonisamide 500mg  qhs without side effects. She reports today that her psychiatrist recently increased Lamotrigine to 50mg  in AM, 75mg  in PM with plans to further increase dose for impulsive/depression issues. Her mother recently passed away. She denies any staring/unresponsive episodes. She has occasional mild twitching. She has noticed both hands go numb throughout the day,left more than right. No neck/wrist pain, no weakness. She does a lot of typing. Feet are unaffected. She had been doing well with headaches until this week, which she attributes to recent temperature changes. No dizziness, vision changes, no falls. Sleep is good.   History on Initial Assessment 03/25/2020: This is a 44 year old right-handed woman with a history of anxiety, depression, migraines, presenting for a second opinion regarding epilepsy. Records from [facility] were reviewed. She was last seen at [facility] 2 months ago. She had a GTC in 2002 while on Wellbutrin, EEG at that time showed bifrontal spikes in drowsiness. She was started on Depakote but had side effects. She went to [facility] for a second opinion and had a normal MRI and EEG, tapered off Depakote. Seizures recurred in 2010 and she was started on Levetiracetam, however it was ineffective and she was changed to Zonisamide and Lamotrigine. She had a breakthrough seizure due to medication refill mixup in 2013 and was seizure-free until 2019 when trial of Zonisamide taper was done.  She started having recurrent episodes of feeling like a seizure was coming on with a feeling of warmth and everything around her going faster. She also reported deja vu feelings. She was reporting cognitive and word-finding issues, she felt she was overmedicated and had an EMU admission in February 2021 where Lamotrigine and Zonisamide were held. Typical events were not captured, however baseline EEG showed generalized 3 Hz polyspike and wave interictal activity. She was told that there was no need for LTG, on her follow-up in March 2021, she reported feeling very anxious, especially about not being tapered off Lamotrigine and poor communication about restarting it. She felt a "lack of support" for her symptoms. Lamotrigine was restarted by her PCP to 100mg  in AM, 150mg  in PM, but now she is on 50mg  BID. She continues on Zonisamide 500mg  qhs with no side effects. She continues to report word-finding difficulties. She is feeling a lot better with reduction in Lamotrigine dose and follows up with Psychiatry. She denies any significant falls.  She states she has had 3 or 4 GTCs in her lifetime. She denies losing consciousness with most of her seizures, she describes sensations of high energy inside but everything around is moving in slow motion, "like I can see the future." Sometimes there is a buzzing, strong, heat, strong fear, there is always an intense sensation that started in her childhood. In HS this was attributed to caffeine, then she had the first GTC in her early 61s attributed to Wellbutrin. Her last GTC was in 2017. Seizure triggers include lack of sleep/food, stress. She reports that seizures had quieted  down prior to this year with a lot going on. This past months she feels she is doing better, previously she was having smaller seizures once a week, lasting for a minute. She feels tired and shaken up after, no focal weakness. They deny any staring/unresponsive episodes, she can talk and respond. Her  husband reports she twitches a lot in her sleep. She has migraines once a week and takes prn Advil and Cambia. There is occasional photophobia, no nausea/vomiting. She denies any dizziness, diplopia, dysarthria/dysphagia, bladder dysfunction. She has neck pain, occasional tingling in both arms. She has constipation.   Prior AEDs: Topamax, Depakote, Keppra  Diagnostic Data: EMU admission Feb 1-4, 2021 at [facility]: Typical events were not captured. Baseline showed generalized 3 Hz PSW interictal activity. MRI brain in 2017 without contrast was normal.   PAST MEDICAL HISTORY: Past Medical History:  Diagnosis Date   Anxiety    Depression    Endometriosis    GERD (gastroesophageal reflux disease)    History of migraine headaches    Seizures (HCC)    controlled with meds, last seizure 4-5 yrs ago    MEDICATIONS: Current Outpatient Medications on File Prior to Visit  Medication Sig Dispense Refill   famotidine (PEPCID) 20 MG tablet Take 20 mg by mouth at bedtime.     fluticasone (FLONASE) 50 MCG/ACT nasal spray Place 2 sprays into both nostrils daily. 16 g 0   hydrOXYzine (ATARAX/VISTARIL) 10 MG tablet Take 10 mg by mouth 3 (three) times daily as needed for anxiety.     ibuprofen (ADVIL,MOTRIN) 200 MG tablet Take 800 mg by mouth as needed for headache or moderate pain.      Lactobacillus (PROBIOTIC ACIDOPHILUS PO) Take 1 capsule by mouth daily.      lamoTRIgine (LAMICTAL) 100 MG tablet Take 1/2 tab in AM, 1/2 tab in PM (Patient taking differently: Take 50 mg by mouth 2 (two) times daily.)     loratadine (CLARITIN) 10 MG tablet Take 10 mg by mouth daily.     Multiple Vitamins-Minerals (ONE-A-DAY WOMENS PO) Take 1 tablet by mouth daily.     sertraline (ZOLOFT) 100 MG tablet Take 200 mg by mouth at bedtime.     zonisamide (ZONEGRAN) 100 MG capsule Take 5 capsules nightly (Patient taking differently: Take 500 mg by mouth at bedtime.) 450 capsule 3   No current facility-administered medications on  file prior to visit.    ALLERGIES: Allergies  Allergen Reactions   Wellbutrin [Bupropion Hcl] Other (See Comments)    SEIZURE   Escitalopram Oxalate Other (See Comments)    Decreased sex drive   Topiramate Other (See Comments)    foggy    FAMILY HISTORY: Family History  Problem Relation Age of Onset   Hypertension Mother    Diabetes Mother    Irritable bowel syndrome Mother    Hypertension Maternal Grandmother    Diabetes Maternal Grandmother    Other Father        bile duct cancer   Irritable bowel syndrome Maternal Aunt     SOCIAL HISTORY: Social History   Socioeconomic History   Marital status: Married    Spouse name: Not on file   Number of children: 0   Years of education: Not on file   Highest education level: Not on file  Occupational History   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  Tobacco Use   Smoking status: Never   Smokeless tobacco: Never  Vaping Use   Vaping Use: Never used  Substance and Sexual Activity   Alcohol use: Yes    Alcohol/week: 2.0 standard drinks    Types: 2 Standard drinks or equivalent per week   Drug use: No   Sexual activity: Yes    Partners: Male    Birth control/protection: Surgical    Comment: husband vasectomy/ Hysterectomy  Other Topics Concern   Not on file  Social History Narrative   Right handed    Lives with husband    Social Determinants of Health   Financial Resource Strain: Not on file  Food Insecurity: Not on file  Transportation Needs: Not on file  Physical Activity: Not on file  Stress: Not on file  Social Connections: Not on file  Intimate Partner Violence: Not on file     PHYSICAL EXAM: Vitals:   08/18/21 1505  BP: 94/68  Pulse: 66  SpO2: 97%   General: No acute distress Head:  Normocephalic/atraumatic Skin/Extremities: No rash, no edema Neurological Exam: alert and awake. No aphasia or dysarthria. Fund of knowledge is appropriate.  Recent and remote memory are intact.  Attention  and concentration are normal.   Cranial nerves: Pupils equal, round. Extraocular movements intact with no nystagmus. Visual fields full.  No facial asymmetry.  Motor: Bulk and tone normal, muscle strength 5/5 throughout with no pronator drift. Sensation intact to cold. Reflexes brisk +2 throughout.  Finger to nose testing intact.  Gait narrow-based and steady, able to tandem walk adequately.  Romberg negative.   IMPRESSION: This is a pleasant 45 yo RH woman with primary generalized epilepsy. EEG showed 3 Hz polyspike and wave discharges, MRI brain normal. She had significant worsening of depression and anxiety with sudden discontinuation of Lamotrigine during EMU admission in 2021, symptoms improved with restarting Lamotrigine. No GTCs since 2017, no significant panic attacks. Continue Zonisamide 500mg  qhs, her psychiatrist has been increasing Lamotrigine dose, currently on 50mg  in AM, 75mg  in PM. She is aware of [place] driving laws to stop driving after a seizure until 6 months seizure-free. She is reporting bilateral hand numbness, exam normal, advised using a wrist brace. If symptoms worsen, we can do an EMG/NCV. Follow-up in 1 year, call for any changes.    Thank you for allowing me to participate in her care.  Please do not hesitate to call for any questions or concerns.    , M.D.   CC: Dr. 

## 2021-09-04 DIAGNOSIS — S83282A Other tear of lateral meniscus, current injury, left knee, initial encounter: Secondary | ICD-10-CM | POA: Diagnosis not present

## 2021-09-04 DIAGNOSIS — S83232A Complex tear of medial meniscus, current injury, left knee, initial encounter: Secondary | ICD-10-CM | POA: Diagnosis not present

## 2021-09-04 DIAGNOSIS — M94262 Chondromalacia, left knee: Secondary | ICD-10-CM | POA: Diagnosis not present

## 2021-10-09 DIAGNOSIS — F321 Major depressive disorder, single episode, moderate: Secondary | ICD-10-CM | POA: Diagnosis not present

## 2021-10-09 DIAGNOSIS — F410 Panic disorder [episodic paroxysmal anxiety] without agoraphobia: Secondary | ICD-10-CM | POA: Diagnosis not present

## 2021-10-16 DIAGNOSIS — F410 Panic disorder [episodic paroxysmal anxiety] without agoraphobia: Secondary | ICD-10-CM | POA: Diagnosis not present

## 2021-10-16 DIAGNOSIS — F321 Major depressive disorder, single episode, moderate: Secondary | ICD-10-CM | POA: Diagnosis not present

## 2021-10-31 DIAGNOSIS — F411 Generalized anxiety disorder: Secondary | ICD-10-CM | POA: Diagnosis not present

## 2021-11-01 DIAGNOSIS — F411 Generalized anxiety disorder: Secondary | ICD-10-CM | POA: Diagnosis not present

## 2021-11-03 DIAGNOSIS — F321 Major depressive disorder, single episode, moderate: Secondary | ICD-10-CM | POA: Diagnosis not present

## 2021-11-03 DIAGNOSIS — F410 Panic disorder [episodic paroxysmal anxiety] without agoraphobia: Secondary | ICD-10-CM | POA: Diagnosis not present

## 2021-11-06 DIAGNOSIS — F321 Major depressive disorder, single episode, moderate: Secondary | ICD-10-CM | POA: Diagnosis not present

## 2021-11-06 DIAGNOSIS — F410 Panic disorder [episodic paroxysmal anxiety] without agoraphobia: Secondary | ICD-10-CM | POA: Diagnosis not present

## 2021-11-08 DIAGNOSIS — F411 Generalized anxiety disorder: Secondary | ICD-10-CM | POA: Diagnosis not present

## 2021-11-14 DIAGNOSIS — F32A Depression, unspecified: Secondary | ICD-10-CM | POA: Diagnosis not present

## 2021-11-14 DIAGNOSIS — F411 Generalized anxiety disorder: Secondary | ICD-10-CM | POA: Diagnosis not present

## 2021-11-14 DIAGNOSIS — F419 Anxiety disorder, unspecified: Secondary | ICD-10-CM | POA: Diagnosis not present

## 2021-11-14 DIAGNOSIS — F4321 Adjustment disorder with depressed mood: Secondary | ICD-10-CM | POA: Diagnosis not present

## 2021-11-15 DIAGNOSIS — F411 Generalized anxiety disorder: Secondary | ICD-10-CM | POA: Diagnosis not present

## 2021-11-20 DIAGNOSIS — F410 Panic disorder [episodic paroxysmal anxiety] without agoraphobia: Secondary | ICD-10-CM | POA: Diagnosis not present

## 2021-11-20 DIAGNOSIS — F321 Major depressive disorder, single episode, moderate: Secondary | ICD-10-CM | POA: Diagnosis not present

## 2021-11-22 DIAGNOSIS — F411 Generalized anxiety disorder: Secondary | ICD-10-CM | POA: Diagnosis not present

## 2021-11-27 DIAGNOSIS — F411 Generalized anxiety disorder: Secondary | ICD-10-CM | POA: Diagnosis not present

## 2021-11-27 DIAGNOSIS — F410 Panic disorder [episodic paroxysmal anxiety] without agoraphobia: Secondary | ICD-10-CM | POA: Diagnosis not present

## 2021-11-27 DIAGNOSIS — F419 Anxiety disorder, unspecified: Secondary | ICD-10-CM | POA: Diagnosis not present

## 2021-11-27 DIAGNOSIS — F4321 Adjustment disorder with depressed mood: Secondary | ICD-10-CM | POA: Diagnosis not present

## 2021-11-27 DIAGNOSIS — F32A Depression, unspecified: Secondary | ICD-10-CM | POA: Diagnosis not present

## 2021-11-27 DIAGNOSIS — F321 Major depressive disorder, single episode, moderate: Secondary | ICD-10-CM | POA: Diagnosis not present

## 2021-11-29 DIAGNOSIS — F411 Generalized anxiety disorder: Secondary | ICD-10-CM | POA: Diagnosis not present

## 2021-12-04 DIAGNOSIS — F410 Panic disorder [episodic paroxysmal anxiety] without agoraphobia: Secondary | ICD-10-CM | POA: Diagnosis not present

## 2021-12-04 DIAGNOSIS — F321 Major depressive disorder, single episode, moderate: Secondary | ICD-10-CM | POA: Diagnosis not present

## 2021-12-06 DIAGNOSIS — F411 Generalized anxiety disorder: Secondary | ICD-10-CM | POA: Diagnosis not present

## 2021-12-08 DIAGNOSIS — J3489 Other specified disorders of nose and nasal sinuses: Secondary | ICD-10-CM | POA: Diagnosis not present

## 2021-12-11 DIAGNOSIS — F321 Major depressive disorder, single episode, moderate: Secondary | ICD-10-CM | POA: Diagnosis not present

## 2021-12-11 DIAGNOSIS — F419 Anxiety disorder, unspecified: Secondary | ICD-10-CM | POA: Diagnosis not present

## 2021-12-11 DIAGNOSIS — Z79899 Other long term (current) drug therapy: Secondary | ICD-10-CM | POA: Diagnosis not present

## 2021-12-11 DIAGNOSIS — F411 Generalized anxiety disorder: Secondary | ICD-10-CM | POA: Diagnosis not present

## 2021-12-11 DIAGNOSIS — F410 Panic disorder [episodic paroxysmal anxiety] without agoraphobia: Secondary | ICD-10-CM | POA: Diagnosis not present

## 2021-12-11 DIAGNOSIS — F332 Major depressive disorder, recurrent severe without psychotic features: Secondary | ICD-10-CM | POA: Diagnosis not present

## 2021-12-13 DIAGNOSIS — F411 Generalized anxiety disorder: Secondary | ICD-10-CM | POA: Diagnosis not present

## 2021-12-18 DIAGNOSIS — F410 Panic disorder [episodic paroxysmal anxiety] without agoraphobia: Secondary | ICD-10-CM | POA: Diagnosis not present

## 2021-12-18 DIAGNOSIS — F321 Major depressive disorder, single episode, moderate: Secondary | ICD-10-CM | POA: Diagnosis not present

## 2021-12-22 DIAGNOSIS — H5213 Myopia, bilateral: Secondary | ICD-10-CM | POA: Diagnosis not present

## 2021-12-22 DIAGNOSIS — G43909 Migraine, unspecified, not intractable, without status migrainosus: Secondary | ICD-10-CM | POA: Diagnosis not present

## 2021-12-25 DIAGNOSIS — F321 Major depressive disorder, single episode, moderate: Secondary | ICD-10-CM | POA: Diagnosis not present

## 2021-12-25 DIAGNOSIS — Z20822 Contact with and (suspected) exposure to covid-19: Secondary | ICD-10-CM | POA: Diagnosis not present

## 2021-12-25 DIAGNOSIS — R6883 Chills (without fever): Secondary | ICD-10-CM | POA: Diagnosis not present

## 2021-12-25 DIAGNOSIS — F410 Panic disorder [episodic paroxysmal anxiety] without agoraphobia: Secondary | ICD-10-CM | POA: Diagnosis not present

## 2021-12-26 DIAGNOSIS — F411 Generalized anxiety disorder: Secondary | ICD-10-CM | POA: Diagnosis not present

## 2022-01-01 DIAGNOSIS — F410 Panic disorder [episodic paroxysmal anxiety] without agoraphobia: Secondary | ICD-10-CM | POA: Diagnosis not present

## 2022-01-01 DIAGNOSIS — F321 Major depressive disorder, single episode, moderate: Secondary | ICD-10-CM | POA: Diagnosis not present

## 2022-01-05 DIAGNOSIS — F411 Generalized anxiety disorder: Secondary | ICD-10-CM | POA: Diagnosis not present

## 2022-01-17 DIAGNOSIS — E669 Obesity, unspecified: Secondary | ICD-10-CM | POA: Diagnosis not present

## 2022-01-17 DIAGNOSIS — U071 COVID-19: Secondary | ICD-10-CM | POA: Diagnosis not present

## 2022-01-24 DIAGNOSIS — F321 Major depressive disorder, single episode, moderate: Secondary | ICD-10-CM | POA: Diagnosis not present

## 2022-01-24 DIAGNOSIS — F410 Panic disorder [episodic paroxysmal anxiety] without agoraphobia: Secondary | ICD-10-CM | POA: Diagnosis not present

## 2022-01-26 DIAGNOSIS — F411 Generalized anxiety disorder: Secondary | ICD-10-CM | POA: Diagnosis not present

## 2022-02-09 DIAGNOSIS — F411 Generalized anxiety disorder: Secondary | ICD-10-CM | POA: Diagnosis not present

## 2022-02-12 DIAGNOSIS — F410 Panic disorder [episodic paroxysmal anxiety] without agoraphobia: Secondary | ICD-10-CM | POA: Diagnosis not present

## 2022-02-12 DIAGNOSIS — F321 Major depressive disorder, single episode, moderate: Secondary | ICD-10-CM | POA: Diagnosis not present

## 2022-02-12 DIAGNOSIS — F332 Major depressive disorder, recurrent severe without psychotic features: Secondary | ICD-10-CM | POA: Diagnosis not present

## 2022-02-12 DIAGNOSIS — F411 Generalized anxiety disorder: Secondary | ICD-10-CM | POA: Diagnosis not present

## 2022-02-12 DIAGNOSIS — F419 Anxiety disorder, unspecified: Secondary | ICD-10-CM | POA: Diagnosis not present

## 2022-02-12 DIAGNOSIS — F4321 Adjustment disorder with depressed mood: Secondary | ICD-10-CM | POA: Diagnosis not present

## 2022-02-14 DIAGNOSIS — F411 Generalized anxiety disorder: Secondary | ICD-10-CM | POA: Diagnosis not present

## 2022-02-15 DIAGNOSIS — M25571 Pain in right ankle and joints of right foot: Secondary | ICD-10-CM | POA: Diagnosis not present

## 2022-02-15 DIAGNOSIS — S93401A Sprain of unspecified ligament of right ankle, initial encounter: Secondary | ICD-10-CM | POA: Diagnosis not present

## 2022-02-15 DIAGNOSIS — S8261XA Displaced fracture of lateral malleolus of right fibula, initial encounter for closed fracture: Secondary | ICD-10-CM | POA: Diagnosis not present

## 2022-02-19 DIAGNOSIS — F321 Major depressive disorder, single episode, moderate: Secondary | ICD-10-CM | POA: Diagnosis not present

## 2022-02-19 DIAGNOSIS — F410 Panic disorder [episodic paroxysmal anxiety] without agoraphobia: Secondary | ICD-10-CM | POA: Diagnosis not present

## 2022-02-23 DIAGNOSIS — F411 Generalized anxiety disorder: Secondary | ICD-10-CM | POA: Diagnosis not present

## 2022-02-27 DIAGNOSIS — M25571 Pain in right ankle and joints of right foot: Secondary | ICD-10-CM | POA: Diagnosis not present

## 2022-02-27 DIAGNOSIS — S8264XA Nondisplaced fracture of lateral malleolus of right fibula, initial encounter for closed fracture: Secondary | ICD-10-CM | POA: Diagnosis not present

## 2022-02-28 DIAGNOSIS — F411 Generalized anxiety disorder: Secondary | ICD-10-CM | POA: Diagnosis not present

## 2022-03-09 DIAGNOSIS — F411 Generalized anxiety disorder: Secondary | ICD-10-CM | POA: Diagnosis not present

## 2022-03-16 DIAGNOSIS — F411 Generalized anxiety disorder: Secondary | ICD-10-CM | POA: Diagnosis not present

## 2022-03-20 DIAGNOSIS — S8264XA Nondisplaced fracture of lateral malleolus of right fibula, initial encounter for closed fracture: Secondary | ICD-10-CM | POA: Diagnosis not present

## 2022-03-22 DIAGNOSIS — F321 Major depressive disorder, single episode, moderate: Secondary | ICD-10-CM | POA: Diagnosis not present

## 2022-03-22 DIAGNOSIS — Z79899 Other long term (current) drug therapy: Secondary | ICD-10-CM | POA: Diagnosis not present

## 2022-03-22 DIAGNOSIS — F411 Generalized anxiety disorder: Secondary | ICD-10-CM | POA: Diagnosis not present

## 2022-03-22 DIAGNOSIS — F332 Major depressive disorder, recurrent severe without psychotic features: Secondary | ICD-10-CM | POA: Diagnosis not present

## 2022-03-22 DIAGNOSIS — F4321 Adjustment disorder with depressed mood: Secondary | ICD-10-CM | POA: Diagnosis not present

## 2022-03-22 DIAGNOSIS — F410 Panic disorder [episodic paroxysmal anxiety] without agoraphobia: Secondary | ICD-10-CM | POA: Diagnosis not present

## 2022-03-27 DIAGNOSIS — F410 Panic disorder [episodic paroxysmal anxiety] without agoraphobia: Secondary | ICD-10-CM | POA: Diagnosis not present

## 2022-03-27 DIAGNOSIS — F321 Major depressive disorder, single episode, moderate: Secondary | ICD-10-CM | POA: Diagnosis not present

## 2022-04-02 DIAGNOSIS — F321 Major depressive disorder, single episode, moderate: Secondary | ICD-10-CM | POA: Diagnosis not present

## 2022-04-02 DIAGNOSIS — F410 Panic disorder [episodic paroxysmal anxiety] without agoraphobia: Secondary | ICD-10-CM | POA: Diagnosis not present

## 2022-04-06 DIAGNOSIS — F411 Generalized anxiety disorder: Secondary | ICD-10-CM | POA: Diagnosis not present

## 2022-04-09 DIAGNOSIS — F321 Major depressive disorder, single episode, moderate: Secondary | ICD-10-CM | POA: Diagnosis not present

## 2022-04-09 DIAGNOSIS — F410 Panic disorder [episodic paroxysmal anxiety] without agoraphobia: Secondary | ICD-10-CM | POA: Diagnosis not present

## 2022-04-12 DIAGNOSIS — F411 Generalized anxiety disorder: Secondary | ICD-10-CM | POA: Diagnosis not present

## 2022-04-16 DIAGNOSIS — F410 Panic disorder [episodic paroxysmal anxiety] without agoraphobia: Secondary | ICD-10-CM | POA: Diagnosis not present

## 2022-04-16 DIAGNOSIS — F321 Major depressive disorder, single episode, moderate: Secondary | ICD-10-CM | POA: Diagnosis not present

## 2022-04-25 DIAGNOSIS — F410 Panic disorder [episodic paroxysmal anxiety] without agoraphobia: Secondary | ICD-10-CM | POA: Diagnosis not present

## 2022-04-25 DIAGNOSIS — F321 Major depressive disorder, single episode, moderate: Secondary | ICD-10-CM | POA: Diagnosis not present

## 2022-04-27 DIAGNOSIS — F411 Generalized anxiety disorder: Secondary | ICD-10-CM | POA: Diagnosis not present

## 2022-05-03 DIAGNOSIS — F321 Major depressive disorder, single episode, moderate: Secondary | ICD-10-CM | POA: Diagnosis not present

## 2022-05-03 DIAGNOSIS — F410 Panic disorder [episodic paroxysmal anxiety] without agoraphobia: Secondary | ICD-10-CM | POA: Diagnosis not present

## 2022-05-08 DIAGNOSIS — S8264XA Nondisplaced fracture of lateral malleolus of right fibula, initial encounter for closed fracture: Secondary | ICD-10-CM | POA: Diagnosis not present

## 2022-05-10 DIAGNOSIS — F321 Major depressive disorder, single episode, moderate: Secondary | ICD-10-CM | POA: Diagnosis not present

## 2022-05-10 DIAGNOSIS — F410 Panic disorder [episodic paroxysmal anxiety] without agoraphobia: Secondary | ICD-10-CM | POA: Diagnosis not present

## 2022-05-11 DIAGNOSIS — F411 Generalized anxiety disorder: Secondary | ICD-10-CM | POA: Diagnosis not present

## 2022-05-14 DIAGNOSIS — F410 Panic disorder [episodic paroxysmal anxiety] without agoraphobia: Secondary | ICD-10-CM | POA: Diagnosis not present

## 2022-05-14 DIAGNOSIS — F321 Major depressive disorder, single episode, moderate: Secondary | ICD-10-CM | POA: Diagnosis not present

## 2022-05-16 ENCOUNTER — Encounter (HOSPITAL_BASED_OUTPATIENT_CLINIC_OR_DEPARTMENT_OTHER): Payer: Self-pay | Admitting: Obstetrics & Gynecology

## 2022-05-16 ENCOUNTER — Ambulatory Visit (HOSPITAL_BASED_OUTPATIENT_CLINIC_OR_DEPARTMENT_OTHER): Payer: BC Managed Care – PPO | Admitting: Obstetrics & Gynecology

## 2022-05-16 VITALS — BP 136/79 | HR 70 | Ht 66.0 in | Wt 252.2 lb

## 2022-05-16 DIAGNOSIS — N9089 Other specified noninflammatory disorders of vulva and perineum: Secondary | ICD-10-CM

## 2022-05-16 DIAGNOSIS — N393 Stress incontinence (female) (male): Secondary | ICD-10-CM | POA: Diagnosis not present

## 2022-05-16 DIAGNOSIS — Z1231 Encounter for screening mammogram for malignant neoplasm of breast: Secondary | ICD-10-CM | POA: Diagnosis not present

## 2022-05-16 DIAGNOSIS — N951 Menopausal and female climacteric states: Secondary | ICD-10-CM | POA: Diagnosis not present

## 2022-05-16 NOTE — Progress Notes (Signed)
GYNECOLOGY  VISIT  CC:   labia cyst/mass  HPI: 46 y.o. G0P0000 Married White or Caucasian female here for complaint of labia mass/cyst that she noted a little more than a week ago.  There was some associated pain.  The lesion is smaller than is was a week ago.  She also noted some swollen lymph nodes in her neck and wondered if this was related.  This feels back to normal.  Has experienced some vaginal discharge but denies vaginal bleeding.  Denies urinary symptoms but she does report some urinary leakage with cough.  Not really where she is in relation to menopause.  Not sure if some symptoms are related or not.  Blood work discussed.   Past Medical History:  Diagnosis Date   Anxiety    Depression    Endometriosis    GERD (gastroesophageal reflux disease)    History of migraine headaches    Seizures (HCC)    controlled with meds, last seizure 4-5 yrs ago    MEDS:   Current Outpatient Medications on File Prior to Visit  Medication Sig Dispense Refill   ARIPiprazole (ABILIFY) 2 MG tablet Take 2 mg by mouth daily.     famotidine (PEPCID) 20 MG tablet Take 20 mg by mouth at bedtime.     hydrOXYzine (ATARAX/VISTARIL) 10 MG tablet Take 10 mg by mouth 3 (three) times daily as needed for anxiety.     ibuprofen (ADVIL,MOTRIN) 200 MG tablet Take 800 mg by mouth as needed for headache or moderate pain.      Lactobacillus (PROBIOTIC ACIDOPHILUS PO) Take 1 capsule by mouth daily.      lamoTRIgine (LAMICTAL) 100 MG tablet Take 1/2 tab in AM, 1/2 tab in PM (Patient taking differently: Take 50 mg by mouth 2 (two) times daily. 50mg  in the 75 mg at night)     loratadine (CLARITIN) 10 MG tablet Take 10 mg by mouth daily.     Multiple Vitamins-Minerals (ONE-A-DAY WOMENS PO) Take 1 tablet by mouth daily.     sertraline (ZOLOFT) 100 MG tablet Take 200 mg by mouth at bedtime.     zonisamide (ZONEGRAN) 100 MG capsule Take 5 capsules nightly 450 capsule 3   No current facility-administered medications on  file prior to visit.    ALLERGIES: Wellbutrin [bupropion hcl], Escitalopram oxalate, and Topiramate  SH:  married, non smoker  Review of Systems  Genitourinary:        Vulvar lesion    PHYSICAL EXAMINATION:    BP 136/79 (BP Location: Right Arm, Patient Position: Sitting, Cuff Size: Large)   Pulse 70   Ht 5\' 6"  (1.676 m) Comment: reported  Wt 252 lb 3.2 oz (114.4 kg)   LMP 04/27/2016 (Approximate)   BMI 40.71 kg/m     General appearance: alert, cooperative and appears stated age Lymph:  no inguinal LAD noted  Pelvic: External genitalia:  small area of firmness c/w improving skin furuncle, no fluctuance              Urethra:  normal appearing urethra with no masses, tenderness or lesions              Bartholins and Skenes: normal                 Vagina: normal appearing vagina with normal color and discharge, no lesions              Cervix: absent  Bimanual Exam:  Uterus:  uterus absent              Adnexa: no mass, fullness, tenderness            Chaperone, Ina Homes, CMA, was present for exam.  Assessment/Plan: 1. Vulvar lesion - area is smaller and non tender.  No additional treatment recommended.  Hot compresses and possible antibiotics could be used if needed in the future  2. Encounter for screening mammogram for malignant neoplasm of breast - MM 3D SCREEN BREAST BILATERAL; Future  3. Perimenopausal symptoms - will check hormonal levels today and see where she is in relation to menopause - Follicle stimulating hormone - Estradiol  4. Stress incontinence of urine - consider pelvic PT

## 2022-05-17 LAB — FOLLICLE STIMULATING HORMONE: FSH: 2.4 m[IU]/mL

## 2022-05-17 LAB — ESTRADIOL: Estradiol: 103 pg/mL

## 2022-05-18 DIAGNOSIS — F411 Generalized anxiety disorder: Secondary | ICD-10-CM | POA: Diagnosis not present

## 2022-05-21 DIAGNOSIS — F410 Panic disorder [episodic paroxysmal anxiety] without agoraphobia: Secondary | ICD-10-CM | POA: Diagnosis not present

## 2022-05-21 DIAGNOSIS — F321 Major depressive disorder, single episode, moderate: Secondary | ICD-10-CM | POA: Diagnosis not present

## 2022-05-25 DIAGNOSIS — F411 Generalized anxiety disorder: Secondary | ICD-10-CM | POA: Diagnosis not present

## 2022-06-04 DIAGNOSIS — F410 Panic disorder [episodic paroxysmal anxiety] without agoraphobia: Secondary | ICD-10-CM | POA: Diagnosis not present

## 2022-06-04 DIAGNOSIS — F321 Major depressive disorder, single episode, moderate: Secondary | ICD-10-CM | POA: Diagnosis not present

## 2022-06-06 DIAGNOSIS — F411 Generalized anxiety disorder: Secondary | ICD-10-CM | POA: Diagnosis not present

## 2022-06-08 ENCOUNTER — Ambulatory Visit
Admission: RE | Admit: 2022-06-08 | Discharge: 2022-06-08 | Disposition: A | Discharge status: Home / Self Care | Payer: BC Managed Care – PPO | Source: Ambulatory Visit | Attending: Obstetrics & Gynecology | Admitting: Obstetrics & Gynecology

## 2022-06-08 DIAGNOSIS — Z1231 Encounter for screening mammogram for malignant neoplasm of breast: Secondary | ICD-10-CM | POA: Diagnosis not present

## 2022-06-11 DIAGNOSIS — F410 Panic disorder [episodic paroxysmal anxiety] without agoraphobia: Secondary | ICD-10-CM | POA: Diagnosis not present

## 2022-06-11 DIAGNOSIS — F321 Major depressive disorder, single episode, moderate: Secondary | ICD-10-CM | POA: Diagnosis not present

## 2022-06-13 DIAGNOSIS — F411 Generalized anxiety disorder: Secondary | ICD-10-CM | POA: Diagnosis not present

## 2022-06-18 DIAGNOSIS — F321 Major depressive disorder, single episode, moderate: Secondary | ICD-10-CM | POA: Diagnosis not present

## 2022-06-18 DIAGNOSIS — F410 Panic disorder [episodic paroxysmal anxiety] without agoraphobia: Secondary | ICD-10-CM | POA: Diagnosis not present

## 2022-06-20 DIAGNOSIS — F411 Generalized anxiety disorder: Secondary | ICD-10-CM | POA: Diagnosis not present

## 2022-06-22 DIAGNOSIS — F4321 Adjustment disorder with depressed mood: Secondary | ICD-10-CM | POA: Diagnosis not present

## 2022-06-22 DIAGNOSIS — F332 Major depressive disorder, recurrent severe without psychotic features: Secondary | ICD-10-CM | POA: Diagnosis not present

## 2022-06-22 DIAGNOSIS — F419 Anxiety disorder, unspecified: Secondary | ICD-10-CM | POA: Diagnosis not present

## 2022-06-22 DIAGNOSIS — F411 Generalized anxiety disorder: Secondary | ICD-10-CM | POA: Diagnosis not present

## 2022-06-25 DIAGNOSIS — F321 Major depressive disorder, single episode, moderate: Secondary | ICD-10-CM | POA: Diagnosis not present

## 2022-06-25 DIAGNOSIS — F410 Panic disorder [episodic paroxysmal anxiety] without agoraphobia: Secondary | ICD-10-CM | POA: Diagnosis not present

## 2022-06-27 DIAGNOSIS — F411 Generalized anxiety disorder: Secondary | ICD-10-CM | POA: Diagnosis not present

## 2022-07-04 DIAGNOSIS — F411 Generalized anxiety disorder: Secondary | ICD-10-CM | POA: Diagnosis not present

## 2022-07-12 DIAGNOSIS — F410 Panic disorder [episodic paroxysmal anxiety] without agoraphobia: Secondary | ICD-10-CM | POA: Diagnosis not present

## 2022-07-12 DIAGNOSIS — F321 Major depressive disorder, single episode, moderate: Secondary | ICD-10-CM | POA: Diagnosis not present

## 2022-07-16 DIAGNOSIS — F410 Panic disorder [episodic paroxysmal anxiety] without agoraphobia: Secondary | ICD-10-CM | POA: Diagnosis not present

## 2022-07-16 DIAGNOSIS — F321 Major depressive disorder, single episode, moderate: Secondary | ICD-10-CM | POA: Diagnosis not present

## 2022-07-18 DIAGNOSIS — F411 Generalized anxiety disorder: Secondary | ICD-10-CM | POA: Diagnosis not present

## 2022-07-23 DIAGNOSIS — F410 Panic disorder [episodic paroxysmal anxiety] without agoraphobia: Secondary | ICD-10-CM | POA: Diagnosis not present

## 2022-07-23 DIAGNOSIS — F321 Major depressive disorder, single episode, moderate: Secondary | ICD-10-CM | POA: Diagnosis not present

## 2022-08-08 DIAGNOSIS — F411 Generalized anxiety disorder: Secondary | ICD-10-CM | POA: Diagnosis not present

## 2022-08-17 DIAGNOSIS — R41840 Attention and concentration deficit: Secondary | ICD-10-CM | POA: Diagnosis not present

## 2022-08-17 DIAGNOSIS — F419 Anxiety disorder, unspecified: Secondary | ICD-10-CM | POA: Diagnosis not present

## 2022-08-17 DIAGNOSIS — F338 Other recurrent depressive disorders: Secondary | ICD-10-CM | POA: Diagnosis not present

## 2022-08-17 DIAGNOSIS — G40802 Other epilepsy, not intractable, without status epilepticus: Secondary | ICD-10-CM | POA: Diagnosis not present

## 2022-08-20 ENCOUNTER — Ambulatory Visit: Payer: BC Managed Care – PPO | Admitting: Neurology

## 2022-08-20 ENCOUNTER — Encounter: Payer: Self-pay | Admitting: Neurology

## 2022-08-20 DIAGNOSIS — G40309 Generalized idiopathic epilepsy and epileptic syndromes, not intractable, without status epilepticus: Secondary | ICD-10-CM

## 2022-08-20 MED ORDER — ZONISAMIDE 100 MG PO CAPS: ORAL_CAPSULE | ORAL | 3 refills | Status: DC

## 2022-08-20 NOTE — Progress Notes (Signed)
NEUROLOGY FOLLOW UP OFFICE NOTE  April Campbell [id number] 1976/01/20  HISTORY OF PRESENT ILLNESS: I had the pleasure of seeing April Campbell in follow-up in the neurology clinic on 08/20/2022.  The patient was last seen a year ago for primary generalized epilepsy. Since her last visit, she continues to do well with no GTCs since 2017. She was previously having panic attacks that have quieted down. She sees her psychiatrist who prescribes Lamotrigine, currently on 150mg  1/2 tab BID. She denies any staring/unresponsive episodes, gaps in time, olfactory/gustatory hallucinations, focal numbness/tingling/weakness, myoclonic jerks. She has occasional tingling in both hands. She has weather-related headaches. No dizziness. She may be started on ADHD medication and wonders about her diagnosis of Epilepsy which we discussed today. She gets 6-7 hours of sleep, stress affects sleep quality. She lives with her husband and drives.     History on Initial Assessment 03/25/2020: This is a 46 year old right-handed woman with a history of anxiety, depression, migraines, presenting for a second opinion regarding epilepsy. Records from [facility] were reviewed. She was last seen at [facility] 2 months ago. She had a GTC in 2002 while on Wellbutrin, EEG at that time showed bifrontal spikes in drowsiness. She was started on Depakote but had side effects. She went to [facility] for a second opinion and had a normal MRI and EEG, tapered off Depakote. Seizures recurred in 2010 and she was started on Levetiracetam, however it was ineffective and she was changed to Zonisamide and Lamotrigine. She had a breakthrough seizure due to medication refill mixup in 2013 and was seizure-free until 2019 when trial of Zonisamide taper was done. She started having recurrent episodes of feeling like a seizure was coming on with a feeling of warmth and everything around her going faster. She also reported deja vu feelings. She was reporting cognitive  and word-finding issues, she felt she was overmedicated and had an EMU admission in February 2021 where Lamotrigine and Zonisamide were held. Typical events were not captured, however baseline EEG showed generalized 3 Hz polyspike and wave interictal activity. She was told that there was no need for LTG, on her follow-up in March 2021, she reported feeling very anxious, especially about not being tapered off Lamotrigine and poor communication about restarting it. She felt a "lack of support" for her symptoms. Lamotrigine was restarted by her PCP to 100mg  in AM, 150mg  in PM, but now she is on 50mg  BID. She continues on Zonisamide 500mg  qhs with no side effects. She continues to report word-finding difficulties. She is feeling a lot better with reduction in Lamotrigine dose and follows up with Psychiatry. She denies any significant falls.  She states she has had 3 or 4 GTCs in her lifetime. She denies losing consciousness with most of her seizures, she describes sensations of high energy inside but everything around is moving in slow motion, "like I can see the future." Sometimes there is a buzzing, strong, heat, strong fear, there is always an intense sensation that started in her childhood. In HS this was attributed to caffeine, then she had the first GTC in her early 44s attributed to Wellbutrin. Her last GTC was in 2017. Seizure triggers include lack of sleep/food, stress. She reports that seizures had quieted down prior to this year with a lot going on. This past months she feels she is doing better, previously she was having smaller seizures once a week, lasting for a minute. She feels tired and shaken up after, no focal  weakness. They deny any staring/unresponsive episodes, she can talk and respond. Her husband reports she twitches a lot in her sleep. She has migraines once a week and takes prn Advil and Cambia. There is occasional photophobia, no nausea/vomiting. She denies any dizziness, diplopia,  dysarthria/dysphagia, bladder dysfunction. She has neck pain, occasional tingling in both arms. She has constipation.   Prior AEDs: Topamax, Depakote, Keppra  Diagnostic Data: EMU admission Feb 1-4, 2021 at [facility]: Typical events were not captured. Baseline showed generalized 3 Hz PSW interictal activity. MRI brain in 2017 without contrast was normal.   PAST MEDICAL HISTORY: Past Medical History:  Diagnosis Date   Anxiety    Depression    Endometriosis    GERD (gastroesophageal reflux disease)    History of migraine headaches    Seizures (HCC)    controlled with meds, last seizure 4-5 yrs ago    MEDICATIONS: Current Outpatient Medications on File Prior to Visit  Medication Sig Dispense Refill   ARIPiprazole (ABILIFY) 2 MG tablet Take 2 mg by mouth daily.     famotidine (PEPCID) 20 MG tablet Take 20 mg by mouth at bedtime.     hydrOXYzine (ATARAX/VISTARIL) 10 MG tablet Take 10 mg by mouth 3 (three) times daily as needed for anxiety.     ibuprofen (ADVIL,MOTRIN) 200 MG tablet Take 800 mg by mouth as needed for headache or moderate pain.      Lactobacillus (PROBIOTIC ACIDOPHILUS PO) Take 1 capsule by mouth daily.      lamoTRIgine (LAMICTAL) 100 MG tablet Take 1/2 tab in AM, 1/2 tab in PM (Patient taking differently: Take 50 mg by mouth 2 (two) times daily. 50mg  in the 75 mg at night)     loratadine (CLARITIN) 10 MG tablet Take 10 mg by mouth daily.     Multiple Vitamins-Minerals (ONE-A-DAY WOMENS PO) Take 1 tablet by mouth daily.     sertraline (ZOLOFT) 100 MG tablet Take 200 mg by mouth at bedtime.     zonisamide (ZONEGRAN) 100 MG capsule Take 5 capsules nightly 450 capsule 3   No current facility-administered medications on file prior to visit.    ALLERGIES: Allergies  Allergen Reactions   Wellbutrin [Bupropion Hcl] Other (See Comments)    SEIZURE   Escitalopram Oxalate Other (See Comments)    Decreased sex drive   Topiramate Other (See Comments)    foggy    FAMILY  HISTORY: Family History  Problem Relation Age of Onset   Hypertension Mother    Diabetes Mother    Irritable bowel syndrome Mother    Hypertension Maternal Grandmother    Diabetes Maternal Grandmother    Other Father        bile duct cancer   Irritable bowel syndrome Maternal Aunt     SOCIAL HISTORY: Social History   Socioeconomic History   Marital status: Married    Spouse name: Not on file   Number of children: 0   Years of education: Not on file   Highest education level: Not on file  Occupational History   Occupation: in Film/video editor industry  Tobacco Use   Smoking status: Never   Smokeless tobacco: Never  Vaping Use   Vaping Use: Never used  Substance and Sexual Activity   Alcohol use: Yes    Alcohol/week: 2.0 standard drinks of alcohol    Types: 2 Standard drinks or equivalent per week   Drug use: No   Sexual activity: Yes    Partners: Male  Birth control/protection: Surgical    Comment: husband vasectomy/ Hysterectomy  Other Topics Concern   Not on file  Social History Narrative   Right handed    Lives with husband    Social Determinants of Health   Financial Resource Strain: Not on file  Food Insecurity: Not on file  Transportation Needs: Not on file  Physical Activity: Not on file  Stress: Not on file  Social Connections: Not on file  Intimate Partner Violence: Not on file     PHYSICAL EXAM: Vitals:   08/20/22 1505  BP: 114/76  Pulse: 93  SpO2: 96%   General: No acute distress Head:  Normocephalic/atraumatic Skin/Extremities: No rash, no edema Neurological Exam: alert and awake. No aphasia or dysarthria. Fund of knowledge is appropriate. Attention and concentration are normal.   Cranial nerves: Pupils equal, round. Extraocular movements intact with no nystagmus. Visual fields full.  No facial asymmetry.  Motor: Bulk and tone normal, muscle strength 5/5 throughout with no pronator drift.   Finger to nose testing intact.  Gait  narrow-based and steady, able to tandem walk adequately.  Romberg negative.   IMPRESSION: This is a pleasant 46 yo RH woman with primary generalized epilepsy. EEG showed 3 Hz polyspike and wave discharges, MRI brain normal. She had significant worsening of depression and anxiety with sudden discontinuation of Lamotrigine during EMU admission in 2021, symptoms improved with restarting Lamotrigine. No GTCs since 2017, panic attacks improved, she continues to follow-up with Psychiatry and takes Lamotrigine 150mg  1/2 tab BID, in addition to Zonisamide 500mg  qhs, refills sent. We discussed her diagnosis, she may be starting ADHD medication and wonders about Epilepsy. We discussed that if ADHD symptoms do not improve with medication,w e can do a 48-hour ambulatory EEG to assess for subclinical seizures. She is aware of [place] driving laws to stop driving after a seizure until 6 months seizure-free. Follow-up in 1 year, call for any changes.  Thank you for allowing me to participate in her care.  Please do not hesitate to call for any questions or concerns.    Ellouise Newer, M.D.   CC: Dr. Radene Ou

## 2022-08-20 NOTE — Patient Instructions (Signed)
Good to see you!  Continue Zonisamide 500mg  every night, Lamotrigine as prescribed by your psychiatrist  2. See how you feel with the ADHD medication. If no improvement in symptoms, we can do a prolonged home EEG to see if silent seizures are contributing to your symptoms  3. Follow-up in 1 year, call for any changes   Seizure Precautions: 1. If medication has been prescribed for you to prevent seizures, take it exactly as directed.  Do not stop taking the medicine without talking to your doctor first, even if you have not had a seizure in a long time.   2. Avoid activities in which a seizure would cause danger to yourself or to others.  Don't operate dangerous machinery, swim alone, or climb in high or dangerous places, such as on ladders, roofs, or girders.  Do not drive unless your doctor says you may.  3. If you have any warning that you may have a seizure, lay down in a safe place where you can't hurt yourself.    4.  No driving for 6 months from last seizure, as per [facility].   Please refer to the following link on the [facility] website for more information: [email or web address]   5.  Maintain good sleep hygiene. Avoid alcohol.  6.  Notify your neurology if you are planning pregnancy or if you become pregnant.  7.  Contact your doctor if you have any problems that may be related to the medicine you are taking.  8.  Call 911 and bring the patient back to the ED if:        A.  The seizure lasts longer than 5 minutes.       B.  The patient doesn't awaken shortly after the seizure  C.  The patient has new problems such as difficulty seeing, speaking or moving  D.  The patient was injured during the seizure  E.  The patient has a temperature over 102 F (39C)  F.  The patient vomited and now is having trouble breathing

## 2022-08-21 DIAGNOSIS — R41840 Attention and concentration deficit: Secondary | ICD-10-CM | POA: Diagnosis not present

## 2022-08-24 DIAGNOSIS — F410 Panic disorder [episodic paroxysmal anxiety] without agoraphobia: Secondary | ICD-10-CM | POA: Diagnosis not present

## 2022-08-24 DIAGNOSIS — F900 Attention-deficit hyperactivity disorder, predominantly inattentive type: Secondary | ICD-10-CM | POA: Diagnosis not present

## 2022-09-03 DIAGNOSIS — F410 Panic disorder [episodic paroxysmal anxiety] without agoraphobia: Secondary | ICD-10-CM | POA: Diagnosis not present

## 2022-09-03 DIAGNOSIS — F900 Attention-deficit hyperactivity disorder, predominantly inattentive type: Secondary | ICD-10-CM | POA: Diagnosis not present

## 2022-09-05 DIAGNOSIS — F411 Generalized anxiety disorder: Secondary | ICD-10-CM | POA: Diagnosis not present

## 2022-09-14 DIAGNOSIS — Z79899 Other long term (current) drug therapy: Secondary | ICD-10-CM | POA: Diagnosis not present

## 2022-09-14 DIAGNOSIS — F332 Major depressive disorder, recurrent severe without psychotic features: Secondary | ICD-10-CM | POA: Diagnosis not present

## 2022-09-14 DIAGNOSIS — F411 Generalized anxiety disorder: Secondary | ICD-10-CM | POA: Diagnosis not present

## 2022-09-24 DIAGNOSIS — R41840 Attention and concentration deficit: Secondary | ICD-10-CM | POA: Diagnosis not present

## 2022-09-25 DIAGNOSIS — F902 Attention-deficit hyperactivity disorder, combined type: Secondary | ICD-10-CM | POA: Diagnosis not present

## 2022-09-25 DIAGNOSIS — Z79899 Other long term (current) drug therapy: Secondary | ICD-10-CM | POA: Diagnosis not present

## 2022-09-28 DIAGNOSIS — F410 Panic disorder [episodic paroxysmal anxiety] without agoraphobia: Secondary | ICD-10-CM | POA: Diagnosis not present

## 2022-09-28 DIAGNOSIS — F900 Attention-deficit hyperactivity disorder, predominantly inattentive type: Secondary | ICD-10-CM | POA: Diagnosis not present

## 2022-10-02 DIAGNOSIS — F902 Attention-deficit hyperactivity disorder, combined type: Secondary | ICD-10-CM | POA: Diagnosis not present

## 2022-10-03 DIAGNOSIS — F411 Generalized anxiety disorder: Secondary | ICD-10-CM | POA: Diagnosis not present

## 2022-10-04 DIAGNOSIS — F900 Attention-deficit hyperactivity disorder, predominantly inattentive type: Secondary | ICD-10-CM | POA: Diagnosis not present

## 2022-10-04 DIAGNOSIS — F410 Panic disorder [episodic paroxysmal anxiety] without agoraphobia: Secondary | ICD-10-CM | POA: Diagnosis not present

## 2022-10-17 DIAGNOSIS — F410 Panic disorder [episodic paroxysmal anxiety] without agoraphobia: Secondary | ICD-10-CM | POA: Diagnosis not present

## 2022-10-17 DIAGNOSIS — F900 Attention-deficit hyperactivity disorder, predominantly inattentive type: Secondary | ICD-10-CM | POA: Diagnosis not present

## 2022-10-30 DIAGNOSIS — M545 Low back pain, unspecified: Secondary | ICD-10-CM | POA: Diagnosis not present

## 2022-10-31 DIAGNOSIS — F411 Generalized anxiety disorder: Secondary | ICD-10-CM | POA: Diagnosis not present

## 2022-11-02 ENCOUNTER — Ambulatory Visit
Admission: RE | Admit: 2022-11-02 | Discharge: 2022-11-02 | Disposition: A | Discharge status: Home / Self Care | Payer: BC Managed Care – PPO | Source: Ambulatory Visit | Attending: Family Medicine | Admitting: Family Medicine

## 2022-11-02 ENCOUNTER — Other Ambulatory Visit: Payer: Self-pay | Admitting: Family Medicine

## 2022-11-02 DIAGNOSIS — M545 Low back pain, unspecified: Secondary | ICD-10-CM

## 2022-11-15 DIAGNOSIS — F410 Panic disorder [episodic paroxysmal anxiety] without agoraphobia: Secondary | ICD-10-CM | POA: Diagnosis not present

## 2022-11-15 DIAGNOSIS — F900 Attention-deficit hyperactivity disorder, predominantly inattentive type: Secondary | ICD-10-CM | POA: Diagnosis not present

## 2022-11-26 DIAGNOSIS — F900 Attention-deficit hyperactivity disorder, predominantly inattentive type: Secondary | ICD-10-CM | POA: Diagnosis not present

## 2022-11-26 DIAGNOSIS — F410 Panic disorder [episodic paroxysmal anxiety] without agoraphobia: Secondary | ICD-10-CM | POA: Diagnosis not present

## 2022-11-28 DIAGNOSIS — F411 Generalized anxiety disorder: Secondary | ICD-10-CM | POA: Diagnosis not present

## 2022-12-13 DIAGNOSIS — F900 Attention-deficit hyperactivity disorder, predominantly inattentive type: Secondary | ICD-10-CM | POA: Diagnosis not present

## 2022-12-13 DIAGNOSIS — F410 Panic disorder [episodic paroxysmal anxiety] without agoraphobia: Secondary | ICD-10-CM | POA: Diagnosis not present

## 2022-12-17 DIAGNOSIS — F902 Attention-deficit hyperactivity disorder, combined type: Secondary | ICD-10-CM | POA: Diagnosis not present

## 2022-12-17 DIAGNOSIS — Z79899 Other long term (current) drug therapy: Secondary | ICD-10-CM | POA: Diagnosis not present

## 2022-12-19 DIAGNOSIS — F410 Panic disorder [episodic paroxysmal anxiety] without agoraphobia: Secondary | ICD-10-CM | POA: Diagnosis not present

## 2022-12-19 DIAGNOSIS — F900 Attention-deficit hyperactivity disorder, predominantly inattentive type: Secondary | ICD-10-CM | POA: Diagnosis not present

## 2022-12-26 DIAGNOSIS — F331 Major depressive disorder, recurrent, moderate: Secondary | ICD-10-CM | POA: Diagnosis not present

## 2022-12-26 DIAGNOSIS — F411 Generalized anxiety disorder: Secondary | ICD-10-CM | POA: Diagnosis not present

## 2022-12-31 DIAGNOSIS — L700 Acne vulgaris: Secondary | ICD-10-CM | POA: Diagnosis not present

## 2022-12-31 DIAGNOSIS — F410 Panic disorder [episodic paroxysmal anxiety] without agoraphobia: Secondary | ICD-10-CM | POA: Diagnosis not present

## 2022-12-31 DIAGNOSIS — F900 Attention-deficit hyperactivity disorder, predominantly inattentive type: Secondary | ICD-10-CM | POA: Diagnosis not present

## 2023-01-09 DIAGNOSIS — F411 Generalized anxiety disorder: Secondary | ICD-10-CM | POA: Diagnosis not present

## 2023-01-15 DIAGNOSIS — F3341 Major depressive disorder, recurrent, in partial remission: Secondary | ICD-10-CM | POA: Diagnosis not present

## 2023-01-15 DIAGNOSIS — F411 Generalized anxiety disorder: Secondary | ICD-10-CM | POA: Diagnosis not present

## 2023-01-16 DIAGNOSIS — F410 Panic disorder [episodic paroxysmal anxiety] without agoraphobia: Secondary | ICD-10-CM | POA: Diagnosis not present

## 2023-01-16 DIAGNOSIS — F900 Attention-deficit hyperactivity disorder, predominantly inattentive type: Secondary | ICD-10-CM | POA: Diagnosis not present

## 2023-01-24 DIAGNOSIS — F900 Attention-deficit hyperactivity disorder, predominantly inattentive type: Secondary | ICD-10-CM | POA: Diagnosis not present

## 2023-01-24 DIAGNOSIS — F410 Panic disorder [episodic paroxysmal anxiety] without agoraphobia: Secondary | ICD-10-CM | POA: Diagnosis not present

## 2023-02-07 DIAGNOSIS — F900 Attention-deficit hyperactivity disorder, predominantly inattentive type: Secondary | ICD-10-CM | POA: Diagnosis not present

## 2023-02-07 DIAGNOSIS — F410 Panic disorder [episodic paroxysmal anxiety] without agoraphobia: Secondary | ICD-10-CM | POA: Diagnosis not present

## 2023-02-13 DIAGNOSIS — F411 Generalized anxiety disorder: Secondary | ICD-10-CM | POA: Diagnosis not present

## 2023-02-15 DIAGNOSIS — F410 Panic disorder [episodic paroxysmal anxiety] without agoraphobia: Secondary | ICD-10-CM | POA: Diagnosis not present

## 2023-02-15 DIAGNOSIS — F900 Attention-deficit hyperactivity disorder, predominantly inattentive type: Secondary | ICD-10-CM | POA: Diagnosis not present

## 2023-03-01 DIAGNOSIS — F410 Panic disorder [episodic paroxysmal anxiety] without agoraphobia: Secondary | ICD-10-CM | POA: Diagnosis not present

## 2023-03-01 DIAGNOSIS — F900 Attention-deficit hyperactivity disorder, predominantly inattentive type: Secondary | ICD-10-CM | POA: Diagnosis not present

## 2023-03-05 DIAGNOSIS — F411 Generalized anxiety disorder: Secondary | ICD-10-CM | POA: Diagnosis not present

## 2023-03-08 DIAGNOSIS — F900 Attention-deficit hyperactivity disorder, predominantly inattentive type: Secondary | ICD-10-CM | POA: Diagnosis not present

## 2023-03-08 DIAGNOSIS — F410 Panic disorder [episodic paroxysmal anxiety] without agoraphobia: Secondary | ICD-10-CM | POA: Diagnosis not present

## 2023-03-15 DIAGNOSIS — F900 Attention-deficit hyperactivity disorder, predominantly inattentive type: Secondary | ICD-10-CM | POA: Diagnosis not present

## 2023-03-15 DIAGNOSIS — F410 Panic disorder [episodic paroxysmal anxiety] without agoraphobia: Secondary | ICD-10-CM | POA: Diagnosis not present

## 2023-03-18 DIAGNOSIS — F410 Panic disorder [episodic paroxysmal anxiety] without agoraphobia: Secondary | ICD-10-CM | POA: Diagnosis not present

## 2023-03-18 DIAGNOSIS — Z79899 Other long term (current) drug therapy: Secondary | ICD-10-CM | POA: Diagnosis not present

## 2023-03-18 DIAGNOSIS — F900 Attention-deficit hyperactivity disorder, predominantly inattentive type: Secondary | ICD-10-CM | POA: Diagnosis not present

## 2023-03-18 DIAGNOSIS — F902 Attention-deficit hyperactivity disorder, combined type: Secondary | ICD-10-CM | POA: Diagnosis not present

## 2023-03-19 DIAGNOSIS — F902 Attention-deficit hyperactivity disorder, combined type: Secondary | ICD-10-CM | POA: Diagnosis not present

## 2023-03-26 DIAGNOSIS — F411 Generalized anxiety disorder: Secondary | ICD-10-CM | POA: Diagnosis not present

## 2023-04-02 DIAGNOSIS — F900 Attention-deficit hyperactivity disorder, predominantly inattentive type: Secondary | ICD-10-CM | POA: Diagnosis not present

## 2023-04-02 DIAGNOSIS — F410 Panic disorder [episodic paroxysmal anxiety] without agoraphobia: Secondary | ICD-10-CM | POA: Diagnosis not present

## 2023-04-10 DIAGNOSIS — F900 Attention-deficit hyperactivity disorder, predominantly inattentive type: Secondary | ICD-10-CM | POA: Diagnosis not present

## 2023-04-10 DIAGNOSIS — F331 Major depressive disorder, recurrent, moderate: Secondary | ICD-10-CM | POA: Diagnosis not present

## 2023-04-16 DIAGNOSIS — F900 Attention-deficit hyperactivity disorder, predominantly inattentive type: Secondary | ICD-10-CM | POA: Diagnosis not present

## 2023-04-16 DIAGNOSIS — F331 Major depressive disorder, recurrent, moderate: Secondary | ICD-10-CM | POA: Diagnosis not present

## 2023-04-23 DIAGNOSIS — F331 Major depressive disorder, recurrent, moderate: Secondary | ICD-10-CM | POA: Diagnosis not present

## 2023-04-23 DIAGNOSIS — F900 Attention-deficit hyperactivity disorder, predominantly inattentive type: Secondary | ICD-10-CM | POA: Diagnosis not present

## 2023-04-24 DIAGNOSIS — F411 Generalized anxiety disorder: Secondary | ICD-10-CM | POA: Diagnosis not present

## 2023-04-30 DIAGNOSIS — H00012 Hordeolum externum right lower eyelid: Secondary | ICD-10-CM | POA: Diagnosis not present

## 2023-05-09 DIAGNOSIS — F900 Attention-deficit hyperactivity disorder, predominantly inattentive type: Secondary | ICD-10-CM | POA: Diagnosis not present

## 2023-05-09 DIAGNOSIS — F331 Major depressive disorder, recurrent, moderate: Secondary | ICD-10-CM | POA: Diagnosis not present

## 2023-05-14 DIAGNOSIS — F411 Generalized anxiety disorder: Secondary | ICD-10-CM | POA: Diagnosis not present

## 2023-05-23 DIAGNOSIS — F331 Major depressive disorder, recurrent, moderate: Secondary | ICD-10-CM | POA: Diagnosis not present

## 2023-05-23 DIAGNOSIS — F900 Attention-deficit hyperactivity disorder, predominantly inattentive type: Secondary | ICD-10-CM | POA: Diagnosis not present

## 2023-05-28 DIAGNOSIS — F411 Generalized anxiety disorder: Secondary | ICD-10-CM | POA: Diagnosis not present

## 2023-06-11 DIAGNOSIS — F3342 Major depressive disorder, recurrent, in full remission: Secondary | ICD-10-CM | POA: Diagnosis not present

## 2023-06-11 DIAGNOSIS — F411 Generalized anxiety disorder: Secondary | ICD-10-CM | POA: Diagnosis not present

## 2023-06-12 DIAGNOSIS — F900 Attention-deficit hyperactivity disorder, predominantly inattentive type: Secondary | ICD-10-CM | POA: Diagnosis not present

## 2023-06-12 DIAGNOSIS — F331 Major depressive disorder, recurrent, moderate: Secondary | ICD-10-CM | POA: Diagnosis not present

## 2023-06-18 DIAGNOSIS — Z79899 Other long term (current) drug therapy: Secondary | ICD-10-CM | POA: Diagnosis not present

## 2023-06-18 DIAGNOSIS — F902 Attention-deficit hyperactivity disorder, combined type: Secondary | ICD-10-CM | POA: Diagnosis not present

## 2023-06-19 DIAGNOSIS — F331 Major depressive disorder, recurrent, moderate: Secondary | ICD-10-CM | POA: Diagnosis not present

## 2023-06-19 DIAGNOSIS — F900 Attention-deficit hyperactivity disorder, predominantly inattentive type: Secondary | ICD-10-CM | POA: Diagnosis not present

## 2023-06-20 DIAGNOSIS — F411 Generalized anxiety disorder: Secondary | ICD-10-CM | POA: Diagnosis not present

## 2023-06-26 DIAGNOSIS — F900 Attention-deficit hyperactivity disorder, predominantly inattentive type: Secondary | ICD-10-CM | POA: Diagnosis not present

## 2023-06-26 DIAGNOSIS — F331 Major depressive disorder, recurrent, moderate: Secondary | ICD-10-CM | POA: Diagnosis not present

## 2023-07-09 DIAGNOSIS — F411 Generalized anxiety disorder: Secondary | ICD-10-CM | POA: Diagnosis not present

## 2023-07-25 DIAGNOSIS — F900 Attention-deficit hyperactivity disorder, predominantly inattentive type: Secondary | ICD-10-CM | POA: Diagnosis not present

## 2023-07-25 DIAGNOSIS — F331 Major depressive disorder, recurrent, moderate: Secondary | ICD-10-CM | POA: Diagnosis not present

## 2023-08-01 DIAGNOSIS — F331 Major depressive disorder, recurrent, moderate: Secondary | ICD-10-CM | POA: Diagnosis not present

## 2023-08-01 DIAGNOSIS — F900 Attention-deficit hyperactivity disorder, predominantly inattentive type: Secondary | ICD-10-CM | POA: Diagnosis not present

## 2023-08-05 DIAGNOSIS — J329 Chronic sinusitis, unspecified: Secondary | ICD-10-CM | POA: Diagnosis not present

## 2023-08-06 DIAGNOSIS — F331 Major depressive disorder, recurrent, moderate: Secondary | ICD-10-CM | POA: Diagnosis not present

## 2023-08-06 DIAGNOSIS — F900 Attention-deficit hyperactivity disorder, predominantly inattentive type: Secondary | ICD-10-CM | POA: Diagnosis not present

## 2023-08-08 DIAGNOSIS — F411 Generalized anxiety disorder: Secondary | ICD-10-CM | POA: Diagnosis not present

## 2023-08-13 ENCOUNTER — Encounter: Payer: Self-pay | Admitting: Neurology

## 2023-08-13 ENCOUNTER — Ambulatory Visit: Payer: BC Managed Care – PPO | Admitting: Neurology

## 2023-08-13 DIAGNOSIS — G40309 Generalized idiopathic epilepsy and epileptic syndromes, not intractable, without status epilepticus: Secondary | ICD-10-CM

## 2023-08-13 MED ORDER — ZONISAMIDE 100 MG PO CAPS: ORAL_CAPSULE | ORAL | 3 refills | Status: DC

## 2023-08-13 NOTE — Progress Notes (Signed)
NEUROLOGY FOLLOW UP OFFICE NOTE  April Campbell [id number] Oct 19, 1976  HISTORY OF PRESENT ILLNESS: I had the pleasure of seeing April Campbell in follow-up in the neurology clinic on 08/13/2023.  The patient was last seen a year ago for Primary Generalized Epilepsy. She is alone in the office today. Records and images were personally reviewed where available.  Since her last visit, she continues to do well with no GTCs since 2017. She was previously having panic attacks that have quieted down. She is on Zonisamide 500mg  at bedtime and Lamotrigine 100mg : 1/2 tab BID. She sees her psychiatrist who prescribes Lamotrigine. She reports this has been a good year, she denies any staring/unresponsive episodes, gaps in time, olfactory/gustatory hallucinations, focal numbness/tingling/weakness, myoclonic jerks. No significant headaches, dizziness, no falls. Vision is getting worse, she needs new glasses. She gets 7-8 hours of sleep. Mood has gotten a lot better. She works remotely. She is driving.   History on Initial Assessment 03/25/2020: This is a 47 year old right-handed woman with a history of anxiety, depression, migraines, presenting for a second opinion regarding epilepsy. Records from [facility] were reviewed. She was last seen at [facility] 2 months ago. She had a GTC in 2002 while on Wellbutrin, EEG at that time showed bifrontal spikes in drowsiness. She was started on Depakote but had side effects. She went to [facility] for a second opinion and had a normal MRI and EEG, tapered off Depakote. Seizures recurred in 2010 and she was started on Levetiracetam, however it was ineffective and she was changed to Zonisamide and Lamotrigine. She had a breakthrough seizure due to medication refill mixup in 2013 and was seizure-free until 2019 when trial of Zonisamide taper was done. She started having recurrent episodes of feeling like a seizure was coming on with a feeling of warmth and everything around her going faster.  She also reported deja vu feelings. She was reporting cognitive and word-finding issues, she felt she was overmedicated and had an EMU admission in February 2021 where Lamotrigine and Zonisamide were held. Typical events were not captured, however baseline EEG showed generalized 3 Hz polyspike and wave interictal activity. She was told that there was no need for LTG, on her follow-up in March 2021, she reported feeling very anxious, especially about not being tapered off Lamotrigine and poor communication about restarting it. She felt a "lack of support" for her symptoms. Lamotrigine was restarted by her PCP to 100mg  in AM, 150mg  in PM, but now she is on 50mg  BID. She continues on Zonisamide 500mg  qhs with no side effects. She continues to report word-finding difficulties. She is feeling a lot better with reduction in Lamotrigine dose and follows up with Psychiatry. She denies any significant falls.  She states she has had 3 or 4 GTCs in her lifetime. She denies losing consciousness with most of her seizures, she describes sensations of high energy inside but everything around is moving in slow motion, "like I can see the future." Sometimes there is a buzzing, strong, heat, strong fear, there is always an intense sensation that started in her childhood. In HS this was attributed to caffeine, then she had the first GTC in her early 12s attributed to Wellbutrin. Her last GTC was in 2017. Seizure triggers include lack of sleep/food, stress. She reports that seizures had quieted down prior to this year with a lot going on. This past months she feels she is doing better, previously she was having smaller seizures once a week,  lasting for a minute. She feels tired and shaken up after, no focal weakness. They deny any staring/unresponsive episodes, she can talk and respond. Her husband reports she twitches a lot in her sleep. She has migraines once a week and takes prn Advil and Cambia. There is occasional photophobia,  no nausea/vomiting. She denies any dizziness, diplopia, dysarthria/dysphagia, bladder dysfunction. She has neck pain, occasional tingling in both arms. She has constipation.   Prior AEDs: Topamax, Depakote, Keppra  Diagnostic Data: EMU admission Feb 1-4, 2021 at [facility]: Typical events were not captured. Baseline showed generalized 3 Hz PSW interictal activity. MRI brain in 2017 without contrast was normal.   PAST MEDICAL HISTORY: Past Medical History:  Diagnosis Date   Anxiety    Depression    Endometriosis    GERD (gastroesophageal reflux disease)    History of migraine headaches    Seizures (HCC)    controlled with meds, last seizure 45 yrs ago    MEDICATIONS: Current Outpatient Medications on File Prior to Visit  Medication Sig Dispense Refill   ARIPiprazole (ABILIFY) 2 MG tablet Take 2 mg by mouth daily.     famotidine (PEPCID) 20 MG tablet Take 20 mg by mouth at bedtime.     ibuprofen (ADVIL,MOTRIN) 200 MG tablet Take 800 mg by mouth as needed for headache or moderate pain.      Lactobacillus (PROBIOTIC ACIDOPHILUS PO) Take 1 capsule by mouth daily.      lamoTRIgine (LAMICTAL) 100 MG tablet Take 1/2 tab in AM, 1/2 tab in PM (Patient taking differently: Take 50 mg by mouth 2 (two) times daily. 50mg  in the 75 mg at night)     loratadine (CLARITIN) 10 MG tablet Take 10 mg by mouth daily.     Multiple Vitamins-Minerals (ONE-A-DAY WOMENS PO) Take 1 tablet by mouth daily.     Vilazodone HCl (VIIBRYD) 40 MG TABS Take 40 mg by mouth daily.     zonisamide (ZONEGRAN) 100 MG capsule Take 5 capsules nightly 450 capsule 3   No current facility-administered medications on file prior to visit.    ALLERGIES: Allergies  Allergen Reactions   Wellbutrin [Bupropion Hcl] Other (See Comments)    SEIZURE   Escitalopram Oxalate Other (See Comments)    Decreased sex drive   Topiramate Other (See Comments)    foggy    FAMILY HISTORY: Family History  Problem Relation Age of Onset    Hypertension Mother    Diabetes Mother    Irritable bowel syndrome Mother    Hypertension Maternal Grandmother    Diabetes Maternal Grandmother    Other Father        bile duct cancer   Irritable bowel syndrome Maternal Aunt     SOCIAL HISTORY: Social History   Socioeconomic History   Marital status: Married    Spouse name: Not on file   Number of children: 0   Years of education: Not on file   Highest education level: Not on file  Occupational History   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  Tobacco Use   Smoking status: Never   Smokeless tobacco: Never  Vaping Use   Vaping status: Never Used  Substance and Sexual Activity   Alcohol use: Yes    Alcohol/week: 2.0 standard drinks of alcohol    Types: 2 Standard drinks or equivalent per week    Comment: rare   Drug use: No   Sexual activity: Yes    Partners: Male    Birth control/protection: Surgical  Comment: husband vasectomy/ Hysterectomy  Other Topics Concern   Not on file  Social History Narrative   Right handed    Lives with husband    Social Determinants of Health   Financial Resource Strain: Not on file  Food Insecurity: Not on file  Transportation Needs: Not on file  Physical Activity: Not on file  Stress: Not on file  Social Connections: Not on file  Intimate Partner Violence: Not on file     PHYSICAL EXAM: Vitals:   08/13/23 1453  BP: 114/72  Pulse: (!) 57  SpO2: 95%   General: No acute distress Head:  Normocephalic/atraumatic Skin/Extremities: No rash, no edema Neurological Exam: alert and  awake. No aphasia or dysarthria. Fund of knowledge is appropriate.  Attention and concentration are normal.   Cranial nerves: Pupils equal, round. Extraocular movements intact with no nystagmus. Visual fields full.  No facial asymmetry.  Motor: Bulk and tone normal, muscle strength 5/5 throughout with no pronator drift.   Finger to nose testing intact.  Gait narrow-based and steady, able to  tandem walk adequately.  Romberg negative.   IMPRESSION: This is a pleasant 47 yo RH woman with primary generalized epilepsy. EEG showed 3 Hz polyspike and wave discharges, MRI brain normal. She had significant worsening of depression and anxiety with sudden discontinuation of Lamotrigine during EMU admission in 2021, symptoms improved with restarting Lamotrigine. No GTCs since 2017, mood is good. She is on Zonisamide 500mg , refills sent. Psychiatry fills her Lamotrigine, she states she is on 100mg  1/2 tab BID. She is aware of [place] driving laws to stop driving after a seizure until 6 months seizure-free. Follow-up in 1 year, call for any changes.    Thank you for allowing me to participate in her care.  Please do not hesitate to call for any questions or concerns.   Patrcia Dolly, M.D.   CC: Dr. Barbaraann Barthel

## 2023-08-13 NOTE — Patient Instructions (Signed)
Good to see you doing well! Continue Zonisamide 500mg  every night and Lamotrigine as prescribed by your psychiatrist. Follow-up in 1 year, call for any changes.    Seizure Precautions: 1. If medication has been prescribed for you to prevent seizures, take it exactly as directed.  Do not stop taking the medicine without talking to your doctor first, even if you have not had a seizure in a long time.   2. Avoid activities in which a seizure would cause danger to yourself or to others.  Don't operate dangerous machinery, swim alone, or climb in high or dangerous places, such as on ladders, roofs, or girders.  Do not drive unless your doctor says you may.  3. If you have any warning that you may have a seizure, lay down in a safe place where you can't hurt yourself.    4.  No driving for 6 months from last seizure, as per [facility].   Please refer to the following link on the Epile[employer] of [employer]'s website for more information: http://www.epilepsyfoundation.org/answerplace/Social/driving/drivingu.cfm   5.  Maintain good sleep hygiene. Avoid alcohol.   6.  Notify your neurology if you are planning pregnancy or if you become pregnant.  7.  Contact your doctor if you have any problems that may be related to the medicine you are taking.  8.  Call 911 and bring the patient back to the ED if:        A.  The seizure lasts longer than 5 minutes.       B.  The patient doesn't awaken shortly after the seizure  C.  The patient has new problems such as difficulty seeing, speaking or moving  D.  The patient was injured during the seizure  E.  The patient has a temperature over 102 F (39C)  F.  The patient vomited and now is having trouble breathing

## 2023-08-14 DIAGNOSIS — F411 Generalized anxiety disorder: Secondary | ICD-10-CM | POA: Diagnosis not present

## 2023-08-14 DIAGNOSIS — F3342 Major depressive disorder, recurrent, in full remission: Secondary | ICD-10-CM | POA: Diagnosis not present

## 2023-08-20 DIAGNOSIS — F331 Major depressive disorder, recurrent, moderate: Secondary | ICD-10-CM | POA: Diagnosis not present

## 2023-08-20 DIAGNOSIS — F900 Attention-deficit hyperactivity disorder, predominantly inattentive type: Secondary | ICD-10-CM | POA: Diagnosis not present

## 2023-08-31 ENCOUNTER — Other Ambulatory Visit (HOSPITAL_BASED_OUTPATIENT_CLINIC_OR_DEPARTMENT_OTHER): Payer: Self-pay | Admitting: Obstetrics & Gynecology

## 2023-08-31 ENCOUNTER — Encounter (HOSPITAL_BASED_OUTPATIENT_CLINIC_OR_DEPARTMENT_OTHER): Payer: Self-pay | Admitting: Obstetrics & Gynecology

## 2023-08-31 DIAGNOSIS — Z1231 Encounter for screening mammogram for malignant neoplasm of breast: Secondary | ICD-10-CM

## 2023-09-03 ENCOUNTER — Other Ambulatory Visit: Payer: Self-pay | Admitting: Obstetrics & Gynecology

## 2023-09-03 DIAGNOSIS — Z1231 Encounter for screening mammogram for malignant neoplasm of breast: Secondary | ICD-10-CM

## 2023-09-04 DIAGNOSIS — F331 Major depressive disorder, recurrent, moderate: Secondary | ICD-10-CM | POA: Diagnosis not present

## 2023-09-04 DIAGNOSIS — F900 Attention-deficit hyperactivity disorder, predominantly inattentive type: Secondary | ICD-10-CM | POA: Diagnosis not present

## 2023-09-17 DIAGNOSIS — Z1231 Encounter for screening mammogram for malignant neoplasm of breast: Secondary | ICD-10-CM

## 2023-09-17 DIAGNOSIS — F411 Generalized anxiety disorder: Secondary | ICD-10-CM | POA: Diagnosis not present

## 2023-09-23 DIAGNOSIS — F331 Major depressive disorder, recurrent, moderate: Secondary | ICD-10-CM | POA: Diagnosis not present

## 2023-09-23 DIAGNOSIS — F900 Attention-deficit hyperactivity disorder, predominantly inattentive type: Secondary | ICD-10-CM | POA: Diagnosis not present

## 2023-09-30 DIAGNOSIS — F411 Generalized anxiety disorder: Secondary | ICD-10-CM | POA: Diagnosis not present

## 2023-10-03 ENCOUNTER — Encounter: Payer: Self-pay | Admitting: Family Medicine

## 2023-10-03 DIAGNOSIS — R109 Unspecified abdominal pain: Secondary | ICD-10-CM | POA: Diagnosis not present

## 2023-10-03 DIAGNOSIS — F900 Attention-deficit hyperactivity disorder, predominantly inattentive type: Secondary | ICD-10-CM | POA: Diagnosis not present

## 2023-10-03 DIAGNOSIS — F331 Major depressive disorder, recurrent, moderate: Secondary | ICD-10-CM | POA: Diagnosis not present

## 2023-10-04 ENCOUNTER — Other Ambulatory Visit: Payer: Self-pay | Admitting: Family Medicine

## 2023-10-04 DIAGNOSIS — R109 Unspecified abdominal pain: Secondary | ICD-10-CM

## 2023-10-08 DIAGNOSIS — F331 Major depressive disorder, recurrent, moderate: Secondary | ICD-10-CM | POA: Diagnosis not present

## 2023-10-08 DIAGNOSIS — F900 Attention-deficit hyperactivity disorder, predominantly inattentive type: Secondary | ICD-10-CM | POA: Diagnosis not present

## 2023-10-09 ENCOUNTER — Ambulatory Visit
Admission: RE | Admit: 2023-10-09 | Discharge: 2023-10-09 | Disposition: A | Discharge status: Home / Self Care | Payer: BC Managed Care – PPO | Source: Ambulatory Visit | Attending: Obstetrics & Gynecology | Admitting: Obstetrics & Gynecology

## 2023-10-09 DIAGNOSIS — Z1231 Encounter for screening mammogram for malignant neoplasm of breast: Secondary | ICD-10-CM

## 2023-10-14 DIAGNOSIS — F331 Major depressive disorder, recurrent, moderate: Secondary | ICD-10-CM | POA: Diagnosis not present

## 2023-10-14 DIAGNOSIS — F900 Attention-deficit hyperactivity disorder, predominantly inattentive type: Secondary | ICD-10-CM | POA: Diagnosis not present

## 2023-10-15 ENCOUNTER — Inpatient Hospital Stay
Admission: RE | Admit: 2023-10-15 | Discharge: 2023-10-15 | Discharge status: Home / Self Care | Payer: BC Managed Care – PPO | Source: Ambulatory Visit | Attending: Family Medicine | Admitting: Family Medicine

## 2023-10-15 DIAGNOSIS — R109 Unspecified abdominal pain: Secondary | ICD-10-CM

## 2023-10-15 MED ORDER — IOPAMIDOL (ISOVUE-370) INJECTION 76%
80.0000 mL | Freq: Once | INTRAVENOUS | Status: AC | PRN
  Administered 2023-10-15: 80 mL via INTRAVENOUS

## 2024-01-22 ENCOUNTER — Ambulatory Visit (HOSPITAL_BASED_OUTPATIENT_CLINIC_OR_DEPARTMENT_OTHER): Admitting: Obstetrics & Gynecology

## 2024-01-22 ENCOUNTER — Encounter (HOSPITAL_BASED_OUTPATIENT_CLINIC_OR_DEPARTMENT_OTHER): Payer: Self-pay | Admitting: Obstetrics & Gynecology

## 2024-01-22 ENCOUNTER — Other Ambulatory Visit (HOSPITAL_COMMUNITY)
Admission: RE | Admit: 2024-01-22 | Discharge: 2024-01-22 | Disposition: A | Discharge status: Home / Self Care | Source: Ambulatory Visit | Attending: Obstetrics & Gynecology | Admitting: Obstetrics & Gynecology

## 2024-01-22 VITALS — BP 142/64 | HR 86 | Ht 66.0 in | Wt 237.8 lb

## 2024-01-22 DIAGNOSIS — R102 Pelvic and perineal pain: Secondary | ICD-10-CM | POA: Diagnosis present

## 2024-01-22 DIAGNOSIS — R6889 Other general symptoms and signs: Secondary | ICD-10-CM | POA: Diagnosis not present

## 2024-01-22 DIAGNOSIS — M6289 Other specified disorders of muscle: Secondary | ICD-10-CM

## 2024-01-22 DIAGNOSIS — N941 Unspecified dyspareunia: Secondary | ICD-10-CM | POA: Diagnosis not present

## 2024-01-22 DIAGNOSIS — N898 Other specified noninflammatory disorders of vagina: Secondary | ICD-10-CM | POA: Insufficient documentation

## 2024-01-22 LAB — CERVICOVAGINAL ANCILLARY ONLY
Bacterial Vaginitis (gardnerella): POSITIVE — AB
Candida Glabrata: NEGATIVE
Candida Vaginitis: NEGATIVE
Comment: NEGATIVE
Comment: NEGATIVE
Comment: NEGATIVE

## 2024-01-22 MED ORDER — ESTRADIOL 7.5 MCG/24HR VA RING: 1.0000 | VAGINAL_RING | VAGINAL | 3 refills | Status: DC

## 2024-01-22 MED ORDER — CYCLOBENZAPRINE HCL 10 MG PO TABS: 10.0000 mg | ORAL_TABLET | Freq: Every evening | ORAL | 0 refills | Status: DC | PRN

## 2024-01-22 NOTE — Progress Notes (Signed)
 GYNECOLOGY  VISIT  CC:   painful intercourse  HPI: 48 y.o. G0P0000 Married White or Caucasian female here for complaint of worsening pelvic pain/pain with intercourse.  She had prior hysterectomy with pelvic pain around and after the time of surgery.  She did undergo PT and this really helped.  Is having pain primarily with intercourse.  Does have some general dryness and does feel dry with intercourse.  Denies vaginal bleeding.  Has been having some LUQ pain as well.  Had CT in December with stool present.  Reviewed results and discussed findings.  Has been doing more with fiber and this has helped.  Does not have hot flashes but does have cold intolerance.  Describes needed to get in blankets like a cocoon last night.  She did have FSH and estradiol in 04/2022 and these were normal.   Past Medical History:  Diagnosis Date   Anxiety    Depression    Endometriosis    GERD (gastroesophageal reflux disease)    History of migraine headaches    Seizures (HCC)    controlled with meds, last seizure 4-5 yrs ago    MEDS:   Current Outpatient Medications on File Prior to Visit  Medication Sig Dispense Refill   ARIPiprazole (ABILIFY) 2 MG tablet Take 2 mg by mouth daily.     famotidine (PEPCID) 20 MG tablet Take 20 mg by mouth at bedtime.     ibuprofen (ADVIL,MOTRIN) 200 MG tablet Take 800 mg by mouth as needed for headache or moderate pain.      Lactobacillus (PROBIOTIC ACIDOPHILUS PO) Take 1 capsule by mouth daily.      lamoTRIgine (LAMICTAL) 100 MG tablet Take 1/2 tab in AM, 1/2 tab in PM (Patient taking differently: Take 50 mg by mouth 2 (two) times daily. 50mg  in the 75 mg at night)     Multiple Vitamins-Minerals (ONE-A-DAY WOMENS PO) Take 1 tablet by mouth daily.     Vilazodone HCl (VIIBRYD) 40 MG TABS Take 40 mg by mouth daily.     zonisamide (ZONEGRAN) 100 MG capsule Take 5 capsules nightly 450 capsule 3   No current facility-administered medications on file prior to visit.     ALLERGIES: Wellbutrin [bupropion hcl], Escitalopram oxalate, and Topiramate  SH:  married, non smoker  Review of Systems  Constitutional: Negative.   Genitourinary:        Pelvic pain/dysparuenia    PHYSICAL EXAMINATION:    BP (!) 142/64 (BP Location: Left Arm, Patient Position: Sitting, Cuff Size: Normal)   Pulse 86   Ht 5\' 6"  (1.676 m)   Wt 237 lb 12.8 oz (107.9 kg)   LMP 04/27/2016 (Approximate)   BMI 38.38 kg/m     General appearance: alert, cooperative and appears stated age Abdomen: soft, non-tender; bowel sounds normal; no masses,  no organomegaly Lymph:  no inguinal LAD noted  Pelvic: External genitalia:  no lesions              Urethra:  normal appearing urethra with no masses, tenderness or lesions              Bartholins and Skenes: normal                 Vagina:  erythematous mucosa with whitish discharge present              Cervix: absent              Bimanual Exam:  Uterus:  uterus absent, significant tenderness  of pelvic floor on the lower right side              Adnexa: no mass, fullness, tenderness               Pt declined chaperone today.  Assessment/Plan: 1. Dyspareunia, female (Primary) - discussed possible causes and treatment.  Feel some vaginal estrogen would be helpful.  Discussed options.  She doesn't think she will use a topical cream that is once or twice weekly.  Will try estring.  Savings coupon givne. - estradiol (ESTRING) 7.5 MCG/24HR vaginal ring; Place 1 each vaginally every 3 (three) months.  Dispense: 1 each; Refill: 3  2. Pelvic floor dysfunction - exam confirms tenderness on right pelvic floor.  Will refer to PT - Ambulatory referral to Physical Therapy  3. Vaginal discharge - will r/o vaginitis - Cervicovaginal ancillary only( [facility])  4. Cold intolerance - reassess for menopause - Estradiol - Follicle stimulating hormone  5. Pelvic pain

## 2024-01-23 ENCOUNTER — Other Ambulatory Visit: Payer: Self-pay

## 2024-01-23 ENCOUNTER — Ambulatory Visit: Attending: Obstetrics & Gynecology | Admitting: Physical Therapy

## 2024-01-23 ENCOUNTER — Encounter: Payer: Self-pay | Admitting: Physical Therapy

## 2024-01-23 DIAGNOSIS — M62838 Other muscle spasm: Secondary | ICD-10-CM | POA: Diagnosis present

## 2024-01-23 DIAGNOSIS — M6289 Other specified disorders of muscle: Secondary | ICD-10-CM | POA: Diagnosis not present

## 2024-01-23 DIAGNOSIS — R293 Abnormal posture: Secondary | ICD-10-CM | POA: Insufficient documentation

## 2024-01-23 DIAGNOSIS — R279 Unspecified lack of coordination: Secondary | ICD-10-CM | POA: Insufficient documentation

## 2024-01-23 DIAGNOSIS — M6281 Muscle weakness (generalized): Secondary | ICD-10-CM | POA: Insufficient documentation

## 2024-01-23 LAB — FOLLICLE STIMULATING HORMONE: FSH: 5.2 m[IU]/mL

## 2024-01-23 LAB — ESTRADIOL: Estradiol: 31.9 pg/mL

## 2024-01-23 NOTE — Therapy (Signed)
 OUTPATIENT PHYSICAL THERAPY FEMALE PELVIC EVALUATION   Patient Name: April Campbell MRN: [id number] DOB:1976/08/15, 48 y.o., female Today's Date: 01/23/2024  END OF SESSION:  PT End of Session - 01/23/24 0937     Visit Number 1    Date for PT Re-Evaluation 07/25/24    Authorization Type BCBS    PT Start Time 0850   arrival time   PT Stop Time 0930    PT Time Calculation (min) 40 min    Activity Tolerance Patient tolerated treatment well;No increased pain    Behavior During Therapy WFL for tasks assessed/performed             Past Medical History:  Diagnosis Date   Anxiety    Depression    Endometriosis    GERD (gastroesophageal reflux disease)    History of migraine headaches    Seizures (HCC)    controlled with meds, last seizure 4-5 yrs ago   Past Surgical History:  Procedure Laterality Date   CARPAL TUNNEL RELEASE Right 06/14/2015   Procedure: RIGHT CARPAL TUNNEL RELEASE ENDOSCOPIC;  Surgeon: Mack Hook, MD;  Location: [facility];  Service: Orthopedics;  Laterality: Right;   COLONOSCOPY WITH PROPOFOL N/A 02/10/2013   Procedure: COLONOSCOPY WITH PROPOFOL;  Surgeon: Charolett Bumpers, MD;  Location: [facility];  Service: Endoscopy;  Laterality: N/A;   CYSTOSCOPY N/A 05/14/2016   Procedure: CYSTOSCOPY;  Surgeon: Jerene Bears, MD;  Location: [facility];  Service: Gynecology;  Laterality: N/A;   DIAGNOSTIC LAPAROSCOPY  12/15/2009   uterosacral nerve ablation   ELBOW SURGERY Left 2012   LAPAROSCOPIC BILATERAL SALPINGECTOMY Bilateral 05/14/2016   Procedure: LAPAROSCOPIC BILATERAL SALPINGECTOMY;  Surgeon: Jerene Bears, MD;  Location: [facility];  Service: Gynecology;  Laterality: Bilateral;   LAPAROSCOPIC HYSTERECTOMY N/A 05/14/2016   Procedure: HYSTERECTOMY TOTAL LAPAROSCOPIC;  Surgeon: Jerene Bears, MD;  Location: [facility];  Service: Gynecology;  Laterality: N/A;   UMBILICAL HERNIA REPAIR     as an infant   WISDOM TOOTH EXTRACTION     YAG LASER APPLICATION   06/24/2006   vaporization of endometriosis   Patient Active Problem List   Diagnosis Date Noted   Anxiety 12/14/2016   Endometriosis 04/25/2016   PCOS (polycystic ovarian syndrome) 03/19/2016   Epilepsy with partial complex seizures (HCC) 01/21/2016   Idiopathic generalized epilepsy (HCC) 12/06/2015   Migraine without aura and responsive to treatment 12/06/2015    PCP: Rankins, Fanny Dance, MD   REFERRING PROVIDER: Jerene Bears, MD   REFERRING DIAG: M62.89 (ICD-10-CM) - Pelvic floor dysfunction  THERAPY DIAG:  Other muscle spasm  Muscle weakness (generalized)  Unspecified lack of coordination  Abnormal posture  Rationale for Evaluation and Treatment: Rehabilitation  ONSET DATE: years   SUBJECTIVE:  SUBJECTIVE STATEMENT: Has had vaginal pain for several years off and on and the past couple months has gotten worse. Pain with sex has gotten very bad.  Thinks it is more on Rt side but present on both. Is having dryness, does use lubricant but doesn't help.     PAIN:  Are you having pain? Yes NPRS scale: 10/10 Pain location: Vaginal  Pain type: sharp Pain description:  tearing    Aggravating factors: with penetration, deep penetration, sometimes sitting, stays after penetration as well (for a day) Relieving factors: nothing   PRECAUTIONS: None  RED FLAGS: None   WEIGHT BEARING RESTRICTIONS: No  FALLS:  Has patient fallen in last 6 months? 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   ACTIVITY LEVEL : low - reports wants to be more active but needs help with this  PLOF: Independent  PATIENT GOALS: to have less pain and be more active  PERTINENT HISTORY:   Anxiety, depression, hysterectomy, PCOS, endometriosis, Sexual abuse: No  BOWEL MOVEMENT: Pain with  bowel movement: No Type of bowel movement:Type (Bristol Stool Scale) 4, Frequency almost daily (prior to changes was once a week), and Strain no Fully empty rectum: Yes:   Leakage: No Pads: No Fiber supplement/laxative Yes benefiber every AM and increased water intake, sometimes takes a laxative (~1/month)  URINATION: Pain with urination: No Fully empty bladder: Yes:   Stream: Strong Urgency: No Frequency: sometimes forgets to go during the work day and will wait several hours to empty Leakage:  thinks she does a little at night while sleeping (wakes a little damp) Pads: No  INTERCOURSE:  Ability to have vaginal penetration Yes  Pain with intercourse: Initial Penetration, During Penetration, Deep Penetration, After Intercourse, and Pain Interrupts Intercourse DrynessYes  Climax: able but harder to achieve  Marinoff Scale: 3/3 Does use lubricant   PREGNANCY: Vaginal deliveries 0  C-section deliveries 0   PROLAPSE: None   OBJECTIVE:  Note: Objective measures were completed at Evaluation unless otherwise noted.  DIAGNOSTIC FINDINGS:    COGNITION: Overall cognitive status: Within functional limits for tasks assessed     SENSATION: Light touch: Appears intact  LUMBAR SPECIAL TESTS:  Bil hip drop with single leg stance noting hip instability   POSTURE: rounded shoulders, forward head, and posterior pelvic tilt   LUMBARAROM/PROM:  A/PROM A/PROM  eval  Flexion WFL  Extension WFL  Right lateral flexion WFL  Left lateral flexion WFL  Right rotation Limited by 25%  Left rotation Limited by 25%   (Blank rows = not tested)  LOWER EXTREMITY ROM:  Bil hamstrings limited by 25%  LOWER EXTREMITY MMT:  Bil hips grossly 3+/5 PALPATION:   General: tightness in bil lumbar paraspinals   Pelvic Alignment: WFL  Abdominal: [facility]                External Perineal Exam: dryness noted externally, decreased clitoral hood mobility with adhesion noted at Lt side and TTP  per pt                             Internal Pelvic Floor: tightness and TTP and trigger points throughout superficial and deep layers bil   Patient confirms identification and approves PT to assess internal pelvic floor and treatment Yes No emotional/communication barriers or cognitive limitation. Patient is motivated to learn. Patient understands and agrees with treatment goals and plan. PT explains patient will be examined in standing, sitting, and  lying down to see how their muscles and joints work. When they are ready, they will be asked to remove their underwear so PT can examine their perineum. The patient is also given the option of providing their own chaperone as one is not provided in our facility. The patient also has the right and is explained the right to defer or refuse any part of the evaluation or treatment including the internal exam. With the patient's consent, PT will use one gloved finger to gently assess the muscles of the pelvic floor, seeing how well it contracts and relaxes and if there is muscle symmetry. After, the patient will get dressed and PT and patient will discuss exam findings and plan of care. PT and patient discuss plan of care, schedule, attendance policy and HEP activities.   PELVIC MMT:   MMT eval  Vaginal 4/5 7s; 5 reps - however does have tightness noted throughout therefore difficulty to assess due to decreased mobility of tissue  Internal Anal Sphincter   External Anal Sphincter   Puborectalis   Diastasis Recti   (Blank rows = not tested)        TONE: Increased   PROLAPSE: Not seen in hooklying with cough  TODAY'S TREATMENT:                                                                                                                              DATE:   01/23/24 EVAL Examination completed, findings reviewed, pt educated on POC, HEP, and handouts on pelvic floor relaxation video and feminine moisturizers. Pt motivated to participate in PT and  agreeable to attempt recommendations.     PATIENT EDUCATION:  Education details: [id number] [place] educated: Patient Education method: Explanation, Demonstration, Tactile cues, Verbal cues, and Handouts Education comprehension: verbalized understanding, returned demonstration, verbal cues required, tactile cues required, and needs further education  HOME EXERCISE PROGRAM: [id number]  ASSESSMENT:  CLINICAL IMPRESSION: Patient is a 48 y.o. female  who was seen today for physical therapy evaluation and treatment for pelvic and internal vaginal pain with penetration, intercourse that does last one day afterward. Pt also has difficulty achieving climax, has dryness, and intermittent constipation. Pt also states she has a long period time during work days where she ignores urge to urinate and sometimes only urinates one time during the day, sometimes does wake with dampness from urine leakage in the am. Pt found to have decreased flexibility in spine and hips, decreased core and hip strength. Patient consented to internal pelvic floor assessment vaginally this date and found to have decreased strength, endurance, and coordination, tension noted throughout bil pelvic floor, with TTP and trigger points present. Pt would benefit from additional PT to further address deficits.   OBJECTIVE IMPAIRMENTS: decreased activity tolerance, decreased coordination, decreased endurance, decreased mobility, decreased strength, increased fascial restrictions, increased muscle spasms, impaired flexibility, improper body mechanics, postural dysfunction, and pain.   ACTIVITY LIMITATIONS: continence and intercourse   PARTICIPATION LIMITATIONS:  interpersonal relationship, community activity, and occupation  PERSONAL FACTORS: Past/current experiences and Time since onset of injury/illness/exacerbation are also affecting patient's functional outcome.   REHAB POTENTIAL: Good  CLINICAL DECISION MAKING:  Stable/uncomplicated  EVALUATION COMPLEXITY: Low   GOALS: Goals reviewed with patient? Yes  SHORT TERM GOALS: Target date: 02/19/24  Pt to be I with HEP.  Baseline: Goal status: INITIAL  2.  Pt to be I with relaxation techniques for decreased stress and tightness at pelvic floor for decreased pain.  Baseline:  Goal status: INITIAL  3.  Pt to be I with voiding mechanics for decreased constipation and improved bladder habits.  Baseline:  Goal status: INITIAL  4.  Pt to be I with abdominal massage for improved bowel habits.  Baseline:  Goal status: INITIAL   LONG TERM GOALS: Target date: 07/25/24  Pt to be I with advanced HEP.  Baseline:  Goal status: INITIAL  2.  Pt to report no more than 2/10 pain with use of size 6 vaginal dilator or equivalent for improved tolerance to medical exams and intercourse.  Baseline:  Goal status: INITIAL  3.  Pt to report improved time between bladder voids between 2-3 hours for improved QOL with decreased urinary frequency and no nighttime leakage.  Baseline:  Goal status: INITIAL  4.  Pt to demonstrate no restrictions in trunk mobility or hip mobility to decreased strain at pelvic floor and decreased pain with intercourse.  Baseline:  Goal status: INITIAL  5.  Pt to demonstrate at least 5/5 bil hip strength for improved pelvic stability and functional squats without pain.  Baseline:  Goal status: INITIAL  PLAN:  PT FREQUENCY: 2x/week  PT DURATION:  20 sessions  PLANNED INTERVENTIONS: 97110-Therapeutic exercises, 97530- Therapeutic activity, 97112- Neuromuscular re-education, 97535- Self Care, 36644- Manual therapy, Patient/Family education, Taping, Dry Needling, Joint mobilization, Spinal mobilization, Scar mobilization, DME instructions, Cryotherapy, Moist heat, and Biofeedback  PLAN FOR NEXT SESSION: manual internally if needed and pt consents, trunk and hip stretching, relaxation techniques, voiding mechanics, abdominal massage     Otelia Sergeant, PT, DPT 01/22/2510:15 AM

## 2024-01-23 NOTE — Patient Instructions (Signed)
 Moisturizers They are used in the vagina to hydrate the mucous membrane that make up the vaginal canal. Designed to keep a more normal acid balance (ph) Once placed in the vagina, it will last between two to three days.  Use 2-3 times per week at bedtime  Ingredients to avoid is glycerin and fragrance, can increase chance of infection Should not be used just before sex due to causing irritation Most are gels administered either in a tampon-shaped applicator or as a vaginal suppository. They are non-hormonal.   Types of Moisturizers(internal use)  Vitamin E vaginal suppositories- Whole foods, Amazon Moist Again Coconut oil- can break down condoms, any grocery store (prefer organic) Julva- (Do no use if taking  Tamoxifen) amazon Yes moisturizer- amazon NeuEve Silk , NeuEve Silver for menopausal or over 65 (if have severe vaginal atrophy or cancer treatments use NeuEve Silk for  1 month than move to [employer])- [employer], [place].com Olive and Bee intimate cream- [email or web address].com.au Mae vaginal moisturizer- Amazon Aloe Good Clean Love Hyaluronic acid Hyalofemme Reveree hyaluronic acid inserts   Creams to use externally on the Vulva area Marathon Oil (good for for cancer patients that had radiation to the area)- [place] or [employer].https://garcia-[email or web address]/ Vulva Balm/ V-magic cream by medicine mama- amazon Julva-amazon Vital "V Wild Yam salve ( help moisturize and help with thinning vulvar area, does have Beeswax MoodMaid Botanical Pro-Meno Wild Yam Cream- Amazon Desert Harvest Gele Cleo by Zane Herald labial moisturizer (Amazon),  Coconut or olive oil aloe Good Clean Love Enchanted Rose by intimate rose  Things to avoid in the vaginal area Do not use things to irritate the vulvar area No lotions just specialized creams for the vulva area- Neogyn, V-magic,  No soaps; can use Aveeno or Calendula cleanser, unscented Dove if needed. Must be gentle No deodorants No douches Good to  sleep without underwear to let the vaginal area to air out No scrubbing: spread the lips to let warm water rinse over labias and pat dry   [email or web address]

## 2024-02-05 ENCOUNTER — Ambulatory Visit: Admitting: Physical Therapy

## 2024-02-07 ENCOUNTER — Encounter (HOSPITAL_BASED_OUTPATIENT_CLINIC_OR_DEPARTMENT_OTHER): Payer: Self-pay | Admitting: Obstetrics & Gynecology

## 2024-02-07 MED ORDER — METRONIDAZOLE 500 MG PO TABS: 500.0000 mg | ORAL_TABLET | Freq: Two times a day (BID) | ORAL | 0 refills | Status: DC

## 2024-02-07 NOTE — Addendum Note (Signed)
 Addended by: Jerene Bears on: 02/07/2024 05:46 PM   Modules accepted: Orders

## 2024-02-29 ENCOUNTER — Other Ambulatory Visit (HOSPITAL_BASED_OUTPATIENT_CLINIC_OR_DEPARTMENT_OTHER): Payer: Self-pay | Admitting: Obstetrics & Gynecology

## 2024-03-29 ENCOUNTER — Other Ambulatory Visit (HOSPITAL_BASED_OUTPATIENT_CLINIC_OR_DEPARTMENT_OTHER): Payer: Self-pay | Admitting: Obstetrics & Gynecology

## 2024-03-30 NOTE — Telephone Encounter (Signed)
 LMOM at 8:51 for patient to call the office in reference to refill request. tbw

## 2024-04-03 ENCOUNTER — Encounter (HOSPITAL_BASED_OUTPATIENT_CLINIC_OR_DEPARTMENT_OTHER): Payer: Self-pay | Admitting: Obstetrics & Gynecology

## 2024-04-06 NOTE — Telephone Encounter (Signed)
 LMOM at 5:10 for patient to call office in reference to rx refill. tbw

## 2024-04-07 ENCOUNTER — Encounter: Admitting: Physical Therapy

## 2024-04-09 ENCOUNTER — Encounter: Admitting: Physical Therapy

## 2024-04-14 ENCOUNTER — Encounter: Admitting: Physical Therapy

## 2024-04-16 ENCOUNTER — Encounter: Admitting: Physical Therapy

## 2024-04-28 ENCOUNTER — Encounter: Admitting: Physical Therapy

## 2024-05-05 ENCOUNTER — Encounter: Admitting: Physical Therapy

## 2024-05-07 ENCOUNTER — Encounter: Admitting: Physical Therapy

## 2024-06-08 ENCOUNTER — Encounter: Payer: Self-pay | Admitting: Gastroenterology

## 2024-06-29 ENCOUNTER — Encounter (HOSPITAL_COMMUNITY): Payer: Self-pay

## 2024-06-29 ENCOUNTER — Emergency Department (HOSPITAL_COMMUNITY)
Admission: EM | Admit: 2024-06-29 | Discharge: 2024-06-29 | Disposition: A | Discharge status: Home / Self Care | Attending: Emergency Medicine | Admitting: Emergency Medicine

## 2024-06-29 ENCOUNTER — Emergency Department (HOSPITAL_COMMUNITY)

## 2024-06-29 ENCOUNTER — Other Ambulatory Visit: Payer: Self-pay

## 2024-06-29 DIAGNOSIS — R079 Chest pain, unspecified: Secondary | ICD-10-CM

## 2024-06-29 DIAGNOSIS — R0789 Other chest pain: Secondary | ICD-10-CM | POA: Insufficient documentation

## 2024-06-29 DIAGNOSIS — R0600 Dyspnea, unspecified: Secondary | ICD-10-CM | POA: Diagnosis not present

## 2024-06-29 LAB — BASIC METABOLIC PANEL WITH GFR
Anion gap: 12 (ref 5–15)
BUN: 11 mg/dL (ref 6–20)
CO2: 20 mmol/L — ABNORMAL LOW (ref 22–32)
Calcium: 8.8 mg/dL — ABNORMAL LOW (ref 8.9–10.3)
Chloride: 106 mmol/L (ref 98–111)
Creatinine, Ser: 0.63 mg/dL (ref 0.44–1.00)
GFR, Estimated: >60 mL/min (ref >60)
Glucose, Bld: 106 mg/dL — ABNORMAL HIGH (ref 70–99)
Potassium: 3.8 mmol/L (ref 3.5–5.1)
Sodium: 138 mmol/L (ref 135–145)

## 2024-06-29 LAB — CBC
HCT: 39.1 % (ref 36.0–46.0)
Hemoglobin: 12.7 g/dL (ref 12.0–15.0)
MCH: 29.5 pg (ref 26.0–34.0)
MCHC: 32.5 g/dL (ref 30.0–36.0)
MCV: 90.7 fL (ref 80.0–100.0)
Platelets: 271 K/uL (ref 150–400)
RBC: 4.31 MIL/uL (ref 3.87–5.11)
RDW: 13 % (ref 11.5–15.5)
WBC: 8 K/uL (ref 4.0–10.5)
nRBC: 0 % (ref 0.0–0.2)

## 2024-06-29 LAB — D-DIMER, QUANTITATIVE: D-Dimer, Quant: <0.27 ug{FEU}/mL (ref 0.00–0.50)

## 2024-06-29 LAB — TROPONIN T, HIGH SENSITIVITY
Troponin T High Sensitivity: <15 ng/L (ref 0–19)
Troponin T High Sensitivity: <15 ng/L (ref 0–19)

## 2024-06-29 MED ORDER — NAPROXEN 500 MG PO TABS
500.0000 mg | ORAL_TABLET | Freq: Once | ORAL | Status: AC
  Administered 2024-06-29: 500 mg via ORAL
  Filled 2024-06-29: qty 1

## 2024-06-29 NOTE — ED Provider Notes (Signed)
 [place] EMERGENCY DEPARTMENT AT [facility] Provider Note   CSN: [id number] Arrival date & time: 06/29/24  8158     Patient presents with: Chest Pain   April Campbell is a 48 y.o. female with past medical history of epilepsy, migraine, PCOS, endometriosis, GERD presents emerged department for evaluation of chest pain, shortness of breath.  Reports that she had midsternal and left chest pressure and chest with radiation to shoulders bilaterally that occurs at rest.  Chest pressure worsens with deep inspiration.  Has associated shortness of breath.  Both chest pain and shortness of breath were nonexertional.  At pains worst is an 8/10 but is now 5/10.  Has never seen a cardiologist.  Reports that she was helping move items in garage yesterday but does not believe items were heavy and had injury from this.  Denies cough, congestion, fevers, hemoptysis, recent travel, recent surgery, history of PE/DVT, pedal edema.  Is on estrogen vaginal ring and has sedentary job working from home.     Chest Pain      Prior to Admission medications   Medication Sig Start Date End Date Taking? Authorizing Provider  ARIPiprazole (ABILIFY) 2 MG tablet Take 2 mg by mouth daily.    [provider]  cyclobenzaprine  (FLEXERIL ) 10 MG tablet TAKE 1 TABLET BY MOUTH AT BEDTIME AS NEEDED FOR MUSCLE SPASMS 03/02/24   Cleotilde Ronal RAMAN, MD  estradiol  (ESTRING ) 7.5 MCG/24HR vaginal ring Place 1 each vaginally every 3 (three) months. 01/22/24   Cleotilde Ronal RAMAN, MD  famotidine  (PEPCID ) 20 MG tablet Take 20 mg by mouth at bedtime.    [provider]  ibuprofen  (ADVIL ,MOTRIN ) 200 MG tablet Take 800 mg by mouth as needed for headache or moderate pain.     [provider]  Lactobacillus (PROBIOTIC ACIDOPHILUS PO) Take 1 capsule by mouth daily.     [provider]  lamoTRIgine  (LAMICTAL ) 100 MG tablet Take 1/2 tab in AM, 1/2 tab in PM Patient taking differently: Take 50 mg by mouth 2  (two) times daily. 50mg  in the 75 mg at night 12/09/20   Georjean Darice HERO, MD  metroNIDAZOLE  (FLAGYL ) 500 MG tablet Take 1 tablet (500 mg total) by mouth 2 (two) times daily. 02/07/24   Cleotilde Ronal RAMAN, MD  Multiple Vitamins-Minerals (ONE-A-DAY WOMENS PO) Take 1 tablet by mouth daily.    [provider]  Vilazodone HCl (VIIBRYD) 40 MG TABS Take 40 mg by mouth daily. 07/17/23   [provider]  zonisamide  (ZONEGRAN ) 100 MG capsule Take 5 capsules nightly 08/13/23   Georjean Darice HERO, MD    Allergies: Wellbutrin [bupropion hcl], Escitalopram oxalate, and Topiramate    Review of Systems  Cardiovascular:  Positive for chest pain.    Updated Vital Signs BP 127/85   Pulse 60   Temp 97.9 F (36.6 C) (Oral)   Resp 10   Ht 5' 6 (1.676 m)   Wt 113.4 kg   LMP 04/27/2016 (Approximate)   SpO2 100%   BMI 40.35 kg/m   Physical Exam Vitals and nursing note reviewed.  Constitutional:      General: She is not in acute distress.    Appearance: Normal appearance. She is not ill-appearing.  HENT:     Head: Normocephalic and atraumatic.  Eyes:     Conjunctiva/sclera: Conjunctivae normal.  Cardiovascular:     Rate and Rhythm: Normal rate.  Pulmonary:     Effort: Pulmonary effort is normal. No respiratory distress.  Breath sounds: Normal breath sounds.     Comments: Speaking in full complete sentences without difficulty.  Maintaining oxygen saturation without supplementation Chest:     Chest wall: Tenderness present.  Abdominal:     General: Bowel sounds are normal. There is no distension.     Palpations: Abdomen is soft.     Tenderness: There is no abdominal tenderness. There is no guarding or rebound.  Musculoskeletal:     Right lower leg: No tenderness. No edema.     Left lower leg: No tenderness. No edema.  Skin:    Coloration: Skin is not jaundiced or pale.  Neurological:     Mental Status: She is alert and oriented to [place], place, and time. Mental status is at  baseline.     (all labs ordered are listed, but only abnormal results are displayed) Labs Reviewed  BASIC METABOLIC PANEL WITH GFR - Abnormal; Notable for the following components:      Result Value   CO2 20 (*)    Glucose, Bld 106 (*)    Calcium 8.8 (*)    All other components within normal limits  CBC  D-DIMER, QUANTITATIVE  TROPONIN T, HIGH SENSITIVITY  TROPONIN T, HIGH SENSITIVITY    EKG: None  Radiology: DG Chest 2 View Result Date: 06/29/2024 CLINICAL DATA:  Chest pain and pressure, initial encounter EXAM: CHEST - 2 VIEW COMPARISON:  03/03/2021 FINDINGS: Cardiac shadow is within normal limits. The lungs are well aerated bilaterally. No focal infiltrate or effusion is seen. No bony abnormality is noted. IMPRESSION: No active cardiopulmonary disease. Electronically Signed   By: Oneil Devonshire M.D.   On: 06/29/2024 20:16     Medications Ordered in the ED  naproxen  (NAPROSYN ) tablet 500 mg (500 mg Oral Given 06/29/24 2132)                                    Medical Decision Making Amount and/or Complexity of Data Reviewed Labs: ordered. Radiology: ordered.  Risk Prescription drug management.   Patient presents to the ED for concern of chest pain, shortness of breath, this involves an extensive number of treatment options, and is a complaint that carries with it a high risk of complications and morbidity.  The differential diagnosis includes ACS, pneumonia, fluid overload, MSK, PE, electrolyte abnormality.  Not exhaustive list   Co morbidities that complicate the patient evaluation  See HPI   Additional history obtained:  Additional history obtained from Nursing   External records from outside source obtained and reviewed including ER note   Lab Tests:  I Ordered, and personally interpreted labs.  The pertinent results include:   Troponin x 2 WNL CBG 106 Dimer negative   Imaging Studies ordered:  I ordered imaging studies including chest x-ray I  independently visualized and interpreted imaging which showed no acute cardiopulmonary pathology I agree with the radiologist interpretation   Cardiac Monitoring:  The patient was maintained on a cardiac monitor.  I personally viewed and interpreted the cardiac monitored which showed an underlying rhythm of: NSR at 72 bpm with no ST nor T wave abnormalities   Medicines ordered and prescription drug management:  I ordered medication including naproxen  for pain Reevaluation of the patient after these medicines showed that the patient improved I have reviewed the patients home medicines and have made adjustments as needed    Problem List / ED Course:  CP [facility] Cardiac  workup reassuring Signs WNL with no fever or tachycardia Does not appear fluid overloaded.  No pedal edema.  Lung sounds CTAB Chest x-ray without fluid nor pneumonia Maintain oxygen saturation without supplementation Did obtain dimer as she is taking estrogen via vaginal ring and does have a sedentary job but is otherwise low risk.  Fortunately, this is negative With reassuring cardiac workup, inability to reproduce pain with palpation, pain is likely more MSK in nature.  Provided NSAID for pain. Heart score is low.  Has no family history of cardiac conditions.  Will have her follow-up with PCP   Reevaluation:  After the interventions noted above, I reevaluated the patient and found that they have :improved   Social Determinants of Health:  Has PCP on file   Dispostion:  After consideration of the diagnostic results and the patients response to treatment, I feel that the patent would benefit from outpatient management with PCP follow-up.   Discussed ED workup, disposition, return to ED precautions with patient who expresses understanding agrees with plan.  All questions answered to their satisfaction.  They are agreeable to plan.  Discharge instructions provided on paperwork  Final diagnoses:  Nonspecific  chest pain  Dyspnea, unspecified type    ED Discharge Orders     None        Minnie Tinnie BRAVO, PA 06/29/24 2212    Laurice Maude BROCKS, MD 06/29/24 2342

## 2024-06-29 NOTE — ED Triage Notes (Signed)
 Pt presents to ED from home C/O chest pressure, chest pain with deep breathing, and nausea today.

## 2024-06-29 NOTE — Discharge Instructions (Addendum)
 Thank you for letting us  evaluate you today.  Your chest x-ray did not show any pneumonia nor fluid.  Your electrolytes are within normal limits.  Your cardiac enzymes are normal.  Your dimer which indicates blood clot was negative.  Pain is not due to heart issue or blood clot or pneumonia and may be related to muscle strain. We have given you pain medicine here Emergency Department for chest pain.  Please follow-up with primary care provider for further management.  Make sure to stay adequately hydrated, decrease stress at home  Return to emergency room if experience worsening chest pain, shortness of breath, worsening symptoms

## 2024-07-14 ENCOUNTER — Encounter (HOSPITAL_BASED_OUTPATIENT_CLINIC_OR_DEPARTMENT_OTHER): Payer: Self-pay | Admitting: Obstetrics & Gynecology

## 2024-07-15 ENCOUNTER — Other Ambulatory Visit: Payer: Self-pay

## 2024-07-15 ENCOUNTER — Encounter: Payer: Self-pay | Admitting: Physical Therapy

## 2024-07-15 ENCOUNTER — Ambulatory Visit: Attending: Obstetrics & Gynecology | Admitting: Physical Therapy

## 2024-07-15 ENCOUNTER — Other Ambulatory Visit (HOSPITAL_BASED_OUTPATIENT_CLINIC_OR_DEPARTMENT_OTHER): Payer: Self-pay | Admitting: Obstetrics & Gynecology

## 2024-07-15 DIAGNOSIS — R279 Unspecified lack of coordination: Secondary | ICD-10-CM | POA: Diagnosis present

## 2024-07-15 DIAGNOSIS — M62838 Other muscle spasm: Secondary | ICD-10-CM | POA: Diagnosis present

## 2024-07-15 DIAGNOSIS — M6281 Muscle weakness (generalized): Secondary | ICD-10-CM | POA: Diagnosis present

## 2024-07-15 DIAGNOSIS — R293 Abnormal posture: Secondary | ICD-10-CM | POA: Diagnosis present

## 2024-07-15 DIAGNOSIS — M6289 Other specified disorders of muscle: Secondary | ICD-10-CM | POA: Insufficient documentation

## 2024-07-15 NOTE — Therapy (Signed)
 OUTPATIENT PHYSICAL THERAPY FEMALE PELVIC EVALUATION   Patient Name: Genella Bas Ryans MRN: [id number] DOB:02-16-1976, 48 y.o., female Today's Date: 07/15/2024  END OF SESSION:  PT End of Session - 07/15/24 1644     Visit Number 1    Date for PT Re-Evaluation 01/12/25    Authorization Type [employer]    PT Start Time 1445    PT Stop Time 1540    PT Time Calculation (min) 55 min    Activity Tolerance Patient tolerated treatment well;No increased pain    Behavior During Therapy WFL for tasks assessed/performed          Past Medical History:  Diagnosis Date   Anxiety    Depression    Endometriosis    GERD (gastroesophageal reflux disease)    History of migraine headaches    Seizures (HCC)    controlled with meds, last seizure 4-5 yrs ago   Past Surgical History:  Procedure Laterality Date   CARPAL TUNNEL RELEASE Right 06/14/2015   Procedure: RIGHT CARPAL TUNNEL RELEASE ENDOSCOPIC;  Surgeon: Alm Hummer, MD;  Location: [facility];  Service: Orthopedics;  Laterality: Right;   COLONOSCOPY WITH PROPOFOL  N/A 02/10/2013   Procedure: COLONOSCOPY WITH PROPOFOL ;  Surgeon: Gladis MARLA Louder, MD;  Location: [facility];  Service: Endoscopy;  Laterality: N/A;   CYSTOSCOPY N/A 05/14/2016   Procedure: CYSTOSCOPY;  Surgeon: Ronal GORMAN Pinal, MD;  Location: [facility];  Service: Gynecology;  Laterality: N/A;   DIAGNOSTIC LAPAROSCOPY  12/15/2009   uterosacral nerve ablation   ELBOW SURGERY Left 2012   LAPAROSCOPIC BILATERAL SALPINGECTOMY Bilateral 05/14/2016   Procedure: LAPAROSCOPIC BILATERAL SALPINGECTOMY;  Surgeon: Ronal GORMAN Pinal, MD;  Location: [facility];  Service: Gynecology;  Laterality: Bilateral;   LAPAROSCOPIC HYSTERECTOMY N/A 05/14/2016   Procedure: HYSTERECTOMY TOTAL LAPAROSCOPIC;  Surgeon: Ronal GORMAN Pinal, MD;  Location: [facility];  Service: Gynecology;  Laterality: N/A;   UMBILICAL HERNIA REPAIR     as an infant   WISDOM TOOTH EXTRACTION     YAG LASER APPLICATION  06/24/2006    vaporization of endometriosis   Patient Active Problem List   Diagnosis Date Noted   Anxiety 12/14/2016   Endometriosis 04/25/2016   PCOS (polycystic ovarian syndrome) 03/19/2016   Epilepsy with partial complex seizures (HCC) 01/21/2016   Idiopathic generalized epilepsy (HCC) 12/06/2015   Migraine without aura and responsive to treatment 12/06/2015    PCP: Rankins, Richerd SAUNDERS, MD  REFERRING PROVIDER: Pinal Ronal GORMAN, MD  REFERRING DIAG: M62.89 (ICD-10-CM) - Pelvic floor dysfunction in female   THERAPY DIAG:  Other muscle spasm  Muscle weakness (generalized)  Unspecified lack of coordination  Abnormal posture  Rationale for Evaluation and Treatment: Rehabilitation  ONSET DATE: chronic since 2000, worse in 2024  SUBJECTIVE:  SUBJECTIVE STATEMENT: Patient stats that she has had pain with intercourse for several years, did PT for it, found ways to manage it. It has some back, it is little bit more aggressive. Cannot find position where it is comfortable. Can be burning, sore. Has been exercising more, not sure if that has aggravated it. Peeing more frequently than she used to, drinking more water . Increased stress, getting older.  Has messed around with toys hurts where they stitched her up Hx of endo, hysterectomy 2017 Fluid intake: water   FUNCTIONAL LIMITATIONS: intercourse  PERTINENT HISTORY:  Medications for current condition: estrogen cream, flexeril  Surgeries: hysterectomy Other:  Sexual abuse: No  DIAGNOSTIC FINDINGS:  Post-void residual: Voiding Cystourethrogram (VCUG):  Ultrasound: PAIN:  Are you having pain? Yes NPRS scale: 9-10/10 with intercourse Pain location: Internal  Pain type: burning Pain description: intermittent   Aggravating factors: intercourse Relieving  factors: no intercourse  PRECAUTIONS: None  RED FLAGS: None   WEIGHT BEARING RESTRICTIONS: No  FALLS:  Has patient fallen in last 6 months? No  OCCUPATION: desk job- Event organiser of agriculture  ACTIVITY LEVEL : some walks with dogs  PLOF: Independent  PATIENT GOALS: reduce pain with intercourse   BOWEL MOVEMENT:constipation most of the time Pain with bowel movement: Yes sometimes Type of bowel movement:Type (Bristol Stool Scale) 2-3 Fully empty rectum: Yes: most of the time Leakage: No                                                      Pads: No Fiber supplement/laxative Yes fiber, senokot  URINATION: no issues Leakage: sometimes with sleeping  INTERCOURSE:  Ability to have vaginal penetration Yes  Pain with intercourse: Initial Penetration, During Penetration, and After Intercourse Dryness: Yes - does not use vaginal estrogen Climax: yes Marinoff Scale: 1/3 Lubricant: yes  PREGNANCY: no children   PROLAPSE: None   OBJECTIVE:  Note: Objective measures were completed at Evaluation unless otherwise noted.  DIAGNOSTIC FINDINGS:    PATIENT SURVEYS:    PFIQ-7: 14  COGNITION: Overall cognitive status: Within functional limits for tasks assessed     SENSATION: Light touch: Appears intact  LUMBAR SPECIAL TESTS:  Single leg stance test: Negative  FUNCTIONAL TESTS:   Single leg stance:  Mu:cjohld  Lt: valgus Sit-up test: 1/4 Squat:1/2   GAIT: Assistive device utilized: None  POSTURE: rounded shoulders, forward head, increased lumbar lordosis, and anterior pelvic tilt   LUMBARAROM/PROM: within functional limitations  LOWER EXTREMITY ROM: within functional limitations   LOWER EXTREMITY MMT: 4/5 overall PALPATION:  General: tenderness around inferior rami pubic symphysis  Pelvic Alignment: even  Abdominal: no tenderness around laparoscopic scars  Diastasis: No Distortion: No  Breathing: upper chest, braces abdomen Scar tissue: Yes:                   External Perineal Exam: mild dryness present                             Internal Pelvic Floor: vaginal canal narrow but no tenderness present with palpation- just around pubic rami, difficulty with lengthening/ bulging  Patient confirms identification and approves PT to assess internal pelvic floor and treatment Yes  PELVIC MMT:   MMT eval  Vaginal 5/5, 5 sec hold  Internal Anal Sphincter  External Anal Sphincter   Puborectalis   Diastasis Recti no  (Blank rows = not tested)        TONE: high  PROLAPSE: mild anterior vaginal wall laxity with bulging  TODAY'S TREATMENT:                                                                                                                              DATE: 07/15/2024  EVAL  Examination completed, findings reviewed, pt educated on POC, HEP, and female pelvic floor anatomy, reasoning with pelvic floor assessment internally with pt consent. Pt motivated to participate in PT and agreeable to attempt recommendations.     PATIENT EDUCATION:  Education details: Pt was educated on relevant anatomy, exam findings, home exercise program, plan of care, expectations of PT and ohnut. Educated patient on dilator protocol as well, hadout given, she will use a vibrator to start with  Vandegrift educated: Patient Education method: Explanation, Demonstration, Tactile cues, Verbal cues, and Handouts Education comprehension: verbalized understanding, returned demonstration, verbal cues required, tactile cues required, and needs further education  HOME EXERCISE PROGRAM: To be developed- down training focus, constipation and dyspareunia   ASSESSMENT:  CLINICAL IMPRESSION: Patient is a 48 y.o. F who was seen today for physical therapy evaluation and treatment for dyspareunia. Exam findings are notable for upper chest breathing strategies, abdominal bracing, pelvic floor muscle increased tone, high tone in pelvic floor. External soft tissues of  pelvic floor appear dry. Patient demonstrates good trunk mobility, bilateral hip mobility, tender and weak abdominal muscles, trigger points in glutes and lumbar paraspinals, reduced range or motion in pelvic floor with bulging and pain with intercourse. It is difficult for patient to participate in intercourse  due to chronic pain . Discussed findings with patient, educated patient on using ohnuts and HEP was initiated. Patient's quality of life has been affected, patient will benefit from physical therapy to address deficits, reduce pain and improve constipation and leaking urine at night and quality of life.    OBJECTIVE IMPAIRMENTS: decreased coordination, decreased knowledge of condition, decreased ROM, increased fascial restrictions, increased muscle spasms, impaired tone, obesity, and pain.   ACTIVITY LIMITATIONS: continence, toileting, and intercourse  PARTICIPATION LIMITATIONS: intercourse  PERSONAL FACTORS: Past/current experiences and Time since onset of injury/illness/exacerbation are also affecting patient's functional outcome.   REHAB POTENTIAL: Good  CLINICAL DECISION MAKING: Evolving/moderate complexity  EVALUATION COMPLEXITY: Moderate   GOALS: Goals reviewed with patient? Yes  SHORT TERM GOALS: Target date: 08/12/2024    Patient will be educated on healthy bowel PT recommendations Baseline: Goal status: INITIAL  2.  Patient will be I with dilator use Baseline:  Goal status: INITIAL  3.  Patient will be I with her HEP Baseline:  Goal status: INITIAL   LONG TERM GOALS: Target date: 01/12/2025  Patient will report max 2/10 pain with intercourse with husband Baseline:  Goal status: INITIAL  2.  Patient will report regular bowel movements Baseline:  Goal  status: INITIAL  3.  Patient will not leak at night Baseline:  Goal status: INITIAL  4.  Patient will be I with advanced HEP Baseline:  Goal status: INITIAL    PLAN:  PT FREQUENCY: 1-2x/week  PT  DURATION: 6 months  PLANNED INTERVENTIONS: 97110-Therapeutic exercises, 97530- Therapeutic activity, 97112- Neuromuscular re-education, 97535- Self Care, [id number]- Manual therapy, [phone]- Electrical stimulation (manual), [phone] (1-2 muscles), [phone] (3+ muscles)- Dry Needling, Patient/Family education, Taping, Joint mobilization, Joint manipulation, Spinal manipulation, Spinal mobilization, Scar mobilization, Cryotherapy, Moist heat, and Biofeedback  PLAN FOR NEXT SESSION: education on dilator protocol, exercises for diaphragmatic breathing, downtraining, abdominal massage, constipation strategies   Kushi Kun, PT 07/15/2024, 4:45 PM

## 2024-07-29 ENCOUNTER — Ambulatory Visit: Payer: BC Managed Care – PPO | Admitting: Neurology

## 2024-07-30 ENCOUNTER — Encounter: Payer: Self-pay | Admitting: Gastroenterology

## 2024-07-30 ENCOUNTER — Ambulatory Visit: Payer: Self-pay | Admitting: Gastroenterology

## 2024-07-30 ENCOUNTER — Ambulatory Visit: Admitting: Gastroenterology

## 2024-07-30 ENCOUNTER — Other Ambulatory Visit (INDEPENDENT_AMBULATORY_CARE_PROVIDER_SITE_OTHER)

## 2024-07-30 VITALS — BP 114/76 | HR 72 | Ht 66.0 in | Wt 236.0 lb

## 2024-07-30 DIAGNOSIS — R1012 Left upper quadrant pain: Secondary | ICD-10-CM | POA: Diagnosis not present

## 2024-07-30 DIAGNOSIS — Z1211 Encounter for screening for malignant neoplasm of colon: Secondary | ICD-10-CM | POA: Diagnosis not present

## 2024-07-30 DIAGNOSIS — J392 Other diseases of pharynx: Secondary | ICD-10-CM

## 2024-07-30 DIAGNOSIS — K5909 Other constipation: Secondary | ICD-10-CM

## 2024-07-30 DIAGNOSIS — Z83719 Family history of colon polyps, unspecified: Secondary | ICD-10-CM

## 2024-07-30 LAB — COMPREHENSIVE METABOLIC PANEL WITH GFR
ALT: 13 U/L (ref 0–35)
AST: 14 U/L (ref 0–37)
Albumin: 4.4 g/dL (ref 3.5–5.2)
Alkaline Phosphatase: 95 U/L (ref 39–117)
BUN: 13 mg/dL (ref 6–23)
CO2: 25 meq/L (ref 19–32)
Calcium: 8.7 mg/dL (ref 8.4–10.5)
Chloride: 105 meq/L (ref 96–112)
Creatinine, Ser: 0.63 mg/dL (ref 0.40–1.20)
GFR: 106.25 mL/min (ref >60.00)
Glucose, Bld: 96 mg/dL (ref 70–99)
Potassium: 4.1 meq/L (ref 3.5–5.1)
Sodium: 139 meq/L (ref 135–145)
Total Bilirubin: 0.3 mg/dL (ref 0.2–1.2)
Total Protein: 7.3 g/dL (ref 6.0–8.3)

## 2024-07-30 LAB — CBC WITH DIFFERENTIAL/PLATELET
Basophils Absolute: 0 K/uL (ref 0.0–0.1)
Basophils Relative: 0.3 % (ref 0.0–3.0)
Eosinophils Absolute: 0.1 K/uL (ref 0.0–0.7)
Eosinophils Relative: 1 % (ref 0.0–5.0)
HCT: 40.9 % (ref 36.0–46.0)
Hemoglobin: 13.8 g/dL (ref 12.0–15.0)
Lymphocytes Relative: 28.3 % (ref 12.0–46.0)
Lymphs Abs: 2 K/uL (ref 0.7–4.0)
MCHC: 33.7 g/dL (ref 30.0–36.0)
MCV: 89.9 fl (ref 78.0–100.0)
Monocytes Absolute: 0.5 K/uL (ref 0.1–1.0)
Monocytes Relative: 6.5 % (ref 3.0–12.0)
Neutro Abs: 4.5 K/uL (ref 1.4–7.7)
Neutrophils Relative %: 63.9 % (ref 43.0–77.0)
Platelets: 291 K/uL (ref 150.0–400.0)
RBC: 4.55 Mil/uL (ref 3.87–5.11)
RDW: 13.2 % (ref 11.5–15.5)
WBC: 7 K/uL (ref 4.0–10.5)

## 2024-07-30 LAB — TSH: TSH: 2.3 u[IU]/mL (ref 0.35–5.50)

## 2024-07-30 MED ORDER — LINACLOTIDE 145 MCG PO CAPS: ORAL_CAPSULE | ORAL | 0 refills | Status: DC

## 2024-07-30 MED ORDER — IBGARD 90 MG PO CPCR: ORAL_CAPSULE | ORAL | 0 refills | Status: AC

## 2024-07-30 MED ORDER — OMEPRAZOLE 20 MG PO CPDR: 20.0000 mg | DELAYED_RELEASE_CAPSULE | Freq: Every day | ORAL | 3 refills | Status: DC

## 2024-07-30 MED ORDER — NA SULFATE-K SULFATE-MG SULF 17.5-3.13-1.6 GM/177ML PO SOLN: 1.0000 | Freq: Once | ORAL | 0 refills | Status: AC

## 2024-07-30 NOTE — Patient Instructions (Addendum)
 Your provider has requested that you go to the basement level for lab work before leaving today. Press B on the elevator. The lab is located at the first door on the left as you exit the elevator.  Due to recent changes in healthcare laws, you may see the results of your imaging and laboratory studies on MyChart before your provider has had a chance to review them.  We understand that in some cases there may be results that are confusing or concerning to you. Not all laboratory results come back in the same time frame and the provider may be waiting for multiple results in order to interpret others.  Please give us  48 hours in order for your provider to thoroughly review all the results before contacting the office for clarification of your results.   We have sent the following medications to your pharmacy for you to pick up at your convenience: Omeprazole 20 mg, take one capsule daily.  We have given you samples of the following medication to take: Linzess  145 mcg, take 1 capsule 20-30 minutes before meal.  IBgard, take 2 capsules as needed for GERD symptoms.  Follow up in 1 months.  You have been scheduled for a colonoscopy. Please follow written instructions given to you at your visit today.   If you use inhalers (even only as needed), please bring them with you on the day of your procedure.  DO NOT TAKE 7 DAYS PRIOR TO TEST- Trulicity (dulaglutide) Ozempic, Wegovy (semaglutide) Mounjaro (tirzepatide) Bydureon Bcise (exanatide extended release)  DO NOT TAKE 1 DAY PRIOR TO YOUR TEST Rybelsus (semaglutide) Adlyxin (lixisenatide) Victoza (liraglutide) Byetta (exanatide) ___________________________________________________________________________   Thank you for trusting me with your gastrointestinal care!   Camie Furbish, PA-C  _______________________________________________________  If your blood pressure at your visit was 140/90 or greater, please contact your primary care  physician to follow up on this.  _______________________________________________________  If you are age 46 or older, your body mass index should be between 23-30. Your Body mass index is 38.09 kg/m. If this is out of the aforementioned range listed, please consider follow up with your Primary Care Provider.  If you are age 79 or younger, your body mass index should be between 19-25. Your Body mass index is 38.09 kg/m. If this is out of the aformentioned range listed, please consider follow up with your Primary Care Provider.   ________________________________________________________  The [facility] providers would like to encourage you to use MYCHART to communicate with providers for non-urgent requests or questions.  Due to long hold times on the telephone, sending your provider a message by [facility] may be a faster and more efficient way to get a response.  Please allow 48 business hours for a response.  Please remember that this is for non-urgent requests.  _______________________________________________________  [facility] is using a team-based approach to care.  Your team is made up of your doctor and two to three APPS. Our APPS (Nurse Practitioners and Physician Assistants) work with your physician to ensure care continuity for you. They are fully qualified to address your health concerns and develop a treatment plan. They communicate directly with your gastroenterologist to care for you. Seeing the Advanced Practice Practitioners on your physician's team can help you by facilitating care more promptly, often allowing for earlier appointments, access to diagnostic testing, procedures, and other specialty referrals.

## 2024-07-30 NOTE — Progress Notes (Signed)
 April Campbell [id number] 20-Nov-1975   Chief Complaint: Abdominal pain, constipation  Referring Provider: Loretha Richerd SAUNDERS, MD Primary GI MD: Dr. Legrand  HPI: April Campbell is a 48 y.o. female with past medical history of anxiety/depression, endometriosis, GERD, migraines, seizures who presents today for a complaint of abdominal pain and constipation.    Seen in office 11/29/2016 by Greig Corti, PA-C for complaint of chronic constipation.  Did have a colonoscopy April 2014 which was normal.  Plan at that time was to increase Linzess  to 290 mcg daily, continue Metamucil and liberal water  intake daily.  She was given a bowel purge with Suprep at that time as she had not had a bowel movement in a couple weeks.  Discussed contribution of constipation by medications particularly amitriptyline .  Patient states she was referred by her PCP for further evaluation of LUQ abdominal pain.  Has history of chronic constipation and states that she is currently taking fiber and a probiotic daily, has been doing this for years.  Has tried multiple things OTC in the past which only give temporary relief including magnesium citrate.  Periodically will take a Dulcolax, sometimes Senokot and magnesium at night.  Previously these treatments were more effective than they are currently.  Can go up to a week without a bowel movement, though this week has had bowel movements daily.  Occasionally has sensation of incomplete evacuation, may have hard stools or pass small balls of stool.  Denies any recent rectal bleeding but has had this in the past.  Last colonoscopy 2014.  States LUQ abdominal pain occurs randomly/intermittently and can cause her to catch her breath.  Feels pain directly under her left ribs which is not sharp, but does often last for hours and states that she has to go lay down in order for pain to pass.  Does interrupt her work and life.  Has not experienced any pain in the last few weeks.  Denies any  clear patterns or aggravating factors.  States she had a CT scan late last year which showed minor kidney stones and stool throughout the colon.  Scan was done for LUQ abdominal pain and was unrevealing as far as a source of pain.  She has noticed that pain seems to occur when she is more constipated, possibly improves with bowel movements.  Has not taken anything OTC for pain.  Does recall in the past that Linzess  was helpful but had caused some urgency and fecal incontinence.  Denies any hemorrhoids or rectal pain with bowel movements.  Denies history of thyroid  problems.  States she has a physical scheduled for the end of the month and will have some blood work at that time.  Had been experiencing some nausea and loss of appetite but this has resolved.  Reports occasional heartburn/acid reflux, occurring less than weekly and with identifiable food triggers.  Sometimes has some unprovoked coughing which occurs at random and has sensation that something is in her throat.  States she has a sensitive gag reflex and sometimes has trouble brushing her teeth because of this.  Has taking Pepcid .  States her weight fluctuates, eating habits fluctuate, but she denies any significant unintentional weight loss.  Her father had liver cancer, paternal grandmother had ovarian cancer, uncle had pancreatic cancer.  Her mother had colon polyps though she is unsure whether these were precancerous.  No family history of colon, stomach, or esophageal cancer.  States her last seizure was over 10 years ago.  She  is on Zonegran  daily.  Denies heart or lung problems.  Denies shortness of breath or chest pain.  Previous GI Procedures/Imaging   Colonoscopy 02/10/2013 Normal diagnostic proctoscopy colonoscopy to the cecum with inspection of the terminal ileum   Past Medical History:  Diagnosis Date   Anxiety    Depression    Endometriosis    GERD (gastroesophageal reflux disease)    History of migraine  headaches    Seizures (HCC)    controlled with meds, last seizure 4-5 yrs ago   UTI (urinary tract infection)     Past Surgical History:  Procedure Laterality Date   CARPAL TUNNEL RELEASE Right 06/14/2015   Procedure: RIGHT CARPAL TUNNEL RELEASE ENDOSCOPIC;  Surgeon: Alm Hummer, MD;  Location: [facility];  Service: Orthopedics;  Laterality: Right;   COLONOSCOPY WITH PROPOFOL  N/A 02/10/2013   Procedure: COLONOSCOPY WITH PROPOFOL ;  Surgeon: Gladis MARLA Louder, MD;  Location: [facility];  Service: Endoscopy;  Laterality: N/A;   CYSTOSCOPY N/A 05/14/2016   Procedure: CYSTOSCOPY;  Surgeon: Ronal GORMAN Pinal, MD;  Location: [facility];  Service: Gynecology;  Laterality: N/A;   DIAGNOSTIC LAPAROSCOPY  12/15/2009   uterosacral nerve ablation   ELBOW SURGERY Left 2012   LAPAROSCOPIC BILATERAL SALPINGECTOMY Bilateral 05/14/2016   Procedure: LAPAROSCOPIC BILATERAL SALPINGECTOMY;  Surgeon: Ronal GORMAN Pinal, MD;  Location: [facility];  Service: Gynecology;  Laterality: Bilateral;   LAPAROSCOPIC HYSTERECTOMY N/A 05/14/2016   Procedure: HYSTERECTOMY TOTAL LAPAROSCOPIC;  Surgeon: Ronal GORMAN Pinal, MD;  Location: [facility];  Service: Gynecology;  Laterality: N/A;   UMBILICAL HERNIA REPAIR     as an infant   WISDOM TOOTH EXTRACTION     YAG LASER APPLICATION  06/24/2006   vaporization of endometriosis    Current Outpatient Medications  Medication Sig Dispense Refill   cyclobenzaprine  (FLEXERIL ) 10 MG tablet TAKE 1 TABLET BY MOUTH AT BEDTIME AS NEEDED FOR MUSCLE SPASMS 30 tablet 0   estradiol  (ESTRING ) 7.5 MCG/24HR vaginal ring Place 1 each vaginally every 3 (three) months. 1 each 3   famotidine  (PEPCID ) 20 MG tablet Take 20 mg by mouth at bedtime.     ibuprofen  (ADVIL ,MOTRIN ) 200 MG tablet Take 800 mg by mouth as needed for headache or moderate pain.      Lactobacillus (PROBIOTIC ACIDOPHILUS PO) Take 1 capsule by mouth daily.      lamoTRIgine  (LAMICTAL ) 100 MG tablet Take 1/2 tab in AM, 1/2 tab in PM      Multiple Vitamins-Minerals (ONE-A-DAY WOMENS PO) Take 1 tablet by mouth daily.     PRISTIQ 100 MG 24 hr tablet Take 100 mg by mouth daily.     traZODone (DESYREL) 50 MG tablet Take 50-150 mg by mouth at bedtime as needed.     zonisamide  (ZONEGRAN ) 100 MG capsule Take 5 capsules nightly 450 capsule 3   No current facility-administered medications for this visit.    Allergies as of 07/30/2024 - Review Complete 07/30/2024  Allergen Reaction Noted   Wellbutrin [bupropion hcl] Other (See Comments) 11/19/2011   Escitalopram oxalate Other (See Comments) 02/17/2016   Topiramate Other (See Comments) 02/17/2016    Family History  Problem Relation Age of Onset   Hypertension Mother    Diabetes Mother    Irritable bowel syndrome Mother    Stroke Mother    Other Father        bile duct cancer   Liver cancer Father    Hypertension Maternal Grandmother    Diabetes Maternal Grandmother    Irritable  bowel syndrome Maternal Aunt    Ovarian cancer Other    Breast cancer Neg Hx    BRCA 1/2 Neg Hx     Social History   Tobacco Use   Smoking status: Never   Smokeless tobacco: Never  Vaping Use   Vaping status: Never Used  Substance Use Topics   Alcohol use: Yes    Alcohol/week: 2.0 standard drinks of alcohol    Types: 2 Standard drinks or equivalent per week    Comment: rare   Drug use: No     Review of Systems:    Constitutional: No fever, chills Cardiovascular: No chest pain Respiratory: No SOB  Gastrointestinal: See HPI and otherwise negative   Physical Exam:  Vital signs: BP 114/76 (BP Location: Left Arm, Patient Position: Sitting, Cuff Size: Large)   Pulse 72   Ht 5' 6 (1.676 m)   Wt 236 lb (107 kg)   LMP 04/27/2016 (Approximate)   BMI 38.09 kg/m   Constitutional: Pleasant, obese female in NAD, alert and cooperative Head:  Normocephalic and atraumatic.  Eyes: No scleral icterus.  Respiratory: Respirations even and unlabored. Lungs clear to auscultation bilaterally.   No wheezes, crackles, or rhonchi.  Cardiovascular:  Regular rate and rhythm. No murmurs. No peripheral edema. Gastrointestinal:  Soft, nondistended, nontender. No rebound or guarding. Normal bowel sounds. No appreciable masses or hepatomegaly. Rectal: Deferred to colonoscopy Neurologic:  Alert and oriented x4;  grossly normal neurologically.  Skin:   Dry and intact without significant lesions or rashes. Psychiatric: Oriented to [place], place and time. Demonstrates good judgement and reason without abnormal affect or behaviors.   RELEVANT LABS AND IMAGING: CBC    Component Value Date/Time   WBC 8.0 06/29/2024 1922   RBC 4.31 06/29/2024 1922   HGB 12.7 06/29/2024 1922   HGB 11.9 (L) 02/17/2016 1625   HCT 39.1 06/29/2024 1922   PLT 271 06/29/2024 1922   MCV 90.7 06/29/2024 1922   MCH 29.5 06/29/2024 1922   MCHC 32.5 06/29/2024 1922   RDW 13.0 06/29/2024 1922   LYMPHSABS 3.2 03/03/2021 1605   MONOABS 0.8 03/03/2021 1605   EOSABS 0.1 03/03/2021 1605   BASOSABS 0.1 03/03/2021 1605    CMP     Component Value Date/Time   NA 138 06/29/2024 1922   K 3.8 06/29/2024 1922   CL 106 06/29/2024 1922   CO2 20 (L) 06/29/2024 1922   GLUCOSE 106 (H) 06/29/2024 1922   BUN 11 06/29/2024 1922   CREATININE 0.63 06/29/2024 1922   CALCIUM 8.8 (L) 06/29/2024 1922   PROT 6.6 01/12/2010 0144   ALBUMIN 3.3 (L) 01/12/2010 0144   AST 19 01/12/2010 0144   ALT 15 01/12/2010 0144   ALKPHOS 71 01/12/2010 0144   BILITOT 0.4 01/12/2010 0144   GFRNONAA >60 06/29/2024 1922   GFRAA >60 05/15/2016 0510     Assessment/Plan:   Screening for colon cancer Family history of colon polyps Patient due for repeat screening colonoscopy.  Last colonoscopy 2014 was normal with no polyps found.  She does have family history of colon polyps in her mother though unsure what type of polyps these were.  No family history of colon cancer.  - Schedule colonoscopy. I thoroughly discussed the procedure with the patient to  include nature of the procedure, alternatives, benefits, and risks (including but not limited to bleeding, infection, perforation, anesthesia/cardiac/pulmonary complications). Patient verbalized understanding and gave verbal consent to proceed with procedure.   Chronic constipation LUQ abdominal pain Patient reports intermittent  LUQ abdominal pain which has been ongoing for about a year.  Reports having a CT scan last year which showed stool throughout the colon and minor kidney stones and otherwise unremarkable and unrevealing as far as a source of pain.  Chronic constipation somewhat managed with OTC treatments including magnesium citrate, Dulcolax, Senokot.  Has tried Linzess  in the past and states that it caused fecal urgency and incontinence, though per chart review looks like she was on the highest dose.  She is open to trying this again at a lower dose.  Has had rectal bleeding in the past but nothing recently and denies any rectal pain or hemorrhoids.  Evaluate further with upcoming colonoscopy, though possibly LUQ pain due to constipation.  Pain seems to occur when she is more constipated.  - Labs today: CBC, CMP, TSH, TTG, IgA - Trial of Linzess  145 mcg, samples given - IBGard samples given - Consider antispasmodic  Throat irritation Patient reports very infrequent heartburn/acid reflux which occurs less than weekly and with identifiable food triggers.  Has been taking Pepcid .  Has some occasional throat irritation and unprovoked coughing, sensitive gag reflex.  Denies any dysphagia.  - Start trial of Omeprazole 20 mg for 6 weeks   Camie Furbish, PA-C [facility] 07/30/2024, 9:44 AM  Patient Care Team: Rankins, Richerd SAUNDERS, MD as PCP - General (Family Medicine) Georganne Frieze, MD as Referring Physician [facility]) Remonia Alm PARAS, MD as Consulting Physician (Family Medicine) Georjean Darice HERO, MD as Consulting Physician (Neurology)

## 2024-07-31 LAB — TISSUE TRANSGLUTAMINASE ABS,IGG,IGA
(tTG) Ab, IgA: <1 U/mL
(tTG) Ab, IgG: <1 U/mL

## 2024-07-31 LAB — IGA: Immunoglobulin A: 128 mg/dL (ref 47–310)

## 2024-08-01 ENCOUNTER — Encounter: Payer: Self-pay | Admitting: Gastroenterology

## 2024-08-03 NOTE — Progress Notes (Signed)
 ____________________________________________________________  Attending physician addendum:  Thank you for sending this case to me. I have reviewed the entire note and agree with the plan.   Victory Brand, MD  ____________________________________________________________

## 2024-08-05 ENCOUNTER — Ambulatory Visit (INDEPENDENT_AMBULATORY_CARE_PROVIDER_SITE_OTHER): Admitting: Neurology

## 2024-08-05 ENCOUNTER — Encounter: Payer: Self-pay | Admitting: Neurology

## 2024-08-05 DIAGNOSIS — G40309 Generalized idiopathic epilepsy and epileptic syndromes, not intractable, without status epilepticus: Secondary | ICD-10-CM | POA: Diagnosis not present

## 2024-08-05 MED ORDER — ZONISAMIDE 100 MG PO CAPS: ORAL_CAPSULE | ORAL | 4 refills | Status: AC

## 2024-08-05 NOTE — Progress Notes (Signed)
 NEUROLOGY FOLLOW UP OFFICE NOTE  April Campbell [id number] Jul 06, 1976  HISTORY OF PRESENT ILLNESS: I had the pleasure of seeing April Campbell in follow-up in the neurology clinic on 08/05/2024.  The patient was last seen a year ago for Primary Generalized Epilepsy. She is alone in the office today. Records and images were personally reviewed where available.  Since her last visit, she continues to do well with no GTCs since 2017. She is on Zonisamide  500mg  at bedtime (100mg : 5 caps at bedtime) and Lamotrigine  100mg  1/2 tab BID (prescribed by Psychiatry). She had a few sensations while switching medication (Viibryd to [employer]), and feels better on Pristiq. She denies any staring/unresponsive episodes, gaps in time, olfactory/gustatory hallucinations, focal numbness/tingling/weakness, myoclonic jerks.She has not had panic attacks. No significant headaches, dizziness, vision changes, no falls. Sleep is better, she gets 6-7 hours. Mood is good. She is driving and works remotely. She lives with her husband.    History on Initial Assessment 03/25/2020: This is a 48 year old right-handed woman with a history of anxiety, depression, migraines, presenting for a second opinion regarding epilepsy. Records from [facility] were reviewed. She was last seen at [facility] 2 months ago. She had a GTC in 2002 while on Wellbutrin, EEG at that time showed bifrontal spikes in drowsiness. She was started on Depakote but had side effects. She went to [facility] for a second opinion and had a normal MRI and EEG, tapered off Depakote. Seizures recurred in 2010 and she was started on Levetiracetam , however it was ineffective and she was changed to Zonisamide  and Lamotrigine . She had a breakthrough seizure due to medication refill mixup in 2013 and was seizure-free until 2019 when trial of Zonisamide  taper was done. She started having recurrent episodes of feeling like a seizure was coming on with a feeling of warmth and everything around  her going faster. She also reported deja vu feelings. She was reporting cognitive and word-finding issues, she felt she was overmedicated and had an EMU admission in February 2021 where Lamotrigine  and Zonisamide  were held. Typical events were not captured, however baseline EEG showed generalized 3 Hz polyspike and wave interictal activity. She was told that there was no need for LTG, on her follow-up in March 2021, she reported feeling very anxious, especially about not being tapered off Lamotrigine  and poor communication about restarting it. She felt a lack of support for her symptoms. Lamotrigine  was restarted by her PCP to 100mg  in AM, 150mg  in PM, but now she is on 50mg  BID. She continues on Zonisamide  500mg  qhs with no side effects. She continues to report word-finding difficulties. She is feeling a lot better with reduction in Lamotrigine  dose and follows up with Psychiatry. She denies any significant falls.  She states she has had 3 or 4 GTCs in her lifetime. She denies losing consciousness with most of her seizures, she describes sensations of high energy inside but everything around is moving in slow motion, like I can see the future. Sometimes there is a buzzing, strong, heat, strong fear, there is always an intense sensation that started in her childhood. In HS this was attributed to caffeine, then she had the first GTC in her early 40s attributed to Wellbutrin. Her last GTC was in 2017. Seizure triggers include lack of sleep/food, stress. She reports that seizures had quieted down prior to this year with a lot going on. This past months she feels she is doing better, previously she was having smaller seizures once  a week, lasting for a minute. She feels tired and shaken up after, no focal weakness. They deny any staring/unresponsive episodes, she can talk and respond. Her husband reports she twitches a lot in her sleep. She has migraines once a week and takes prn Advil  and Cambia . There is  occasional photophobia, no nausea/vomiting. She denies any dizziness, diplopia, dysarthria/dysphagia, bladder dysfunction. She has neck pain, occasional tingling in both arms. She has constipation.   Prior AEDs: Topamax, Depakote, Keppra   Diagnostic Data: EMU admission Feb 1-4, 2021 at [facility]: Typical events were not captured. Baseline showed generalized 3 Hz PSW interictal activity. MRI brain in 2017 without contrast was normal.   PAST MEDICAL HISTORY: Past Medical History:  Diagnosis Date   Anxiety    Depression    Endometriosis    GERD (gastroesophageal reflux disease)    History of migraine headaches    Seizures (HCC)    controlled with meds, last seizure 4-5 yrs ago   UTI (urinary tract infection)     MEDICATIONS: Current Outpatient Medications on File Prior to Visit  Medication Sig Dispense Refill   cyclobenzaprine  (FLEXERIL ) 10 MG tablet TAKE 1 TABLET BY MOUTH AT BEDTIME AS NEEDED FOR MUSCLE SPASMS 30 tablet 0   estradiol  (ESTRING ) 7.5 MCG/24HR vaginal ring Place 1 each vaginally every 3 (three) months. 1 each 3   famotidine  (PEPCID ) 20 MG tablet Take 20 mg by mouth at bedtime.     ibuprofen  (ADVIL ,MOTRIN ) 200 MG tablet Take 800 mg by mouth as needed for headache or moderate pain.      Lactobacillus (PROBIOTIC ACIDOPHILUS PO) Take 1 capsule by mouth daily.      lamoTRIgine  (LAMICTAL ) 100 MG tablet Take 1/2 tab in AM, 1/2 tab in PM     linaclotide  (LINZESS ) 145 MCG CAPS capsule Take 1 capsule daily. 8 capsule 0   Multiple Vitamins-Minerals (ONE-A-DAY WOMENS PO) Take 1 tablet by mouth daily.     Peppermint Oil (IBGARD) 90 MG CPCR Take 2 capsules as directed. 8 capsule 0   PRISTIQ 100 MG 24 hr tablet Take 100 mg by mouth daily.     traZODone (DESYREL) 50 MG tablet Take 50-150 mg by mouth at bedtime as needed.     zonisamide  (ZONEGRAN ) 100 MG capsule Take 5 capsules nightly 450 capsule 3   omeprazole (PRILOSEC) 20 MG capsule Take 1 capsule (20 mg total) by mouth daily. (Patient  not taking: Reported on 08/05/2024) 30 capsule 3   No current facility-administered medications on file prior to visit.    ALLERGIES: Allergies  Allergen Reactions   Wellbutrin [Bupropion Hcl] Other (See Comments)    SEIZURE   Escitalopram Oxalate Other (See Comments)    Decreased sex drive   Topiramate Other (See Comments)    foggy    FAMILY HISTORY: Family History  Problem Relation Age of Onset   Hypertension Mother    Diabetes Mother    Irritable bowel syndrome Mother    Stroke Mother    Other Father        bile duct cancer   Liver cancer Father    Hypertension Maternal Grandmother    Diabetes Maternal Grandmother    Irritable bowel syndrome Maternal Aunt    Ovarian cancer Other    Breast cancer Neg Hx    BRCA 1/2 Neg Hx     SOCIAL HISTORY: Social History   Socioeconomic History   Marital status: Married    Spouse name: Not on file   Number of children: 0  Years of education: Not on file   Highest education level: Not on file  Occupational History   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  Tobacco Use   Smoking status: Never   Smokeless tobacco: Never  Vaping Use   Vaping status: Never Used  Substance and Sexual Activity   Alcohol use: Yes    Alcohol/week: 2.0 standard drinks of alcohol    Types: 2 Standard drinks or equivalent per week    Comment: rare   Drug use: No   Sexual activity: Yes    Partners: Male    Birth control/protection: Surgical    Comment: husband vasectomy/ Hysterectomy  Other Topics Concern   Not on file  Social History Narrative   Right handed    Lives with husband -2 story home, 10 steps   Caffeine 3 cup a day   Social Drivers of Corporate investment banker Strain: Not on file  Food Insecurity: Not on file  Transportation Needs: Not on file  Physical Activity: Not on file  Stress: Not on file  Social Connections: Not on file  Intimate Partner Violence: Not on file     PHYSICAL EXAM: Vitals:   08/05/24  1454  BP: 119/80  Pulse: 85  SpO2: 95%   General: No acute distress Head:  Normocephalic/atraumatic Skin/Extremities: No rash, no edema Neurological Exam: alert and awake. No aphasia or dysarthria. Fund of knowledge is appropriate.   Attention and concentration are normal.   Cranial nerves: Pupils equal, round. Extraocular movements intact with no nystagmus. Visual fields full.  No facial asymmetry.  Motor: Bulk and tone normal, muscle strength 5/5 throughout with no pronator drift.   Finger to nose testing intact.  Gait narrow-based and steady, able to tandem walk adequately.  Romberg negative.   IMPRESSION: This is a pleasant 48 yo RH woman with primary generalized epilepsy. EEG showed 3 Hz polyspike and wave discharges, MRI brain normal. She had significant worsening of depression and anxiety with sudden discontinuation of Lamotrigine  during EMU admission in 2021, symptoms improved with restarting Lamotrigine . She is doing good with no GTCs since 2017, refills sent for Zonisamide  500mg  at bedtime. Lamotrigine  prescribed by Psychiatry. She is aware of [place] driving laws to stop driving after a seizure until 6 months seizure-free. Follow-up in 1 year, call for any changes.   Thank you for allowing me to participate in her care.  Please do not hesitate to call for any questions or concerns.    Darice Shivers, M.D.   CC: Dr. Loretha

## 2024-08-05 NOTE — Patient Instructions (Signed)
 Good to see you doing well! Continue Zonisamide  100mg : take 5 capsules daily. Continue Lamotrigine  as prescribed by Psychiatry. Follow-up in 1 year, call for any changes.    Seizure Precautions: 1. If medication has been prescribed for you to prevent seizures, take it exactly as directed.  Do not stop taking the medicine without talking to your doctor first, even if you have not had a seizure in a long time.   2. Avoid activities in which a seizure would cause danger to yourself or to others.  Don't operate dangerous machinery, swim alone, or climb in high or dangerous places, such as on ladders, roofs, or girders.  Do not drive unless your doctor says you may.  3. If you have any warning that you may have a seizure, lay down in a safe place where you can't hurt yourself.    4.  No driving for 6 months from last seizure, as per [place]  state law.   Please refer to the following link on the Epilepsy Foundation of America's website for more information: http://www.epilepsyfoundation.org/answerplace/Social/driving/drivingu.cfm   5.  Maintain good sleep hygiene. Avoid alcohol.  6.  Contact your doctor if you have any problems that may be related to the medicine you are taking.  7.  Call 911 and bring the patient back to the ED if:        A.  The seizure lasts longer than 5 minutes.       B.  The patient doesn't awaken shortly after the seizure  C.  The patient has new problems such as difficulty seeing, speaking or moving  D.  The patient was injured during the seizure  E.  The patient has a temperature over 102 F (39C)  F.  The patient vomited and now is having trouble breathing

## 2024-08-11 ENCOUNTER — Other Ambulatory Visit: Payer: Self-pay

## 2024-08-11 ENCOUNTER — Encounter: Payer: Self-pay | Admitting: Gastroenterology

## 2024-08-11 MED ORDER — LINACLOTIDE 290 MCG PO CAPS: 290.0000 ug | ORAL_CAPSULE | Freq: Every day | ORAL | 3 refills | Status: DC

## 2024-08-17 ENCOUNTER — Ambulatory Visit: Admitting: Physical Therapy

## 2024-08-27 ENCOUNTER — Ambulatory Visit: Admitting: Family Medicine

## 2024-08-27 ENCOUNTER — Encounter: Payer: Self-pay | Admitting: Family Medicine

## 2024-08-27 VITALS — BP 118/72 | HR 73 | Temp 97.9°F | Ht 66.0 in | Wt 234.0 lb

## 2024-08-27 DIAGNOSIS — Z23 Encounter for immunization: Secondary | ICD-10-CM | POA: Diagnosis not present

## 2024-08-27 DIAGNOSIS — E282 Polycystic ovarian syndrome: Secondary | ICD-10-CM

## 2024-08-27 DIAGNOSIS — Z1159 Encounter for screening for other viral diseases: Secondary | ICD-10-CM | POA: Diagnosis not present

## 2024-08-27 DIAGNOSIS — G40309 Generalized idiopathic epilepsy and epileptic syndromes, not intractable, without status epilepticus: Secondary | ICD-10-CM

## 2024-08-27 DIAGNOSIS — G43009 Migraine without aura, not intractable, without status migrainosus: Secondary | ICD-10-CM | POA: Diagnosis not present

## 2024-08-27 DIAGNOSIS — Z0000 Encounter for general adult medical examination without abnormal findings: Secondary | ICD-10-CM | POA: Diagnosis not present

## 2024-08-27 DIAGNOSIS — F32A Depression, unspecified: Secondary | ICD-10-CM

## 2024-08-27 DIAGNOSIS — K5904 Chronic idiopathic constipation: Secondary | ICD-10-CM

## 2024-08-27 LAB — LIPID PANEL
Cholesterol: 205 mg/dL — ABNORMAL HIGH (ref 0–200)
HDL: 60.9 mg/dL (ref >39.00)
LDL Cholesterol: 118 mg/dL — ABNORMAL HIGH (ref 0–99)
NonHDL: 144.49
Total CHOL/HDL Ratio: 3
Triglycerides: 131 mg/dL (ref 0.0–149.0)
VLDL: 26.2 mg/dL (ref 0.0–40.0)

## 2024-08-27 LAB — HEMOGLOBIN A1C: Hgb A1c MFr Bld: 5.8 % (ref 4.6–6.5)

## 2024-08-27 NOTE — Progress Notes (Signed)
 Phone [phone]   Subjective:   Patient is a 48 y.o. female presenting for annual physical.    Chief Complaint  Patient presents with   New Patient (Initial Visit)    Est care    Discussed the use of AI scribe software for clinical note transcription with the patient, who gave verbal consent to proceed.  History of Present Illness April Campbell is a 48 year old female who presents for an annual physical exam. New pt  She has a history of endometriosis, for which she underwent YAG laser surgery and a hysterectomy, with preservation of her ovaries. She also has a history of umbilical hernia repair, left elbow surgery following a fracture, right carpal tunnel surgery, and left knee surgery about a year ago.  She has been experiencing depression, which has improved since starting Pristiq two months ago. She no longer takes trazodone due to adjusting to the insomnia side effect of Pristiq. She is under the care of a psychiatrist for her depression management.  For her seizures and migraines, she takes zonisamide  and lamotrigine . Migraines are not much of an issue anymore. She recently started Linzess  for constipation, a lifelong issue, and is evaluating its effectiveness.  She reports increased urination at night, a new symptom for her. She also experiences muscle aches and joint pains. Her current exercise routine includes walking her dogs for about 30 minutes.  Her family history includes her father's liver cancer related to alcoholism. She is married, has no children, and works as a film/video editor. She reports rare alcohol use, no drug use, and has never smoked.  She has a history of PCOS and is concerned about her family history of diabetes, although her blood work has consistently shown normal results. She is mindful of her diet and is working on increasing her physical activity.  She takes Pepcid  as needed for heartburn, which she finds effective. She is scheduled for a  colonoscopy next month and is up to date with her mammogram screenings.    See problem oriented charting- ROS- ROS: Gen: no fever, chills  Skin: no rash, itching ENT: no ear pain, ear drainage, nasal congestion, rhinorrhea, sinus pressure, sore throat Eyes: no blurry vision, double vision Resp: no cough, wheeze,SOB CV: no CP, palpitations, LE edema,  GI: no heartburn, n/v/d, abd pain GU: no dysuria, urgency, frequency, hematuria MSK: some Neuro: no dizziness, headache, weakness, vertigo Psych: better  The following were reviewed and entered/updated in epic: Past Medical History:  Diagnosis Date   Allergy    Anxiety    Depression    Endometriosis    GERD (gastroesophageal reflux disease)    History of migraine headaches    Seizures (HCC)    controlled with meds, last seizure 4-5 yrs ago   UTI (urinary tract infection)    Patient Active Problem List   Diagnosis Date Noted   Anxiety 12/14/2016   Endometriosis 04/25/2016   PCOS (polycystic ovarian syndrome) 03/19/2016   Epilepsy with partial complex seizures (HCC) 01/21/2016   Idiopathic generalized epilepsy (HCC) 12/06/2015   Migraine without aura and responsive to treatment 12/06/2015   Past Surgical History:  Procedure Laterality Date   ABDOMINAL HYSTERECTOMY  2017   CARPAL TUNNEL RELEASE Right 06/14/2015   Procedure: RIGHT CARPAL TUNNEL RELEASE ENDOSCOPIC;  Surgeon: Alm Hummer, MD;  Location: [facility];  Service: Orthopedics;  Laterality: Right;   COLONOSCOPY WITH PROPOFOL  N/A 02/10/2013   Procedure: COLONOSCOPY WITH PROPOFOL ;  Surgeon: Gladis MARLA Louder, MD;  Location: [facility];  Service: Endoscopy;  Laterality: N/A;   CYSTOSCOPY N/A 05/14/2016   Procedure: CYSTOSCOPY;  Surgeon: Ronal GORMAN Pinal, MD;  Location: [facility];  Service: Gynecology;  Laterality: N/A;   DIAGNOSTIC LAPAROSCOPY  12/15/2009   uterosacral nerve ablation   ELBOW SURGERY Left 10/29/2010   fell, fx   HERNIA REPAIR  1980    KNEE SURGERY Left    LAPAROSCOPIC BILATERAL SALPINGECTOMY Bilateral 05/14/2016   Procedure: LAPAROSCOPIC BILATERAL SALPINGECTOMY;  Surgeon: Ronal GORMAN Pinal, MD;  Location: [facility];  Service: Gynecology;  Laterality: Bilateral;   LAPAROSCOPIC HYSTERECTOMY N/A 05/14/2016   Procedure: HYSTERECTOMY TOTAL LAPAROSCOPIC;  Surgeon: Ronal GORMAN Pinal, MD;  Location: [facility];  Service: Gynecology;  Laterality: N/A;   UMBILICAL HERNIA REPAIR     as an infant   WISDOM TOOTH EXTRACTION     YAG LASER APPLICATION  06/24/2006   vaporization of endometriosis    Family History  Problem Relation Age of Onset   Hypertension Mother    Diabetes Mother    Irritable bowel syndrome Mother    Stroke Mother    Other Father        bile duct cancer   Liver cancer Father    Alcohol abuse Father    Cancer Father        liver   Irritable bowel syndrome Maternal Aunt    Hypertension Maternal Grandmother    Diabetes Maternal Grandmother    Arthritis Maternal Grandmother    Ovarian cancer Other    Breast cancer Neg Hx    BRCA 1/2 Neg Hx     Medications- reviewed and updated Current Outpatient Medications  Medication Sig Dispense Refill   cyclobenzaprine  (FLEXERIL ) 10 MG tablet TAKE 1 TABLET BY MOUTH AT BEDTIME AS NEEDED FOR MUSCLE SPASMS 30 tablet 0   estradiol  (ESTRING ) 7.5 MCG/24HR vaginal ring Place 1 each vaginally every 3 (three) months. 1 each 3   famotidine  (PEPCID ) 20 MG tablet Take 20 mg by mouth at bedtime.     ibuprofen  (ADVIL ,MOTRIN ) 200 MG tablet Take 800 mg by mouth as needed for headache or moderate pain.      Lactobacillus (PROBIOTIC ACIDOPHILUS PO) Take 1 capsule by mouth daily.      lamoTRIgine  (LAMICTAL ) 100 MG tablet Take 1/2 tab in AM, 1/2 tab in PM     linaclotide  (LINZESS ) 290 MCG CAPS capsule Take 1 capsule (290 mcg total) by mouth daily before breakfast. 30 capsule 3   Multiple Vitamins-Minerals (ONE-A-DAY WOMENS PO) Take 1 tablet by mouth daily.     Na Sulfate-K Sulfate-Mg Sulfate  concentrate (SUPREP) 17.5-3.13-1.6 GM/177ML SOLN SMARTSIG:1 Kit(s) By Mouth Once     Peppermint Oil (IBGARD) 90 MG CPCR Take 2 capsules as directed. 8 capsule 0   PRISTIQ 100 MG 24 hr tablet Take 100 mg by mouth daily.     zonisamide  (ZONEGRAN ) 100 MG capsule Take 5 capsules nightly 450 capsule 4   No current facility-administered medications for this visit.    Allergies-reviewed and updated Allergies  Allergen Reactions   Wellbutrin [Bupropion Hcl] Other (See Comments)    SEIZURE   Escitalopram Oxalate Other (See Comments)    Decreased sex drive   Topiramate Other (See Comments)    foggy    Social History   Social History Narrative   Right handed    Lives with husband -2 story home, 10 steps   Caffeine 3 cup a day   Objective  Objective:  BP 118/72   Pulse 73  Temp 97.9 F (36.6 C)   Ht 5' 6 (1.676 m)   Wt 234 lb (106.1 kg)   LMP 04/27/2016 (Approximate)   SpO2 98%   BMI 37.77 kg/m  Physical Exam  Gen: WDWN NAD HEENT: NCAT, conjunctiva not injected, sclera nonicteric TM WNL B, OP moist, no exudates  NECK:  supple, no thyromegaly, no nodes, no carotid bruits CARDIAC: RRR, S1S2+, no murmur. DP 2+B LUNGS: CTAB. No wheezes ABDOMEN:  BS+, soft, NTND, No HSM, no masses EXT:  no edema MSK: no gross abnormalities. MS 5/5 all 4 NEURO: A&O x3.  CN II-XII intact.  PSYCH: normal mood. Good eye contact     Assessment and Plan   Health Maintenance counseling: 1. Anticipatory guidance: Patient counseled regarding regular dental exams q6 months, eye exams,  avoiding smoking and second hand smoke, limiting alcohol to 1 beverage per day, no illicit drugs.   2. Risk factor reduction:  Advised patient of need for regular exercise and diet rich and fruits and vegetables to reduce risk of heart attack and stroke. Exercise- encouraged.  Wt Readings from Last 3 Encounters:  08/27/24 234 lb (106.1 kg)  08/05/24 235 lb (106.6 kg)  07/30/24 236 lb (107 kg)   3.  Immunizations/screenings/ancillary studies Immunization History  Administered Date(s) Administered   Fluzone Influenza virus vaccine,trivalent (IIV3), split virus 12/03/2019   Influenza, Seasonal, Injecte, Preservative Fre 08/27/2024   Influenza,inj,Quad PF,6+ Mos 07/26/2016, 07/29/2017, 12/03/2019   Influenza,inj,quad, With Preservative 06/24/2015   PFIZER(Purple Top)SARS-COV-2 Vaccination 01/23/2020, 02/16/2020   Tdap 03/18/2014, 08/27/2024   Health Maintenance Due  Topic Date Due   HIV Screening  Never done   Hepatitis C Screening  Never done   Hepatitis B Vaccines 19-59 Average Risk (1 of 3 - 19+ 3-dose series) Never done   Colonoscopy  02/11/2023    4. Cervical cancer screening- encouraged 5. Breast cancer screening-  mammogram sch 6. Colon cancer screening - sch 7. Skin cancer screening- advised regular sunscreen use. Denies worrisome, changing, or new skin lesions.  8. Birth control/STD check- n/a 9. Osteoporosis screening- n/a 10. Smoking associated screening - non smoker  Wellness examination -     Lipid panel -     Hemoglobin A1c -     Hepatitis C antibody -     HIV Antibody (routine testing w rflx)  Need for Tdap vaccination -     Tdap vaccine greater than or equal to 7yo IM  Encounter for immunization -     Flu vaccine trivalent PF, 6mos and older(Flulaval,Afluria,Fluarix,Fluzone)  Screening for viral disease -     Hepatitis C antibody -     HIV Antibody (routine testing w rflx)    Assessment and Plan Assessment & Plan  Depression   Her depression is improving with Pristiq, initiated two months ago. The insomnia side effect from trazodone has resolved. Continue Pristiq and psychiatric management.  Migraine and Seizure Disorder   Migraine is not currently an issue. Seizure disorder is managed with zonisamide  and lamotrigine . Continue current management with these medications.managed by neuro  Endometriosis and Polycystic Ovary Syndrome   She has  endometriosis with previous surgeries including YAG laser and hysterectomy. PCOS is present.  Chronic Constipation   Chronic constipation is a lifelong issue. She recently started Linzess  to evaluate its effectiveness. Evaluate the effectiveness of Linzess .  Perimenopausal Symptoms   She is experiencing perimenopausal symptoms including increased urination at night and general body changes, which are normal for her age.  General Health Maintenance  Her mammogram is up to date, and a colonoscopy is scheduled for next month. Tetanus and flu vaccinations are updated. Order blood work for cholesterol, A1c, and hepatitis C screening. Consider HIV screening. Ensure follow-up on lab results.  Obesity   She has obesity with a family history of diabetes, but her current blood sugar levels are normal. Check with insurance regarding coverage for weight loss medications. Implement lifestyle modifications including diet and exercise as discussed.     Recommended follow up: Return in about 1 year (around 08/27/2025) for annual physical.  Lab/Order associations:+ fasting  Jenkins CHRISTELLA Carrel, MD

## 2024-08-27 NOTE — Patient Instructions (Signed)
Welcome to [facility] at [facility]! It was a pleasure meeting you today. ° °As discussed, Please schedule a 12 month follow up visit today. ° °PLEASE NOTE: ° °If you had any LAB tests please let us know if you have not heard back within a few days. You may see your results on MyChart before we have a chance to review them but we will give you a call once they are reviewed by us. If we ordered any REFERRALS today, please let us know if you have not heard from their office within the next week.  °Let us know through MyChart if you are needing REFILLS, or have your pharmacy send us the request. You can also use MyChart to communicate with me or any office staff. ° °Please try these tips to maintain a healthy lifestyle: ° °Eat most of your calories during the day when you are active. Eliminate processed foods including packaged sweets (pies, cakes, cookies), reduce intake of potatoes, white bread, white pasta, and white rice. Look for whole grain options, oat flour or almond flour. ° °Each meal should contain half fruits/vegetables, one quarter protein, and one quarter carbs (no bigger than a computer mouse). ° °Cut down on sweet beverages. This includes juice, soda, and sweet tea. Also watch fruit intake, though this is a healthier sweet option, it still contains natural sugar! Limit to 3 servings daily. ° °Drink at least 1 glass of water with each meal and aim for at least 8 glasses per day ° °Exercise at least 150 minutes every week.   °

## 2024-08-28 ENCOUNTER — Encounter: Payer: Self-pay | Admitting: Gastroenterology

## 2024-08-28 LAB — HIV ANTIBODY (ROUTINE TESTING W REFLEX)
HIV 1&2 Ab, 4th Generation: NONREACTIVE
HIV FINAL INTERPRETATION: NEGATIVE

## 2024-08-28 LAB — HEPATITIS C ANTIBODY: Hepatitis C Ab: NONREACTIVE

## 2024-08-30 ENCOUNTER — Ambulatory Visit: Payer: Self-pay | Admitting: Family Medicine

## 2024-08-30 NOTE — Progress Notes (Signed)
 Hep C and HIV negative Cholesterol acceptable A1C(3 month average of sugars) is elevated.  This is considered PreDiabetes.  Work on diet-decrease sugars and starches and aim for 30 minutes of exercise 5 days/week to prevent progression to diabetes

## 2024-08-31 ENCOUNTER — Encounter (HOSPITAL_BASED_OUTPATIENT_CLINIC_OR_DEPARTMENT_OTHER): Payer: Self-pay | Admitting: Obstetrics & Gynecology

## 2024-08-31 ENCOUNTER — Encounter: Payer: Self-pay | Admitting: Physical Therapy

## 2024-08-31 ENCOUNTER — Ambulatory Visit: Attending: Obstetrics & Gynecology | Admitting: Physical Therapy

## 2024-08-31 DIAGNOSIS — M62838 Other muscle spasm: Secondary | ICD-10-CM | POA: Insufficient documentation

## 2024-08-31 DIAGNOSIS — R293 Abnormal posture: Secondary | ICD-10-CM | POA: Diagnosis present

## 2024-08-31 DIAGNOSIS — M6281 Muscle weakness (generalized): Secondary | ICD-10-CM | POA: Diagnosis present

## 2024-08-31 DIAGNOSIS — R279 Unspecified lack of coordination: Secondary | ICD-10-CM | POA: Diagnosis present

## 2024-08-31 NOTE — Progress Notes (Signed)
 Called pt left message for return call smk

## 2024-08-31 NOTE — Therapy (Signed)
 OUTPATIENT PHYSICAL THERAPY FEMALE PELVIC TREATMENT   Patient Name: April Campbell MRN: [id number] DOB:1976-09-19, 48 y.o., female Today's Date: 08/31/2024  END OF SESSION:  PT End of Session - 08/31/24 1405     Visit Number 2    Date for Recertification  01/12/25    Authorization Type BC[employer]    PT Start Time 1404    PT Stop Time 1442    PT Time Calculation (min) 38 min    Activity Tolerance Patient tolerated treatment well;No increased pain    Behavior During Therapy WFL for tasks assessed/performed          Past Medical History:  Diagnosis Date   Allergy    Anxiety    Depression    Endometriosis    GERD (gastroesophageal reflux disease)    History of migraine headaches    Seizures (HCC)    controlled with meds, last seizure 4-5 yrs ago   UTI (urinary tract infection)    Past Surgical History:  Procedure Laterality Date   ABDOMINAL HYSTERECTOMY  2017   CARPAL TUNNEL RELEASE Right 06/14/2015   Procedure: RIGHT CARPAL TUNNEL RELEASE ENDOSCOPIC;  Surgeon: Alm Hummer, MD;  Location: [facility];  Service: Orthopedics;  Laterality: Right;   COLONOSCOPY WITH PROPOFOL  N/A 02/10/2013   Procedure: COLONOSCOPY WITH PROPOFOL ;  Surgeon: Gladis MARLA Louder, MD;  Location: [facility];  Service: Endoscopy;  Laterality: N/A;   CYSTOSCOPY N/A 05/14/2016   Procedure: CYSTOSCOPY;  Surgeon: Ronal GORMAN Pinal, MD;  Location: [facility];  Service: Gynecology;  Laterality: N/A;   DIAGNOSTIC LAPAROSCOPY  12/15/2009   uterosacral nerve ablation   ELBOW SURGERY Left 10/29/2010   fell, fx   HERNIA REPAIR  1980   KNEE SURGERY Left    LAPAROSCOPIC BILATERAL SALPINGECTOMY Bilateral 05/14/2016   Procedure: LAPAROSCOPIC BILATERAL SALPINGECTOMY;  Surgeon: Ronal GORMAN Pinal, MD;  Location: [facility];  Service: Gynecology;  Laterality: Bilateral;   LAPAROSCOPIC HYSTERECTOMY N/A 05/14/2016   Procedure: HYSTERECTOMY TOTAL LAPAROSCOPIC;  Surgeon: Ronal GORMAN Pinal, MD;  Location: [facility];  Service:  Gynecology;  Laterality: N/A;   UMBILICAL HERNIA REPAIR     as an infant   WISDOM TOOTH EXTRACTION     YAG LASER APPLICATION  06/24/2006   vaporization of endometriosis   Patient Active Problem List   Diagnosis Date Noted   Anxiety 12/14/2016   Endometriosis 04/25/2016   PCOS (polycystic ovarian syndrome) 03/19/2016   Epilepsy with partial complex seizures (HCC) 01/21/2016   Idiopathic generalized epilepsy (HCC) 12/06/2015   Migraine without aura and responsive to treatment 12/06/2015    PCP: Rankins, Richerd SAUNDERS, MD  REFERRING PROVIDER: Pinal Ronal GORMAN, MD  REFERRING DIAG: M62.89 (ICD-10-CM) - Pelvic floor dysfunction in female   THERAPY DIAG:  Other muscle spasm  Muscle weakness (generalized)  Unspecified lack of coordination  Abnormal posture  Rationale for Evaluation and Treatment: Rehabilitation  ONSET DATE: chronic since 2000, worse in 2024  SUBJECTIVE:  SUBJECTIVE STATEMENT: Patient reports that she bought oh nuts but have not used them yet. Husband had a shoulder surgery. She is masturbating and still has some pain with that Has been struggling with depression and is on medication for it.  Feels like she sees impact on her libido. Vibrator hurts now, but less then before     From before Patient stats that she has had pain with intercourse for several years, did PT for it, found ways to manage it. It has some back, it is little bit more aggressive. Cannot find position where it is comfortable. Can be burning, sore. Has been exercising more, not sure if that has aggravated it. Peeing more frequently than she used to, drinking more water . Increased stress, getting older.  Has messed around with toys hurts where they stitched her up Hx of endo, hysterectomy 2017 Fluid intake:  water   FUNCTIONAL LIMITATIONS: intercourse  PERTINENT HISTORY:  Medications for current condition: estrogen cream, flexeril  Surgeries: hysterectomy 2017 Other:  Sexual abuse: No  DIAGNOSTIC FINDINGS:  Post-void residual: Voiding Cystourethrogram (VCUG):  Ultrasound: PAIN:  Are you having pain? Yes NPRS scale: 9-10/10 with intercourse Pain location: Internal  Pain type: burning Pain description: intermittent   Aggravating factors: intercourse Relieving factors: no intercourse  PRECAUTIONS: None  RED FLAGS: None   WEIGHT BEARING RESTRICTIONS: No  FALLS:  Has patient fallen in last 6 months? No  OCCUPATION: desk job- event organiser of agriculture  ACTIVITY LEVEL : some walks with dogs  PLOF: Independent  PATIENT GOALS: reduce pain with intercourse   BOWEL MOVEMENT:constipation most of the time Pain with bowel movement: Yes sometimes Type of bowel movement:Type (Bristol Stool Scale) 2-3 Fully empty rectum: Yes: most of the time Leakage: No                                                      Pads: No Fiber supplement/laxative Yes fiber, senokot  URINATION: no issues Leakage: sometimes with sleeping  INTERCOURSE:  Ability to have vaginal penetration Yes  Pain with intercourse: Initial Penetration, During Penetration, and After Intercourse Dryness: Yes - does not use vaginal estrogen Climax: yes Marinoff Scale: 1/3 Lubricant: yes  PREGNANCY: no children   PROLAPSE: None   OBJECTIVE:  Note: Objective measures were completed at Evaluation unless otherwise noted.  DIAGNOSTIC FINDINGS:    PATIENT SURVEYS:    PFIQ-7: 14  COGNITION: Overall cognitive status: Within functional limits for tasks assessed     SENSATION: Light touch: Appears intact  LUMBAR SPECIAL TESTS:  Single leg stance test: Negative  FUNCTIONAL TESTS:   Single leg stance:  Mu:cjohld  Lt: valgus Sit-up test: 1/4 Squat:1/2   GAIT: Assistive device utilized:  None  POSTURE: rounded shoulders, forward head, increased lumbar lordosis, and anterior pelvic tilt   LUMBARAROM/PROM: within functional limitations  LOWER EXTREMITY ROM: within functional limitations   LOWER EXTREMITY MMT: 4/5 overall PALPATION:  General: tenderness around inferior rami pubic symphysis  Pelvic Alignment: even  Abdominal: no tenderness around laparoscopic scars  Diastasis: No Distortion: No  Breathing: upper chest, braces abdomen Scar tissue: Yes:                  External Perineal Exam: mild dryness present  Internal Pelvic Floor: vaginal canal narrow but no tenderness present with palpation- just around pubic rami, difficulty with lengthening/ bulging  Patient confirms identification and approves PT to assess internal pelvic floor and treatment Yes  PELVIC MMT:   MMT eval  Vaginal 5/5, 5 sec hold  Internal Anal Sphincter   External Anal Sphincter   Puborectalis   Diastasis Recti no  (Blank rows = not tested)        TONE: high  PROLAPSE: mild anterior vaginal wall laxity with bulging  TODAY'S TREATMENT:                                                                                                                              DATE:  08/31/2024 Review of progress  Child's pose 15 breaths Supine LTR 10 reps Supine QL stretch 10 bilateral Diaphragmatic breathing throughout for downtraining Butterfly stretch 15 breaths Single knee to chest 10 breaths bilat  Education on moisturizers, fiber, squatty potty for constipation  HEP printed today    07/15/2024  EVAL  Examination completed, findings reviewed, pt educated on POC, HEP, and female pelvic floor anatomy, reasoning with pelvic floor assessment internally with pt consent. Pt motivated to participate in PT and agreeable to attempt recommendations.     PATIENT EDUCATION:  Education details: Pt was educated on relevant anatomy, exam findings, home exercise program,  plan of care, expectations of PT and ohnut. Educated patient on dilator protocol as well, hadout given, she will use a vibrator to start with  [place] educated: Patient Education method: Explanation, Demonstration, Tactile cues, Verbal cues, and Handouts Education comprehension: verbalized understanding, returned demonstration, verbal cues required, tactile cues required, and needs further education  HOME EXERCISE PROGRAM: Access Code: [email or web address] URL: [email or web address] Date: 08/31/2024 Prepared by: Cori Karenann Mcgrory  Exercises - Supine Butterfly Groin Stretch  - 1 x daily - 7 x weekly - 2 sets - 10 reps - Supine Lower Trunk Rotation  - 1 x daily - 7 x weekly - 2 sets - 10 reps - Diaphragmatic Breathing in Child's Pose with Pelvic Floor Relaxation  - 1 x daily - 7 x weekly - 2 sets - 10 reps - Butterfly Groin Stretch  - 1 x daily - 7 x weekly - 2 sets - 10 reps - Supine Pelvic Floor Stretch - Hands on Knees  - 1 x daily - 7 x weekly - 2 sets - 10 reps - Pelvic Floor Lengthening in Hooklying  - 1 x daily - 7 x weekly - 2 sets - 10 reps - Deep Squat with Pelvic Floor Relaxation  - 1 x daily - 7 x weekly - 2 sets - 10 reps - Diaphragmatic Breathing at 90/90 Supported  - 1 x daily - 7 x weekly - 2 sets - 10 reps  Patient Education - Abdominal Massage for Constipation - High-Fiber Diet to Support Pelvic Health - Abdominal Massage for Constipation - Bowel Emptying Techniques  ASSESSMENT:  CLINICAL IMPRESSION: Patient was seen today for treatment of dyspareunia. Patient with high tone pelvic floor. Patient did well with exercises for down training, and education today. We discussed progress, lubricants, constipation strategies and recommended increased fiber and cont consistency with vaginal lubricant and pelvic floor down training. Treatment session focused on stretches and pelvic floor lengthening with diaphragmatic breathing  to reduce high tone. Patient had no difficulty  with her stretches. Patient is progressing slowly towards goals and will benefit from continued PT to address deficits, reduce dyspareunia and improve quality of life.     From eval Patient is a 48 y.o. F who was seen today for physical therapy evaluation and treatment for dyspareunia. Exam findings are notable for upper chest breathing strategies, abdominal bracing, pelvic floor muscle increased tone, high tone in pelvic floor. External soft tissues of pelvic floor appear dry. Patient demonstrates good trunk mobility, bilateral hip mobility, tender and weak abdominal muscles, trigger points in glutes and lumbar paraspinals, reduced range or motion in pelvic floor with bulging and pain with intercourse. It is difficult for patient to participate in intercourse  due to chronic pain . Discussed findings with patient, educated patient on using ohnuts and HEP was initiated. Patient's quality of life has been affected, patient will benefit from physical therapy to address deficits, reduce pain and improve constipation and leaking urine at night and quality of life.    OBJECTIVE IMPAIRMENTS: decreased coordination, decreased knowledge of condition, decreased ROM, increased fascial restrictions, increased muscle spasms, impaired tone, obesity, and pain.   ACTIVITY LIMITATIONS: continence, toileting, and intercourse  PARTICIPATION LIMITATIONS: intercourse  PERSONAL FACTORS: Past/current experiences and Time since onset of injury/illness/exacerbation are also affecting patient's functional outcome.   REHAB POTENTIAL: Good  CLINICAL DECISION MAKING: Evolving/moderate complexity  EVALUATION COMPLEXITY: Moderate   GOALS: Goals reviewed with patient? Yes  SHORT TERM GOALS: Target date: 08/12/2024    Patient will be educated on healthy bowel PT recommendations Baseline: Goal status: met 11/3  2.  Patient will be I with dilator use Baseline:  Goal status: met 11/3  3.  Patient will be I with  her HEP Baseline:  Goal status: INITIAL   LONG TERM GOALS: Target date: 01/12/2025  Patient will report max 2/10 pain with intercourse with husband Baseline:  Goal status: INITIAL  2.  Patient will report regular bowel movements Baseline:  Goal status: INITIAL  3.  Patient will not leak at night Baseline:  Goal status: INITIAL  4.  Patient will be I with advanced HEP Baseline:  Goal status: INITIAL    PLAN:  PT FREQUENCY: 1-2x/week  PT DURATION: 6 months  PLANNED INTERVENTIONS: 97110-Therapeutic exercises, 97530- Therapeutic activity, 97112- Neuromuscular re-education, 97535- Self Care, 02859- Manual therapy, [phone]- Electrical stimulation (manual), [phone] (1-2 muscles), [phone] (3+ muscles)- Dry Needling, Patient/Family education, Taping, Joint mobilization, Joint manipulation, Spinal manipulation, Spinal mobilization, Scar mobilization, Cryotherapy, Moist heat, and Biofeedback  PLAN FOR NEXT SESSION: education on dilator protocol, exercises for diaphragmatic breathing, downtraining, abdominal massage, constipation strategies, will reach out to Dr Cleotilde for vaginal estrogen   Denora Wysocki, PT 08/31/2024, 2:05 PM

## 2024-09-01 NOTE — Telephone Encounter (Signed)
 Copied from CRM #[id number]. Topic: Clinical - Lab/Test Results >> Sep 01, 2024  9:43 AM Rea ORN wrote: Reason for CRM: Pt returned missed call from [facility]. I went over lab results with pt.

## 2024-09-03 ENCOUNTER — Encounter: Admitting: Gastroenterology

## 2024-09-04 ENCOUNTER — Ambulatory Visit (AMBULATORY_SURGERY_CENTER): Admitting: Gastroenterology

## 2024-09-04 ENCOUNTER — Encounter: Payer: Self-pay | Admitting: Gastroenterology

## 2024-09-04 VITALS — BP 141/96 | HR 71 | Temp 97.3°F | Resp 17 | Ht 66.0 in | Wt 236.0 lb

## 2024-09-04 DIAGNOSIS — Z83719 Family history of colon polyps, unspecified: Secondary | ICD-10-CM

## 2024-09-04 DIAGNOSIS — D123 Benign neoplasm of transverse colon: Secondary | ICD-10-CM

## 2024-09-04 DIAGNOSIS — K635 Polyp of colon: Secondary | ICD-10-CM

## 2024-09-04 DIAGNOSIS — Z1211 Encounter for screening for malignant neoplasm of colon: Secondary | ICD-10-CM

## 2024-09-04 MED ORDER — SODIUM CHLORIDE 0.9 % IV SOLN: 500.0000 mL | Freq: Once | INTRAVENOUS | Status: DC

## 2024-09-04 NOTE — Progress Notes (Signed)
 History and Physical:  This patient presents for endoscopic testing for: Encounter Diagnoses  Name Primary?   Screening for colon cancer Yes   Family history of colonic polyps     48 year old woman here today for screening colonoscopy and family history of colon polyps. She was seen in the office 07/30/2024 regarding chronic abdominal pain and constipation, something for which she had been previously evaluated at our office.  Linzess  was prescribed.  No significant clinical changes since that visit. Reports Linzess  not working very well  Patient is otherwise without complaints or active issues today.   Past Medical History: Past Medical History:  Diagnosis Date   Allergy    Anxiety    Depression    Endometriosis    GERD (gastroesophageal reflux disease)    History of migraine headaches    Seizures (HCC)    controlled with meds, last seizure 4-5 yrs ago   UTI (urinary tract infection)      Past Surgical History: Past Surgical History:  Procedure Laterality Date   ABDOMINAL HYSTERECTOMY  2017   CARPAL TUNNEL RELEASE Right 06/14/2015   Procedure: RIGHT CARPAL TUNNEL RELEASE ENDOSCOPIC;  Surgeon: Alm Hummer, MD;  Location: [facility];  Service: Orthopedics;  Laterality: Right;   COLONOSCOPY WITH PROPOFOL  N/A 02/10/2013   Procedure: COLONOSCOPY WITH PROPOFOL ;  Surgeon: Gladis MARLA Louder, MD;  Location: [facility];  Service: Endoscopy;  Laterality: N/A;   CYSTOSCOPY N/A 05/14/2016   Procedure: CYSTOSCOPY;  Surgeon: Ronal GORMAN Pinal, MD;  Location: [facility];  Service: Gynecology;  Laterality: N/A;   DIAGNOSTIC LAPAROSCOPY  12/15/2009   uterosacral nerve ablation   ELBOW SURGERY Left 10/29/2010   fell, fx   HERNIA REPAIR  1980   KNEE SURGERY Left    LAPAROSCOPIC BILATERAL SALPINGECTOMY Bilateral 05/14/2016   Procedure: LAPAROSCOPIC BILATERAL SALPINGECTOMY;  Surgeon: Ronal GORMAN Pinal, MD;  Location: [facility];  Service: Gynecology;  Laterality: Bilateral;   LAPAROSCOPIC  HYSTERECTOMY N/A 05/14/2016   Procedure: HYSTERECTOMY TOTAL LAPAROSCOPIC;  Surgeon: Ronal GORMAN Pinal, MD;  Location: [facility];  Service: Gynecology;  Laterality: N/A;   UMBILICAL HERNIA REPAIR     as an infant   WISDOM TOOTH EXTRACTION     YAG LASER APPLICATION  06/24/2006   vaporization of endometriosis    Allergies: Allergies  Allergen Reactions   Wellbutrin [Bupropion Hcl] Other (See Comments)    SEIZURE   Escitalopram Oxalate Other (See Comments)    Decreased sex drive   Topiramate Other (See Comments)    foggy    Outpatient Meds: Current Outpatient Medications  Medication Sig Dispense Refill   ibuprofen  (ADVIL ,MOTRIN ) 200 MG tablet Take 800 mg by mouth as needed for headache or moderate pain.      Lactobacillus (PROBIOTIC ACIDOPHILUS PO) Take 1 capsule by mouth daily.      lamoTRIgine  (LAMICTAL ) 100 MG tablet Take 1/2 tab in AM, 1/2 tab in PM     linaclotide  (LINZESS ) 290 MCG CAPS capsule Take 1 capsule (290 mcg total) by mouth daily before breakfast. 30 capsule 3   Multiple Vitamins-Minerals (ONE-A-DAY WOMENS PO) Take 1 tablet by mouth daily.     PRISTIQ 100 MG 24 hr tablet Take 100 mg by mouth daily.     zonisamide  (ZONEGRAN ) 100 MG capsule Take 5 capsules nightly 450 capsule 4   cyclobenzaprine  (FLEXERIL ) 10 MG tablet TAKE 1 TABLET BY MOUTH AT BEDTIME AS NEEDED FOR MUSCLE SPASMS 30 tablet 0   estradiol  (ESTRING ) 7.5 MCG/24HR vaginal ring Place 1  each vaginally every 3 (three) months. 1 each 3   famotidine  (PEPCID ) 20 MG tablet Take 20 mg by mouth at bedtime.     Peppermint Oil (IBGARD) 90 MG CPCR Take 2 capsules as directed. 8 capsule 0   Current Facility-Administered Medications  Medication Dose Route Frequency Provider Last Rate Last Admin   0.9 %  sodium chloride  infusion  500 mL Intravenous Once Danis, Victory CROME III, MD          ___________________________________________________________________ Objective   Exam:  BP 134/79   Pulse 74   Temp (!) 97.3 F (36.3 C)  (Skin)   Ht 5' 6 (1.676 m)   Wt 236 lb (107 kg)   LMP 04/27/2016 (Approximate)   SpO2 98%   BMI 38.09 kg/m   CV: regular , S1/S2 Resp: clear to auscultation bilaterally, normal RR and effort noted GI: soft, no tenderness, with active bowel sounds.   Assessment: Encounter Diagnoses  Name Primary?   Screening for colon cancer Yes   Family history of colonic polyps      Plan: Colonoscopy   The benefits and risks of the planned procedure(s) were described in detail with the patient or (when appropriate) their health care proxy.  Risks were outlined as including, but not limited to, bleeding, infection, perforation, adverse medication reaction leading to cardiac or pulmonary decompensation, pancreatitis (if ERCP).  The limitation of incomplete mucosal visualization was also discussed.  No guarantees or warranties were given.  The patient was provided an opportunity to ask questions and all were answered. The patient agreed with the plan.   The patient is appropriate for an endoscopic procedure in the ambulatory setting.   - Victory Brand, MD

## 2024-09-04 NOTE — Patient Instructions (Addendum)
 Resume previous diet Continue present medications  Await pathology results  See handout on polyps  YOU HAD AN ENDOSCOPIC PROCEDURE TODAY AT THE [facility]:   Refer to the procedure report that was given to you for any specific questions about what was found during the examination.  If the procedure report does not answer your questions, please call your gastroenterologist to clarify.  If you requested that your care partner not be given the details of your procedure findings, then the procedure report has been included in a sealed envelope for you to review at your convenience later.  YOU SHOULD EXPECT: Some feelings of bloating in the abdomen. Passage of more gas than usual.  Walking can help get rid of the air that was put into your GI tract during the procedure and reduce the bloating. If you had a lower endoscopy (such as a colonoscopy or flexible sigmoidoscopy) you may notice spotting of blood in your stool or on the toilet paper. If you underwent a bowel prep for your procedure, you may not have a normal bowel movement for a few days.  Please Note:  You might notice some irritation and congestion in your nose or some drainage.  This is from the oxygen used during your procedure.  There is no need for concern and it should clear up in a day or so.  SYMPTOMS TO REPORT IMMEDIATELY: Following lower endoscopy (colonoscopy or flexible sigmoidoscopy):  Excessive amounts of blood in the stool  Significant tenderness or worsening of abdominal pains  Swelling of the abdomen that is new, acute  Fever of 100F or higher  For urgent or emergent issues, a gastroenterologist can be reached at any hour by calling [phone]. Do not use MyChart messaging for urgent concerns.   DIET:  We do recommend a small meal at first, but then you may proceed to your regular diet.  Drink plenty of fluids but you should avoid alcoholic beverages for 24 hours.  ACTIVITY:  You should plan to take it  easy for the rest of today and you should NOT DRIVE or use heavy machinery until tomorrow (because of the sedation medicines used during the test).    FOLLOW UP: Our staff will call the number listed on your records the next business day following your procedure.  We will call around 7:15- 8:00 am to check on you and address any questions or concerns that you may have regarding the information given to you following your procedure. If we do not reach you, we will leave a message.     If any biopsies were taken you will be contacted by phone or by letter within the next 1-3 weeks.  Please call us  at [phone] if you have not heard about the biopsies in 3 weeks.   SIGNATURES/CONFIDENTIALITY: You and/or your care partner have signed paperwork which will be entered into your electronic medical record.  These signatures attest to the fact that that the information above on your After Visit Summary has been reviewed and is understood.  Full responsibility of the confidentiality of this discharge information lies with you and/or your care-partner.

## 2024-09-04 NOTE — Progress Notes (Signed)
 Report given to PACU, vss

## 2024-09-04 NOTE — Op Note (Signed)
 [facility] Patient Name: April Campbell Procedure Date: 09/04/2024 2:10 PM MRN: [id number] Endoscopist: Victory L. Legrand , MD, [id number] Age: 48 Referring MD:  Date of Birth: [birth year] Gender: Female Account #: [id number] Procedure:                Colonoscopy Indications:              Screening for colorectal malignant neoplasm, This                            is the patient's first colonoscopy, Incidental                            constipation noted Medicines:                Monitored Anesthesia Care Procedure:                Pre-Anesthesia Assessment:                           - Prior to the procedure, a History and Physical                            was performed, and patient medications and                            allergies were reviewed. The patient's tolerance of                            previous anesthesia was also reviewed. The risks                            and benefits of the procedure and the sedation                            options and risks were discussed with the patient.                            All questions were answered, and informed consent                            was obtained. Prior Anticoagulants: The patient has                            taken no anticoagulant or antiplatelet agents. ASA                            Grade Assessment: III - A patient with severe                            systemic disease. After reviewing the risks and                            benefits, the patient was deemed in satisfactory  condition to undergo the procedure.                           After obtaining informed consent, the colonoscope                            was passed under direct vision. Throughout the                            procedure, the patient's blood pressure, pulse, and                            oxygen saturations were monitored continuously. The                            CF [id number] #[id number] was introduced  through the anus                            and advanced to the the cecum, identified by                            appendiceal orifice and ileocecal valve. The                            colonoscopy was performed without difficulty. The                            patient tolerated the procedure well (800 mg                            propofol ). The quality of the bowel preparation was                            adequate after extensive lavage (with some                            scattered debris). The ileocecal valve, appendiceal                            orifice, and rectum were photographed. The bowel                            preparation used was SUPREP. Scope In: 2:23:38 PM Scope Out: 2:53:02 PM Scope Withdrawal Time: 0 hours 24 minutes 41 seconds  Total Procedure Duration: 0 hours 29 minutes 24 seconds  Findings:                 The perianal and digital rectal examinations were                            normal.                           Repeat examination of right colon under NBI  performed.                           A 5 mm polyp was found in the transverse colon. The                            polyp was flat. The polyp was removed with a cold                            snare. Resection and retrieval were complete.                           The exam was otherwise without abnormality on                            direct and retroflexion views. Complications:            No immediate complications. Estimated Blood Loss:     Estimated blood loss was minimal. Impression:               - One 5 mm polyp in the transverse colon, removed                            with a cold snare. Resected and retrieved.                           - The examination was otherwise normal on direct                            and retroflexion views. Recommendation:           - Patient has a contact number available for                            emergencies. The signs and  symptoms of potential                            delayed complications were discussed with the                            patient. Return to normal activities tomorrow.                            Written discharge instructions were provided to the                            patient.                           - Resume previous diet.                           - Continue present medications.                           - Await pathology results.                           -  Repeat colonoscopy in 3 years for surveillance.                            (Regardless of pathology result, see prep details                            above) -PEG prep for next procedure                           - Return to physician assistant at the next                            available appointment for ongoing management of                            constipation. Wynter Grave L. Legrand, MD 09/04/2024 2:58:00 PM This report has been signed electronically.

## 2024-09-07 ENCOUNTER — Telehealth: Payer: Self-pay

## 2024-09-07 NOTE — Telephone Encounter (Signed)
  Follow up Call-     09/04/2024    1:02 PM  Call back number  Post procedure Call Back phone  # [phone]  Permission to leave phone message Yes     Patient questions:  Do you have a fever, pain , or abdominal swelling? No. Pain Score  0 *  Have you tolerated food without any problems? Yes.    Have you been able to return to your normal activities? Yes.    Do you have any questions about your discharge instructions: Diet   No. Medications  No. Follow up visit  No.  Do you have questions or concerns about your Care? No.  Actions: * If pain score is 4 or above: No action needed, pain <4.

## 2024-09-09 LAB — SURGICAL PATHOLOGY

## 2024-09-13 ENCOUNTER — Ambulatory Visit: Payer: Self-pay | Admitting: Gastroenterology

## 2024-09-14 ENCOUNTER — Encounter: Admitting: Physical Therapy

## 2024-09-15 ENCOUNTER — Encounter: Payer: Self-pay | Admitting: Neurology

## 2024-09-28 ENCOUNTER — Encounter: Admitting: Physical Therapy

## 2024-10-02 ENCOUNTER — Encounter: Payer: Self-pay | Admitting: Gastroenterology

## 2024-10-02 ENCOUNTER — Ambulatory Visit: Admitting: Gastroenterology

## 2024-10-02 VITALS — BP 110/70 | HR 85 | Ht 67.0 in | Wt 239.0 lb

## 2024-10-02 DIAGNOSIS — K5909 Other constipation: Secondary | ICD-10-CM

## 2024-10-02 MED ORDER — TRULANCE 3 MG PO TABS: 1.0000 | ORAL_TABLET | Freq: Every day | ORAL | 0 refills | Status: AC

## 2024-10-02 NOTE — Patient Instructions (Addendum)
 We have given you samples of the following medication to take: Trulance    Thank you for trusting me with your gastrointestinal care!    Dr. Victory Legrand [facility]

## 2024-10-02 NOTE — Progress Notes (Signed)
 [facility] Progress Note  Chief Complaint: Chronic constipation  Subjective  Prior history  APP clinic visit October 2025 for chronic constipation, details in that note.  High-dose Linzess  had caused urgency and loose stool, trial of 145 mcg daily was given. Occasional episodes of heartburn or cough, question reflux, given a 6-week trial once daily omeprazole   09/14/2024 colonoscopy complete to cecum, adequate prep after extensive lavage.  Diminutive hyperplastic polyp.  3-year recall recommended due to prep (GoLytely for next exam)   History of Present Illness  April Campbell reports that her constipation is still variable.  Some weeks she may have a BM every day, then sometimes not for several days.  Linzess  at different doses initially worked but then seem to lose its effect.  Currently trying to maintain bowel regularity with high-fiber diet and plenty of water  and activity. We discussed her recent colonoscopy findings She uncommonly experiences heartburn  ROS: Cardiovascular:  no chest pain Respiratory: no dyspnea  The patient's Past Medical, Family and Social History were reviewed and are on file in the EMR. Past Medical History:  Diagnosis Date   Allergy    Anxiety    Depression    Endometriosis    GERD (gastroesophageal reflux disease)    History of migraine headaches    Seizures (HCC)    controlled with meds, last seizure 4-5 yrs ago   UTI (urinary tract infection)     Past Surgical History:  Procedure Laterality Date   ABDOMINAL HYSTERECTOMY  2017   CARPAL TUNNEL RELEASE Right 06/14/2015   Procedure: RIGHT CARPAL TUNNEL RELEASE ENDOSCOPIC;  Surgeon: Alm Hummer, MD;  Location: [facility];  Service: Orthopedics;  Laterality: Right;   COLONOSCOPY WITH PROPOFOL  N/A 02/10/2013   Procedure: COLONOSCOPY WITH PROPOFOL ;  Surgeon: Gladis MARLA Louder, MD;  Location: [facility];  Service: Endoscopy;  Laterality: N/A;   CYSTOSCOPY N/A 05/14/2016    Procedure: CYSTOSCOPY;  Surgeon: Ronal GORMAN Pinal, MD;  Location: [facility];  Service: Gynecology;  Laterality: N/A;   DIAGNOSTIC LAPAROSCOPY  12/15/2009   uterosacral nerve ablation   ELBOW SURGERY Left 10/29/2010   fell, fx   HERNIA REPAIR  1980   KNEE SURGERY Left    LAPAROSCOPIC BILATERAL SALPINGECTOMY Bilateral 05/14/2016   Procedure: LAPAROSCOPIC BILATERAL SALPINGECTOMY;  Surgeon: Ronal GORMAN Pinal, MD;  Location: [facility];  Service: Gynecology;  Laterality: Bilateral;   LAPAROSCOPIC HYSTERECTOMY N/A 05/14/2016   Procedure: HYSTERECTOMY TOTAL LAPAROSCOPIC;  Surgeon: Ronal GORMAN Pinal, MD;  Location: [facility];  Service: Gynecology;  Laterality: N/A;   UMBILICAL HERNIA REPAIR     as an infant   WISDOM TOOTH EXTRACTION     YAG LASER APPLICATION  06/24/2006   vaporization of endometriosis     Objective:  Med list reviewed  Current Outpatient Medications:    cyclobenzaprine  (FLEXERIL ) 10 MG tablet, TAKE 1 TABLET BY MOUTH AT BEDTIME AS NEEDED FOR MUSCLE SPASMS, Disp: 30 tablet, Rfl: 0   famotidine  (PEPCID ) 20 MG tablet, Take 20 mg by mouth at bedtime., Disp: , Rfl:    ibuprofen  (ADVIL ,MOTRIN ) 200 MG tablet, Take 800 mg by mouth as needed for headache or moderate pain. , Disp: , Rfl:    Lactobacillus (PROBIOTIC ACIDOPHILUS PO), Take 1 capsule by mouth daily. , Disp: , Rfl:    lamoTRIgine  (LAMICTAL ) 100 MG tablet, Take 1/2 tab in AM, 1/2 tab in PM, Disp: , Rfl:    Multiple Vitamins-Minerals (ONE-A-DAY WOMENS PO), Take 1 tablet by mouth daily., Disp: ,  Rfl:    Peppermint Oil (IBGARD) 90 MG CPCR, Take 2 capsules as directed., Disp: 8 capsule, Rfl: 0   Plecanatide  (TRULANCE ) 3 MG TABS, Take 1 tablet (3 mg total) by mouth daily., Disp: 12 tablet, Rfl: 0   PRISTIQ 100 MG 24 hr tablet, Take 100 mg by mouth daily., Disp: , Rfl:    zonisamide  (ZONEGRAN ) 100 MG capsule, Take 5 capsules nightly, Disp: 450 capsule, Rfl: 4   Vital signs in last 24 hrs: Vitals:   10/02/24 0940  BP: 110/70  Pulse: 85   Wt  Readings from Last 3 Encounters:  10/02/24 239 lb (108.4 kg)  09/04/24 236 lb (107 kg)  08/27/24 234 lb (106.1 kg)    Physical Exam  Well-appearing No additional exam Entire visit spent in review of symptoms results and plan   Labs:   ___________________________________________ Radiologic studies:   ____________________________________________ Other:   _____________________________________________   Encounter Diagnosis  Name Primary?   Chronic constipation Yes    Assessment & Plan  Constipation related to slow transit/motility.  She does not describe symptoms that sound like outlet dysfunction/rectocele.  Lifelong constipation that she recalls since childhood.  No obstruction on colonoscopy.  We described some available pro secretory and promotility medications and she would like to try something else.  Medicine trials may be limited by formulary and other cost issues as well as possible side effects and variable efficacy.  Plan: Trulance  3 mg once daily try daily for the next 7 to 10 days at least.  Based on that result, we can then determine whether or not to continue it regularly or periodically with constipation develops. She will update us  with a portal message in a couple of weeks. Return to clinic to see PA as needed  20 minutes were spent on this encounter (including chart review, history/exam, counseling/coordination of care, and documentation) > 50% of that time was spent on counseling and coordination of care.   Victory LITTIE Brand III

## 2024-11-09 ENCOUNTER — Telehealth: Payer: Self-pay | Admitting: Family Medicine

## 2024-11-09 NOTE — Telephone Encounter (Signed)
 LVM to reschedule pt's 09/01/25 appt since the Provider will no longer be in office that day

## 2025-08-09 ENCOUNTER — Ambulatory Visit: Admitting: Neurology

## 2025-09-01 ENCOUNTER — Encounter: Admitting: Family Medicine
